# Patient Record
Sex: Male | Born: 1960
Health system: Southern US, Community
[De-identification: ages and names within clinical notes are randomized; demographics above are authoritative.]

## PROBLEM LIST (undated history)

## (undated) DIAGNOSIS — R0602 Shortness of breath: Secondary | ICD-10-CM

## (undated) DIAGNOSIS — E785 Hyperlipidemia, unspecified: Secondary | ICD-10-CM

## (undated) DIAGNOSIS — T8859XA Other complications of anesthesia, initial encounter: Secondary | ICD-10-CM

## (undated) DIAGNOSIS — N189 Chronic kidney disease, unspecified: Secondary | ICD-10-CM

## (undated) DIAGNOSIS — C8590 Non-Hodgkin lymphoma, unspecified, unspecified site: Secondary | ICD-10-CM

## (undated) DIAGNOSIS — F419 Anxiety disorder, unspecified: Secondary | ICD-10-CM

## (undated) DIAGNOSIS — D649 Anemia, unspecified: Secondary | ICD-10-CM

## (undated) DIAGNOSIS — C801 Malignant (primary) neoplasm, unspecified: Secondary | ICD-10-CM

## (undated) DIAGNOSIS — K219 Gastro-esophageal reflux disease without esophagitis: Secondary | ICD-10-CM

## (undated) DIAGNOSIS — F40240 Claustrophobia: Secondary | ICD-10-CM

## (undated) DIAGNOSIS — G4730 Sleep apnea, unspecified: Secondary | ICD-10-CM

## (undated) DIAGNOSIS — I509 Heart failure, unspecified: Secondary | ICD-10-CM

## (undated) DIAGNOSIS — I5022 Chronic systolic (congestive) heart failure: Secondary | ICD-10-CM

## (undated) DIAGNOSIS — E042 Nontoxic multinodular goiter: Secondary | ICD-10-CM

## (undated) DIAGNOSIS — I10 Essential (primary) hypertension: Secondary | ICD-10-CM

## (undated) DIAGNOSIS — I428 Other cardiomyopathies: Secondary | ICD-10-CM

## (undated) DIAGNOSIS — M1990 Unspecified osteoarthritis, unspecified site: Secondary | ICD-10-CM

## (undated) HISTORY — PX: WISDOM TOOTH EXTRACTION: SHX21

## (undated) SURGERY — Surgical Case
Anesthesia: *Unknown

---

## 2001-03-22 DIAGNOSIS — I509 Heart failure, unspecified: Secondary | ICD-10-CM | POA: Insufficient documentation

## 2001-03-22 HISTORY — DX: Heart failure, unspecified: I50.9

## 2001-12-01 ENCOUNTER — Encounter: Payer: Self-pay | Admitting: Internal Medicine

## 2001-12-01 ENCOUNTER — Encounter: Admission: RE | Admit: 2001-12-01 | Discharge: 2001-12-01 | Payer: Self-pay | Admitting: Internal Medicine

## 2001-12-11 ENCOUNTER — Encounter: Payer: Self-pay | Admitting: Internal Medicine

## 2001-12-11 ENCOUNTER — Encounter: Admission: RE | Admit: 2001-12-11 | Discharge: 2001-12-11 | Payer: Self-pay | Admitting: Internal Medicine

## 2002-01-23 ENCOUNTER — Ambulatory Visit (HOSPITAL_COMMUNITY): Admission: RE | Admit: 2002-01-23 | Discharge: 2002-01-23 | Payer: Self-pay | Admitting: *Deleted

## 2002-03-05 ENCOUNTER — Ambulatory Visit (HOSPITAL_BASED_OUTPATIENT_CLINIC_OR_DEPARTMENT_OTHER): Admission: RE | Admit: 2002-03-05 | Discharge: 2002-03-05 | Payer: Self-pay | Admitting: Pulmonary Disease

## 2002-03-19 ENCOUNTER — Encounter (HOSPITAL_COMMUNITY): Admission: RE | Admit: 2002-03-19 | Discharge: 2002-06-17 | Payer: Self-pay | Admitting: Internal Medicine

## 2002-03-20 ENCOUNTER — Encounter: Payer: Self-pay | Admitting: Internal Medicine

## 2002-03-26 ENCOUNTER — Encounter: Admission: RE | Admit: 2002-03-26 | Discharge: 2002-06-24 | Payer: Self-pay | Admitting: Internal Medicine

## 2002-04-10 ENCOUNTER — Encounter: Admission: RE | Admit: 2002-04-10 | Discharge: 2002-04-10 | Payer: Self-pay | Admitting: Internal Medicine

## 2002-04-10 ENCOUNTER — Encounter: Payer: Self-pay | Admitting: Internal Medicine

## 2002-05-08 ENCOUNTER — Encounter (INDEPENDENT_AMBULATORY_CARE_PROVIDER_SITE_OTHER): Payer: Self-pay | Admitting: *Deleted

## 2002-05-08 ENCOUNTER — Encounter: Payer: Self-pay | Admitting: Endocrinology

## 2002-05-08 ENCOUNTER — Ambulatory Visit (HOSPITAL_COMMUNITY): Admission: RE | Admit: 2002-05-08 | Discharge: 2002-05-08 | Payer: Self-pay | Admitting: Endocrinology

## 2002-10-22 ENCOUNTER — Encounter: Payer: Self-pay | Admitting: Endocrinology

## 2002-10-22 ENCOUNTER — Ambulatory Visit (HOSPITAL_COMMUNITY): Admission: RE | Admit: 2002-10-22 | Discharge: 2002-10-22 | Payer: Self-pay | Admitting: Endocrinology

## 2005-09-06 ENCOUNTER — Encounter: Admission: RE | Admit: 2005-09-06 | Discharge: 2005-09-06 | Payer: Self-pay | Admitting: Internal Medicine

## 2005-09-10 ENCOUNTER — Ambulatory Visit (HOSPITAL_COMMUNITY): Admission: RE | Admit: 2005-09-10 | Discharge: 2005-09-10 | Payer: Self-pay | Admitting: Gastroenterology

## 2005-09-10 ENCOUNTER — Encounter (INDEPENDENT_AMBULATORY_CARE_PROVIDER_SITE_OTHER): Payer: Self-pay | Admitting: *Deleted

## 2005-09-16 ENCOUNTER — Ambulatory Visit: Payer: Self-pay | Admitting: Hematology and Oncology

## 2005-09-24 LAB — CBC WITH DIFFERENTIAL/PLATELET
EOS%: 3.6 % (ref 0.0–7.0)
LYMPH%: 16.4 % (ref 14.0–48.0)
MCH: 26 pg — ABNORMAL LOW (ref 28.0–33.4)
MCHC: 33.2 g/dL (ref 32.0–35.9)
MCV: 78.2 fL — ABNORMAL LOW (ref 81.6–98.0)
MONO%: 15.1 % — ABNORMAL HIGH (ref 0.0–13.0)
Platelets: 450 10*3/uL — ABNORMAL HIGH (ref 145–400)
RBC: 4.2 10*6/uL (ref 4.20–5.71)
RDW: 13.7 % (ref 11.2–14.6)

## 2005-09-24 LAB — COMPREHENSIVE METABOLIC PANEL
AST: 17 U/L (ref 0–37)
Albumin: 4.3 g/dL (ref 3.5–5.2)
Alkaline Phosphatase: 37 U/L — ABNORMAL LOW (ref 39–117)
BUN: 40 mg/dL — ABNORMAL HIGH (ref 6–23)
Potassium: 5.7 mEq/L — ABNORMAL HIGH (ref 3.5–5.3)
Sodium: 139 mEq/L (ref 135–145)
Total Bilirubin: 0.3 mg/dL (ref 0.3–1.2)
Total Protein: 7.8 g/dL (ref 6.0–8.3)

## 2005-09-24 LAB — LACTATE DEHYDROGENASE: LDH: 453 U/L — ABNORMAL HIGH (ref 94–250)

## 2005-09-30 ENCOUNTER — Ambulatory Visit (HOSPITAL_COMMUNITY): Admission: RE | Admit: 2005-09-30 | Discharge: 2005-09-30 | Payer: Self-pay | Admitting: Hematology and Oncology

## 2005-10-01 ENCOUNTER — Other Ambulatory Visit: Admission: RE | Admit: 2005-10-01 | Discharge: 2005-10-01 | Payer: Self-pay | Admitting: Hematology and Oncology

## 2005-10-01 ENCOUNTER — Encounter: Payer: Self-pay | Admitting: Hematology and Oncology

## 2005-10-01 ENCOUNTER — Encounter (INDEPENDENT_AMBULATORY_CARE_PROVIDER_SITE_OTHER): Payer: Self-pay | Admitting: Specialist

## 2005-10-02 LAB — HIV ANTIBODY (ROUTINE TESTING W REFLEX)

## 2005-10-02 LAB — URIC ACID: Uric Acid, Serum: 9.4 mg/dL — ABNORMAL HIGH (ref 2.4–7.0)

## 2005-10-04 ENCOUNTER — Ambulatory Visit (HOSPITAL_COMMUNITY): Admission: RE | Admit: 2005-10-04 | Discharge: 2005-10-04 | Payer: Self-pay | Admitting: Urology

## 2005-10-05 ENCOUNTER — Encounter (INDEPENDENT_AMBULATORY_CARE_PROVIDER_SITE_OTHER): Payer: Self-pay | Admitting: Cardiology

## 2005-10-05 ENCOUNTER — Ambulatory Visit: Admission: RE | Admit: 2005-10-05 | Discharge: 2005-10-05 | Payer: Self-pay | Admitting: Hematology and Oncology

## 2005-10-06 ENCOUNTER — Encounter (INDEPENDENT_AMBULATORY_CARE_PROVIDER_SITE_OTHER): Payer: Self-pay | Admitting: Specialist

## 2005-10-06 ENCOUNTER — Ambulatory Visit (HOSPITAL_COMMUNITY): Admission: RE | Admit: 2005-10-06 | Discharge: 2005-10-06 | Payer: Self-pay | Admitting: Hematology and Oncology

## 2005-10-06 ENCOUNTER — Encounter: Payer: Self-pay | Admitting: Hematology and Oncology

## 2005-10-08 ENCOUNTER — Ambulatory Visit (HOSPITAL_COMMUNITY): Admission: RE | Admit: 2005-10-08 | Discharge: 2005-10-08 | Payer: Self-pay | Admitting: Urology

## 2005-10-08 LAB — COMPREHENSIVE METABOLIC PANEL
ALT: 18 U/L (ref 0–40)
AST: 19 U/L (ref 0–37)
Albumin: 3.9 g/dL (ref 3.5–5.2)
BUN: 17 mg/dL (ref 6–23)
CO2: 22 mEq/L (ref 19–32)
Calcium: 9.8 mg/dL (ref 8.4–10.5)
Chloride: 99 mEq/L (ref 96–112)
Creatinine, Ser: 1.31 mg/dL (ref 0.40–1.50)
Potassium: 4.9 mEq/L (ref 3.5–5.3)

## 2005-10-08 LAB — CBC WITH DIFFERENTIAL/PLATELET
Basophils Absolute: 0 10*3/uL (ref 0.0–0.1)
EOS%: 1.8 % (ref 0.0–7.0)
HCT: 30.8 % — ABNORMAL LOW (ref 38.7–49.9)
HGB: 10.2 g/dL — ABNORMAL LOW (ref 13.0–17.1)
MONO#: 0.9 10*3/uL (ref 0.1–0.9)
NEUT#: 5.7 10*3/uL (ref 1.5–6.5)
NEUT%: 72.8 % (ref 40.0–75.0)
RDW: 14.2 % (ref 11.2–14.6)
WBC: 7.9 10*3/uL (ref 4.0–10.0)
lymph#: 1 10*3/uL (ref 0.9–3.3)

## 2005-10-08 LAB — LACTATE DEHYDROGENASE: LDH: 439 U/L — ABNORMAL HIGH (ref 94–250)

## 2005-11-01 ENCOUNTER — Ambulatory Visit: Payer: Self-pay | Admitting: Hematology and Oncology

## 2005-11-01 LAB — CBC WITH DIFFERENTIAL/PLATELET
Basophils Absolute: 0 10*3/uL (ref 0.0–0.1)
Eosinophils Absolute: 0.1 10*3/uL (ref 0.0–0.5)
HCT: 28.3 % — ABNORMAL LOW (ref 38.7–49.9)
HGB: 9.3 g/dL — ABNORMAL LOW (ref 13.0–17.1)
MCH: 25.4 pg — ABNORMAL LOW (ref 28.0–33.4)
MCV: 76.8 fL — ABNORMAL LOW (ref 81.6–98.0)
NEUT#: 8.7 10*3/uL — ABNORMAL HIGH (ref 1.5–6.5)
NEUT%: 74.3 % (ref 40.0–75.0)
RDW: 15.3 % — ABNORMAL HIGH (ref 11.2–14.6)
lymph#: 1.8 10*3/uL (ref 0.9–3.3)

## 2005-11-01 LAB — COMPREHENSIVE METABOLIC PANEL
Albumin: 4 g/dL (ref 3.5–5.2)
Alkaline Phosphatase: 65 U/L (ref 39–117)
BUN: 12 mg/dL (ref 6–23)
CO2: 24 mEq/L (ref 19–32)
Calcium: 8.6 mg/dL (ref 8.4–10.5)
Chloride: 105 mEq/L (ref 96–112)
Glucose, Bld: 127 mg/dL — ABNORMAL HIGH (ref 70–99)
Potassium: 4.2 mEq/L (ref 3.5–5.3)
Sodium: 138 mEq/L (ref 135–145)
Total Protein: 6.8 g/dL (ref 6.0–8.3)

## 2005-11-23 LAB — COMPREHENSIVE METABOLIC PANEL
AST: 17 U/L (ref 0–37)
Albumin: 4.2 g/dL (ref 3.5–5.2)
Alkaline Phosphatase: 65 U/L (ref 39–117)
BUN: 11 mg/dL (ref 6–23)
Calcium: 9 mg/dL (ref 8.4–10.5)
Chloride: 108 mEq/L (ref 96–112)
Glucose, Bld: 145 mg/dL — ABNORMAL HIGH (ref 70–99)
Potassium: 3.9 mEq/L (ref 3.5–5.3)
Sodium: 141 mEq/L (ref 135–145)
Total Protein: 6.9 g/dL (ref 6.0–8.3)

## 2005-11-23 LAB — CBC WITH DIFFERENTIAL/PLATELET
Basophils Absolute: 0.1 10*3/uL (ref 0.0–0.1)
Eosinophils Absolute: 0.1 10*3/uL (ref 0.0–0.5)
HGB: 9.3 g/dL — ABNORMAL LOW (ref 13.0–17.1)
NEUT#: 5.4 10*3/uL (ref 1.5–6.5)
RBC: 3.51 10*6/uL — ABNORMAL LOW (ref 4.20–5.71)
RDW: 18.2 % — ABNORMAL HIGH (ref 11.2–14.6)
WBC: 8.1 10*3/uL (ref 4.0–10.0)
lymph#: 1.5 10*3/uL (ref 0.9–3.3)

## 2005-12-06 LAB — CBC WITH DIFFERENTIAL/PLATELET
Basophils Absolute: 0 10*3/uL (ref 0.0–0.1)
EOS%: 0.2 % (ref 0.0–7.0)
HGB: 9.8 g/dL — ABNORMAL LOW (ref 13.0–17.1)
MCH: 26.7 pg — ABNORMAL LOW (ref 28.0–33.4)
MCV: 79.3 fL — ABNORMAL LOW (ref 81.6–98.0)
MONO%: 1.8 % (ref 0.0–13.0)
NEUT#: 11.9 10*3/uL — ABNORMAL HIGH (ref 1.5–6.5)
RBC: 3.65 10*6/uL — ABNORMAL LOW (ref 4.20–5.71)
RDW: 21.8 % — ABNORMAL HIGH (ref 11.2–14.6)
lymph#: 0.9 10*3/uL (ref 0.9–3.3)

## 2005-12-06 LAB — BASIC METABOLIC PANEL
Chloride: 104 mEq/L (ref 96–112)
Potassium: 5 mEq/L (ref 3.5–5.3)
Sodium: 137 mEq/L (ref 135–145)

## 2005-12-06 LAB — MAGNESIUM: Magnesium: 2.2 mg/dL (ref 1.5–2.5)

## 2005-12-13 LAB — CBC WITH DIFFERENTIAL/PLATELET
BASO%: 0.6 % (ref 0.0–2.0)
Basophils Absolute: 0 10*3/uL (ref 0.0–0.1)
Eosinophils Absolute: 0.1 10*3/uL (ref 0.0–0.5)
HCT: 29 % — ABNORMAL LOW (ref 38.7–49.9)
HGB: 9.6 g/dL — ABNORMAL LOW (ref 13.0–17.1)
LYMPH%: 25.6 % (ref 14.0–48.0)
MCHC: 33.3 g/dL (ref 32.0–35.9)
MONO#: 1.1 10*3/uL — ABNORMAL HIGH (ref 0.1–0.9)
NEUT%: 54.1 % (ref 40.0–75.0)
Platelets: 192 10*3/uL (ref 145–400)
WBC: 5.9 10*3/uL (ref 4.0–10.0)

## 2005-12-13 LAB — BASIC METABOLIC PANEL
BUN: 20 mg/dL (ref 6–23)
CO2: 26 mEq/L (ref 19–32)
Calcium: 9.4 mg/dL (ref 8.4–10.5)
Creatinine, Ser: 1.35 mg/dL (ref 0.40–1.50)
Glucose, Bld: 102 mg/dL — ABNORMAL HIGH (ref 70–99)

## 2005-12-22 ENCOUNTER — Ambulatory Visit: Payer: Self-pay | Admitting: Hematology and Oncology

## 2006-01-03 LAB — COMPREHENSIVE METABOLIC PANEL
ALT: 20 U/L (ref 0–40)
AST: 16 U/L (ref 0–37)
BUN: 16 mg/dL (ref 6–23)
Calcium: 8.8 mg/dL (ref 8.4–10.5)
Chloride: 105 mEq/L (ref 96–112)
Creatinine, Ser: 1.45 mg/dL (ref 0.40–1.50)
Total Bilirubin: 0.2 mg/dL — ABNORMAL LOW (ref 0.3–1.2)

## 2006-01-03 LAB — MAGNESIUM: Magnesium: 1.4 mg/dL — ABNORMAL LOW (ref 1.5–2.5)

## 2006-01-21 LAB — BASIC METABOLIC PANEL
CO2: 21 mEq/L (ref 19–32)
Potassium: 4.8 mEq/L (ref 3.5–5.3)
Sodium: 134 mEq/L — ABNORMAL LOW (ref 135–145)

## 2006-01-21 LAB — URINALYSIS, MICROSCOPIC - CHCC
Ketones: NEGATIVE mg/dL
Protein: 30 mg/dL
pH: 6 (ref 4.6–8.0)

## 2006-01-24 LAB — CBC WITH DIFFERENTIAL/PLATELET
HCT: 25.9 % — ABNORMAL LOW (ref 38.7–49.9)
MONO#: 0.8 10*3/uL (ref 0.1–0.9)
MONO%: 15.9 % — ABNORMAL HIGH (ref 0.0–13.0)
NEUT%: 60.3 % (ref 40.0–75.0)
Platelets: 107 10*3/uL — ABNORMAL LOW (ref 145–400)
RBC: 3 10*6/uL — ABNORMAL LOW (ref 4.20–5.71)
WBC: 4.7 10*3/uL (ref 4.0–10.0)
lymph#: 1.1 10*3/uL (ref 0.9–3.3)

## 2006-01-24 LAB — BASIC METABOLIC PANEL
BUN: 15 mg/dL (ref 6–23)
Calcium: 8.8 mg/dL (ref 8.4–10.5)
Creatinine, Ser: 1.43 mg/dL (ref 0.40–1.50)

## 2006-01-24 LAB — URINE SODIUM & POTASSIUM: Potassium Urine: 24 mEq/L

## 2006-02-02 ENCOUNTER — Ambulatory Visit: Payer: Self-pay | Admitting: Hematology and Oncology

## 2006-02-07 LAB — CBC WITH DIFFERENTIAL/PLATELET
BASO%: 0.1 % (ref 0.0–2.0)
Basophils Absolute: 0 10*3/uL (ref 0.0–0.1)
HCT: 28.2 % — ABNORMAL LOW (ref 38.7–49.9)
LYMPH%: 9.3 % — ABNORMAL LOW (ref 14.0–48.0)
MCH: 29.6 pg (ref 28.0–33.4)
MCV: 87.9 fL (ref 81.6–98.0)
MONO#: 0.2 10*3/uL (ref 0.1–0.9)
MONO%: 1.8 % (ref 0.0–13.0)
NEUT%: 88.6 % — ABNORMAL HIGH (ref 40.0–75.0)

## 2006-02-07 LAB — BASIC METABOLIC PANEL
CO2: 17 mEq/L — ABNORMAL LOW (ref 19–32)
Glucose, Bld: 129 mg/dL — ABNORMAL HIGH (ref 70–99)
Potassium: 4.7 mEq/L (ref 3.5–5.3)
Sodium: 135 mEq/L (ref 135–145)

## 2006-02-11 LAB — CBC WITH DIFFERENTIAL/PLATELET
BASO%: 0.5 % (ref 0.0–2.0)
Basophils Absolute: 0 10*3/uL (ref 0.0–0.1)
EOS%: 0.3 % (ref 0.0–7.0)
HCT: 26.5 % — ABNORMAL LOW (ref 38.7–49.9)
HGB: 8.9 g/dL — ABNORMAL LOW (ref 13.0–17.1)
LYMPH%: 14.1 % (ref 14.0–48.0)
MCH: 29.4 pg (ref 28.0–33.4)
MCHC: 33.5 g/dL (ref 32.0–35.9)
MCV: 87.9 fL (ref 81.6–98.0)
NEUT%: 75.2 % — ABNORMAL HIGH (ref 40.0–75.0)
Platelets: 131 10*3/uL — ABNORMAL LOW (ref 145–400)

## 2006-02-21 LAB — CBC WITH DIFFERENTIAL/PLATELET
Basophils Absolute: 0 10*3/uL (ref 0.0–0.1)
MCH: 30.4 pg (ref 28.0–33.4)
MCV: 90.4 fL (ref 81.6–98.0)
MONO%: 15.2 % — ABNORMAL HIGH (ref 0.0–13.0)
NEUT%: 70.3 % (ref 40.0–75.0)
RBC: 2.95 10*6/uL — ABNORMAL LOW (ref 4.20–5.71)
lymph#: 1 10*3/uL (ref 0.9–3.3)

## 2006-02-21 LAB — BASIC METABOLIC PANEL
BUN: 21 mg/dL (ref 6–23)
Chloride: 104 mEq/L (ref 96–112)
Glucose, Bld: 114 mg/dL — ABNORMAL HIGH (ref 70–99)

## 2006-04-06 ENCOUNTER — Ambulatory Visit: Payer: Self-pay | Admitting: Hematology and Oncology

## 2006-07-04 ENCOUNTER — Ambulatory Visit: Payer: Self-pay | Admitting: Hematology and Oncology

## 2006-10-12 ENCOUNTER — Ambulatory Visit: Payer: Self-pay | Admitting: Hematology and Oncology

## 2006-10-20 ENCOUNTER — Ambulatory Visit (HOSPITAL_COMMUNITY): Admission: RE | Admit: 2006-10-20 | Discharge: 2006-10-20 | Payer: Self-pay | Admitting: Internal Medicine

## 2006-12-30 ENCOUNTER — Ambulatory Visit: Payer: Self-pay | Admitting: Hematology and Oncology

## 2007-02-21 ENCOUNTER — Ambulatory Visit: Payer: Self-pay | Admitting: Hematology and Oncology

## 2007-06-06 ENCOUNTER — Ambulatory Visit: Payer: Self-pay | Admitting: Hematology and Oncology

## 2007-07-17 ENCOUNTER — Ambulatory Visit: Payer: Self-pay | Admitting: Hematology and Oncology

## 2007-08-28 ENCOUNTER — Ambulatory Visit: Payer: Self-pay | Admitting: Hematology and Oncology

## 2007-10-09 ENCOUNTER — Ambulatory Visit: Payer: Self-pay | Admitting: Hematology and Oncology

## 2007-10-10 IMAGING — CT CT CHEST W/ CM
1 of 12 series · 12 of 37 positions shown, 15 images · IV contrast (omnipaque)
Comparison: None
COMPARISON: None
COMPARISON: Today's neck and chest CT. CT of the abdomen and pelvis from
09/06/2005.

CLINICAL DATA: B-cell lymphoma

NECK CT WITH CONTRAST
TECHNIQUE: Multidetector CT imaging of the neck was performed following the
standard protocol during administration of intravenous contrast.
Contrast:  125 cc Omnipaque 300
TECHNIQUE: Multidetector CT imaging of the chest was performed following the
standard protocol during bolus administration of intravenous contrast.
TECHNIQUE: 17.6 mCi F-18 FDG was injected intravenously via the right
antecubital fossa .  Full-ring PET imaging was performed from the skull base
through the mid-thighs 60 minutes after injection.  CT data was obtained and
used for attenuation correction and anatomic localization only.  (This was not
acquired as a diagnostic CT examination.)

[Series 8: thin sctions 2.0 b40f st · axial · 0.86mm/px · z∈[-536,-197]mm · 12 of 573 slices shown, 15 images]
[im 45/573  mediastinal]
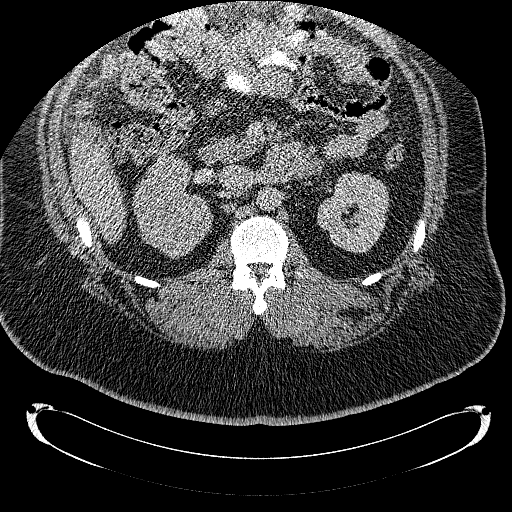
[im 45/573  lung]
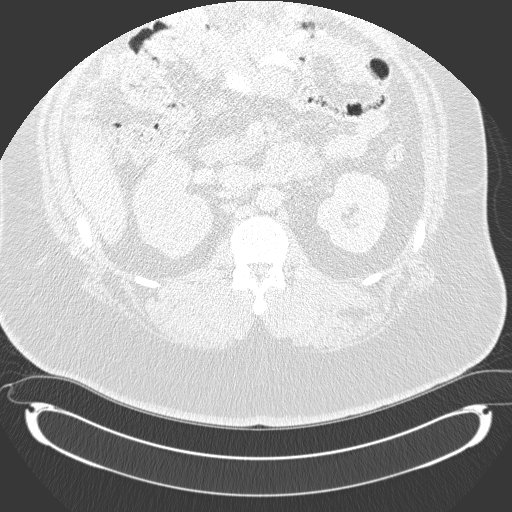
[im 89/573  lung]
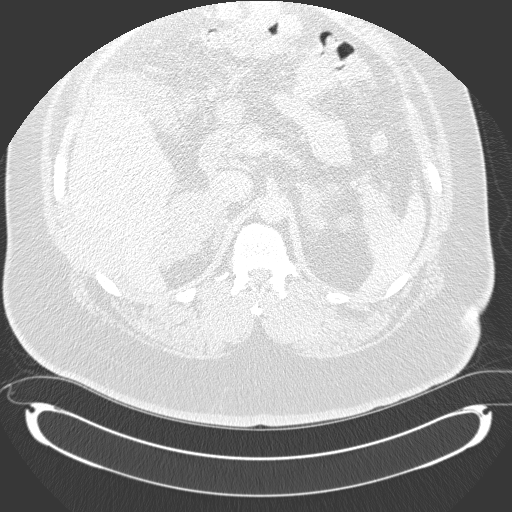
[im 133/573  lung]
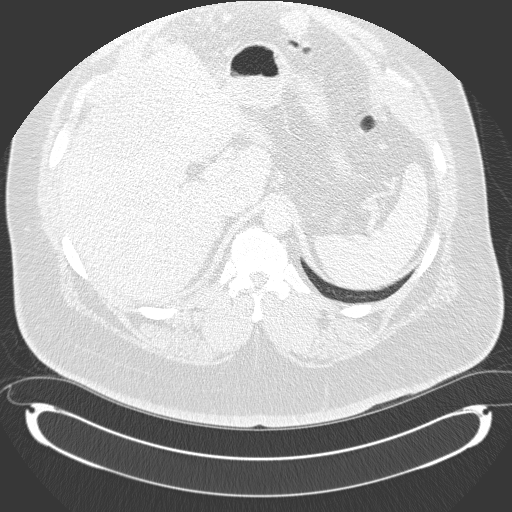
[im 177/573  lung]
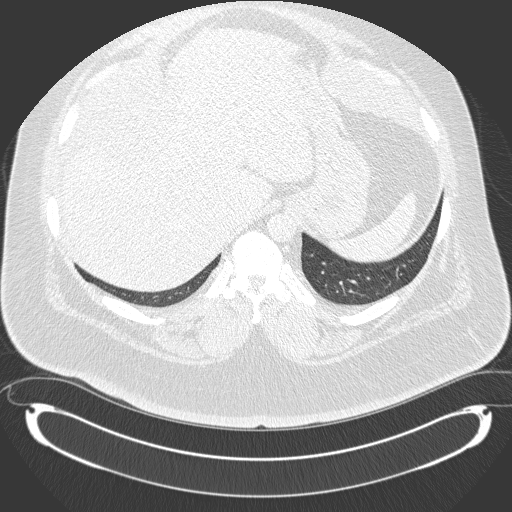
[im 221/573  mediastinal]
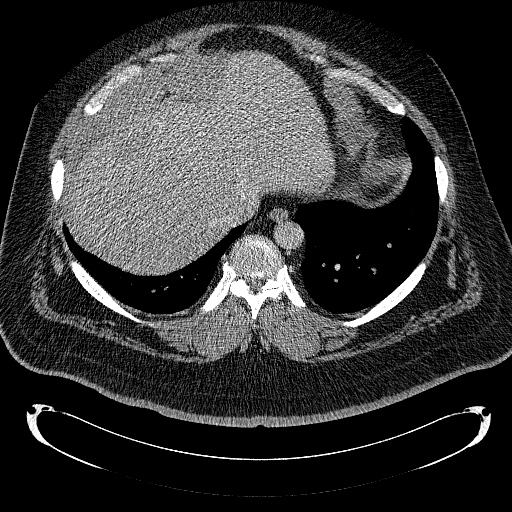
[im 221/573  lung]
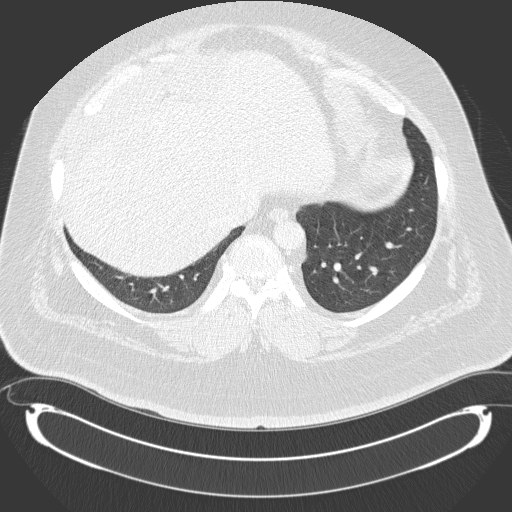
[im 265/573  lung]
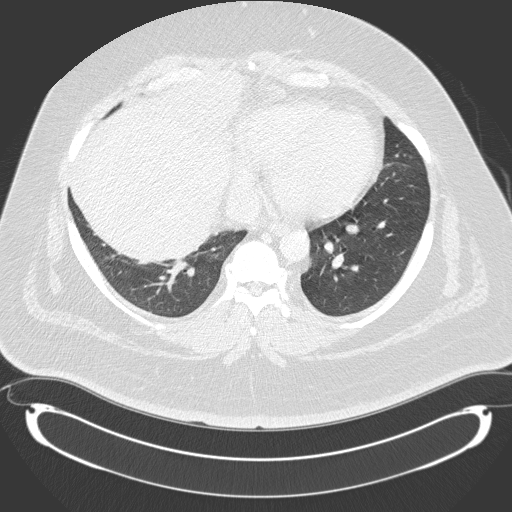
[im 309/573  lung]
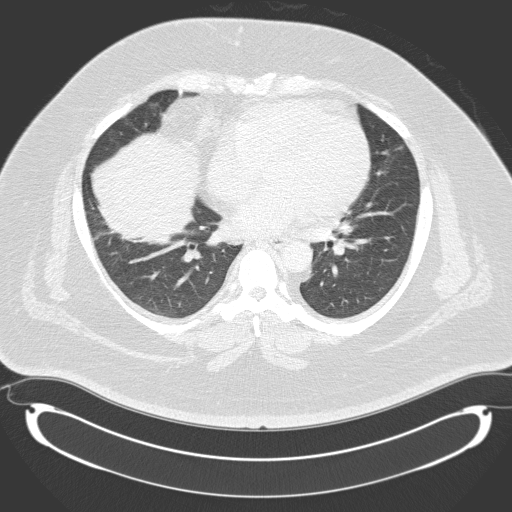
[im 353/573  lung]
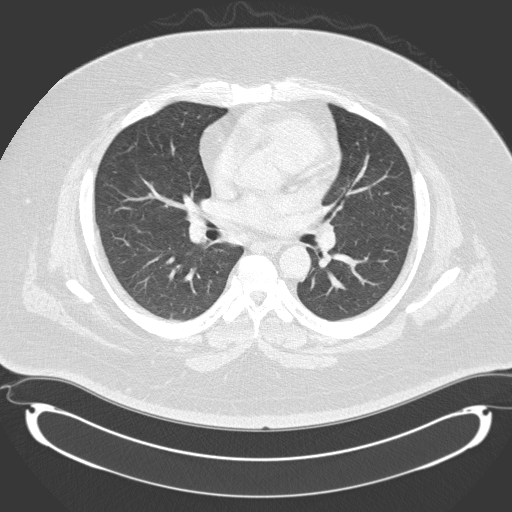
[im 397/573  mediastinal]
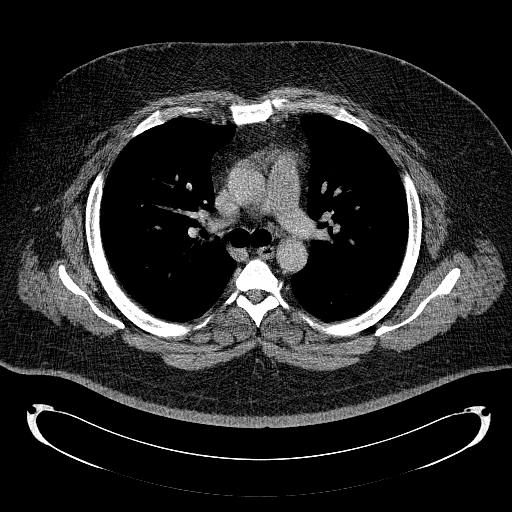
[im 397/573  lung]
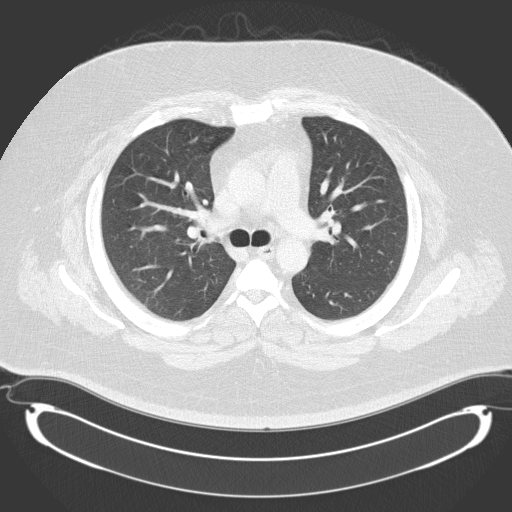
[im 441/573  lung]
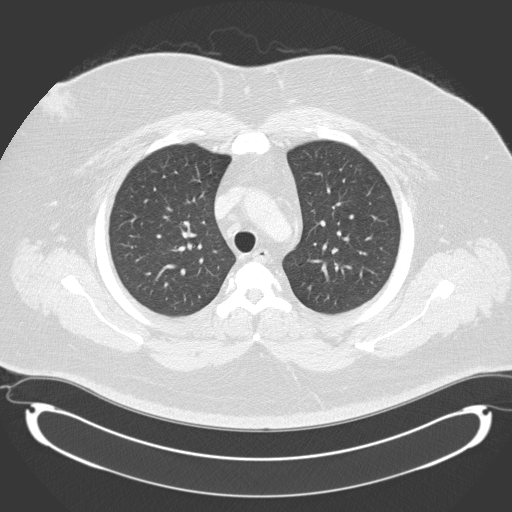
[im 485/573  lung]
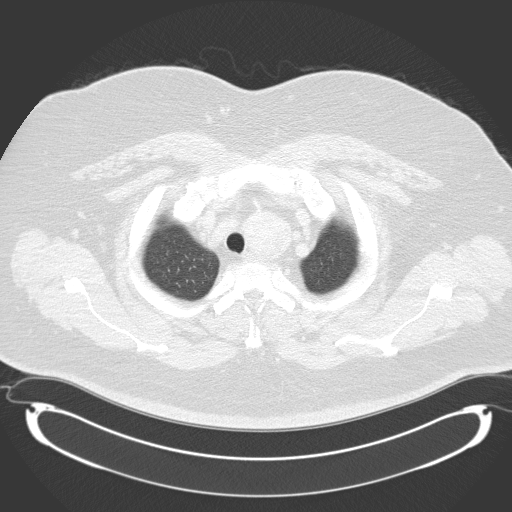
[im 529/573  lung]
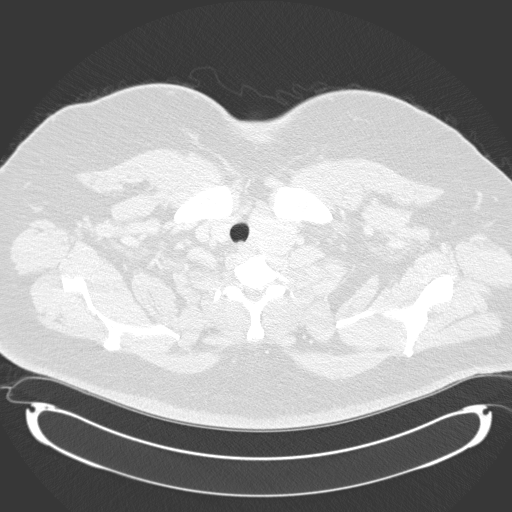

[12 of 37 positions shown; findings below may reference images not displayed]

FINDINGS: There are small subcentimeteric lymph node scattered throughout the
neck. These showed no FDG accumulation on today at PET-CT. No adenopathy. The
left thyroid lobe is enlarged and extends substernally. The left thyroid lobe
appears very heterogeneous and there may be small nodules present. See PET-CT
report below for further discussion.

There is mild mucosal disease in the maxillary sinuses bilaterally. No air-fluid
levels present. Vascular structures unremarkable. Visualized skeletal structures
unremarkable.

IMPRESSION

No evidence of cervical adenopathy.

Enlarged left lobe of the thyroid gland which extends substernally. The gland is
very heterogeneous and there may be small nodules throughout the left lobe. See
PET-CT report below for further discussion.

CHEST CT WITH CONTRAST
FINDINGS: Again, the enlarged left lobe the thyroid is noted, extending
substernally. Small mediastinal lymph nodes present, none of which are
pathologically enlarged. No mediastinal, hilar, or axillary adenopathy. Heart
and great vessels are normal size and anatomic configuration. There is right
basilar atelectasis. No suspicious pulmonary nodules. No pleural or pericardial
effusion.

Again noted is the peritoneal disease around the liver and in the upper abdomen,
as noted on prior abdominal CT.

IMPRESSION

No evidence of adenopathy in the chest.

Enlarged, heterogenous left lobe of the thyroid extending substernally.

Extensive peritoneal disease in the upper abdomen as seen on prior abdominal CT.

FDG PET-CT TUMOR IMAGING (SKULL BASE TO THIGHS):

Fasting Blood Glucose:  108
FINDINGS: There is extensive abnormal FDG uptake throughout the peritoneal
disease in the abdomen and pelvis. It is very difficult to separate peritoneal
disease from bowel. Index maximum SUVs in the largest mass in the right lower
quadrant is 28.1, and within the in the largest serosal mass overlying the right
lobe of the liver is 27.2. This abnormal peritoneal disease and FDG uptake is
seen throughout the entire abdomen and pelvis.

There is no abnormal nodal activity seen in the neck or chest. There is a small
focus of abnormal FDG accumulation posteriorly in the enlarged left thyroid lobe
near the sternal notch. Maximum SUV in this area is 4.4.
IMPRESSION: Extensive peritoneal disease throughout the entire abdomen and pelvis. There are
large serosal implants along the liver surface. All of these areas show marked
abnormal FDG accumulation compatible with patient's history of lymphoma.

A small focus of increased activity posteriorly in the enlarged left thyroid
lobe at the level of the sternal notch. This can be correlated with ultrasound,
but given its small size, the enlargement of the left thyroid lobe, and its
substernal location, this may be difficult to localize for sampling.

## 2007-11-20 ENCOUNTER — Ambulatory Visit: Payer: Self-pay | Admitting: Hematology and Oncology

## 2010-08-07 NOTE — Cardiovascular Report (Signed)
NAME:  [name] Sosa, [name] Sosa                       ACCOUNT NO.:  [id number]   MEDICAL RECORD NO.:  [id number]                   PATIENT TYPE:  OIB   LOCATION:  2855                                 FACILITY:  [facility]   PHYSICIAN:  Meade Maw, M.D.                 DATE OF BIRTH:  [birth year]   DATE OF PROCEDURE:  01/23/2002  DATE OF DISCHARGE:                              CARDIAC CATHETERIZATION   INDICATIONS FOR PROCEDURE:  Cardiomyopathy.   DESCRIPTION OF PROCEDURE:  After obtaining written informed consent, the  patient was brought to the cardiac catheterization laboratory in the  postabsorptive state. Preoperative sedation was achieved using IV Versed.  The right groin was prepped and draped in the usual sterile fashion. Local  anesthesia was achieved using 1% Xylocaine. An 8 [place] hemostasis sheath  was placed into the right femoral vein using the modified Seldinger  technique.  A 6 French hemostasis sheath was placed into the right femoral  artery using the modified Seldinger technique.  Right heart catheterization  was performed.  Thermodilution cardiac outputs were obtained.  Fick cardiac  outputs were obtained.  Coronary angiography was performed using a JL4, JR4  Judkins catheter.  Single plane ventriculogram was performed using a 6  French pigtail catheter. All catheter exchange were made over a guide wire.  The hemostasis sheaths were flushed following each engagement.  After review  of the films there was no identifiable coronary artery disease.  The patient  was transferred to the holding area. Hemostasis was achieved using digital  pressure.   FINDINGS:  The aortic pressure was 103/81, LV pressure is 104/12 with an EDP  of 20.  The right atrial main was 7, the right ventricle pressure was 41/6.  PA pressure was 40/21.  Wedge was 20.  O2 saturations:  Aortic saturation  was 94%.  The pulmonary artery saturation was 69%.  The Fick cardiac output  was 7.8, Fick cardiac  index was 2.8.  Thermodilution cardiac output was 7.9,  cardiac index of 2.9.  Single plane ventriculogram revealed severely diffuse  hypokinesis.  The ejection fraction was 35-40%.  There was mild mitral  regurgitation noted.  There was marked left ventricular enlargement noted.   CORONARY ANGIOGRAPHY:  Left main coronary artery:  The left main coronary artery bifurcates into  the left anterior descending and circumflex vessel.  There is no significant  disease into the left main coronary artery.   Left anterior descending:  The left anterior descending is a large artery,  gives rise to a small diagonal #1, moderate sized diagonal #2, small  diagonal #3.  There is no disease in the left anterior descending or its  branches.   Circumflex vessel:  The circumflex vessel is also a large vessel giving rise  to three RV marginals.  There is no disease in the circumflex or its  branches.   Right coronary artery:  The right coronary  artery is a large dominant artery  giving rise to several RV marginals and a PDA. There is no disease in the  right coronary artery or its branches.   FINAL IMPRESSION:  A nonischemic cardiomyopathy.  The patient is currently  clinically compensated by his hemodynamics.  We will continue with Lasix at  40 mg daily, K-Dur 20 mEq.  His Coreg will be increased to 12.5.  The  patient was noted to have a resting heart rate in the 90s.    PLAN:  He will continue to follow in the Congestive Heart failure [facility].  His medications will be maximized. He currently is not a candidate for  cardiac transplant secondary to his morbid obesity.  Additionally, he  appears to be well compensated at this time.                                                 Meade Maw, M.D.    HP/MEDQ  D:  01/23/2002  T:  01/23/2002  Job:  [id number]   cc:   Thora Lance, M.D.  [address]  [place]  [place] [address]  Fax: [phone]

## 2010-08-07 NOTE — Op Note (Signed)
NAMEGAYLIN, [name] Sosa NO.:  [id number]   MEDICAL RECORD NO.:  [id number]          PATIENT TYPE:  AMB   LOCATION:  DAY                          [facility]ACILITY:  [facility]   PHYSICIAN:  Claudette Laws, M.D.  DATE OF BIRTH:  02-23-1961   DATE OF PROCEDURE:  10/04/2005  DATE OF DISCHARGE:                                 OPERATIVE REPORT   PREOPERATIVE DIAGNOSIS:  Right hydronephrosis secondary to diffuse  carcinomatosis.   POSTOP DIAGNOSIS:  Right hydronephrosis secondary to diffuse carcinomatosis.   OPERATION:  Cystoscopy right retrograde pyelogram, and attempted insertion  of a double-J stent.   SURGEON:  Claudette Laws, M.D.   HISTORY:  This is a 50 year old man recently diagnosed with diffuse  carcinomatosis and right hydronephrosis.  Prior to initiation of  chemotherapy.  Dr. Dalene Carrow, medical oncologist, estimated me to put up a  double-J stent.  There was some rise in his creatinine.  I explained the  procedure to him; and also the possibility that we might not be able to get  the stent up.  He understands and agrees to the proposed surgery.   PROCEDURE:  The patient was prepped and draped in the dorsolithotomy  position under intubated general anesthesia.  A cystoscopy was performed  with a 22-French rigid cystoscope and a normal anterior urethra, a small  prostate, normal appearing bladder.  No tumors, normal ureteral orifices.  No stones.   Initially I tried to pass up a 0.038 Glidewire.  We had some difficulty with  fluoroscopy unit, but eventually I was able to take some spot films; it  appeared that the Glidewire had passed up into the kidney.  However, I was  never able to pass up the open-ended catheter much beyond the intramural  ureter.  We tried different size stents.  I even exchanged the guidewire to  a sensor wire; but, again, it was very tight right in the intramural ureter.  I even switched to a 5-French double-J stent and could not advance it.  Rather than forcing the issue, I just elected to back out the guidewire.   I did take some x-rays; and he had what appeared to be a narrow ureter; and  also the dilated calyces as noted on his CT scan.   I emptied the bladder and put in a B&O suppository and he was taken back to  the recovery room in satisfactory condition.  I will ask interventional  radiology to see if they can do a percutaneous procedure on him now.      Claudette Laws, M.D.  Electronically Signed     RFS/MEDQ  D:  10/04/2005  T:  10/04/2005  Job:  [id number]

## 2011-02-05 ENCOUNTER — Encounter (HOSPITAL_COMMUNITY)
Admission: RE | Admit: 2011-02-05 | Discharge: 2011-02-05 | Disposition: A | Discharge status: Home / Self Care | Payer: Managed Care, Other (non HMO) | Source: Ambulatory Visit | Attending: Urology | Admitting: Urology

## 2011-02-05 ENCOUNTER — Other Ambulatory Visit: Payer: Self-pay

## 2011-02-05 ENCOUNTER — Ambulatory Visit (HOSPITAL_COMMUNITY)
Admission: RE | Admit: 2011-02-05 | Discharge: 2011-02-05 | Disposition: A | Discharge status: Home / Self Care | Payer: Managed Care, Other (non HMO) | Source: Ambulatory Visit | Attending: Urology | Admitting: Urology

## 2011-02-05 ENCOUNTER — Encounter (HOSPITAL_COMMUNITY): Payer: Self-pay

## 2011-02-05 DIAGNOSIS — C8589 Other specified types of non-Hodgkin lymphoma, extranodal and solid organ sites: Secondary | ICD-10-CM | POA: Insufficient documentation

## 2011-02-05 DIAGNOSIS — Z01818 Encounter for other preprocedural examination: Secondary | ICD-10-CM | POA: Insufficient documentation

## 2011-02-05 DIAGNOSIS — N433 Hydrocele, unspecified: Secondary | ICD-10-CM | POA: Insufficient documentation

## 2011-02-05 DIAGNOSIS — Z01812 Encounter for preprocedural laboratory examination: Secondary | ICD-10-CM | POA: Insufficient documentation

## 2011-02-05 HISTORY — DX: Hyperlipidemia, unspecified: E78.5

## 2011-02-05 HISTORY — DX: Sleep apnea, unspecified: G47.30

## 2011-02-05 HISTORY — DX: Anxiety disorder, unspecified: F41.9

## 2011-02-05 HISTORY — DX: Chronic kidney disease, unspecified: N18.9

## 2011-02-05 HISTORY — DX: Nontoxic multinodular goiter: E04.2

## 2011-02-05 HISTORY — DX: Gastro-esophageal reflux disease without esophagitis: K21.9

## 2011-02-05 HISTORY — DX: Shortness of breath: R06.02

## 2011-02-05 HISTORY — DX: Heart failure, unspecified: I50.9

## 2011-02-05 HISTORY — DX: Essential (primary) hypertension: I10

## 2011-02-05 HISTORY — DX: Other cardiomyopathies: I42.8

## 2011-02-05 HISTORY — DX: Malignant (primary) neoplasm, unspecified: C80.1

## 2011-02-05 HISTORY — DX: Non-Hodgkin lymphoma, unspecified, unspecified site: C85.90

## 2011-02-05 LAB — CBC
HCT: 37.4 % — ABNORMAL LOW (ref 39.0–52.0)
Hemoglobin: 11.7 g/dL — ABNORMAL LOW (ref 13.0–17.0)
MCH: 24.5 pg — ABNORMAL LOW (ref 26.0–34.0)
MCHC: 31.3 g/dL (ref 30.0–36.0)
MCV: 78.4 fL (ref 78.0–100.0)
RDW: 15.5 % (ref 11.5–15.5)

## 2011-02-05 LAB — BASIC METABOLIC PANEL
BUN: 20 mg/dL (ref 6–23)
Calcium: 10 mg/dL (ref 8.4–10.5)
Creatinine, Ser: 2.11 mg/dL — ABNORMAL HIGH (ref 0.50–1.35)
GFR calc Af Amer: 40 mL/min — ABNORMAL LOW (ref >90)
GFR calc non Af Amer: 34 mL/min — ABNORMAL LOW (ref >90)
Glucose, Bld: 86 mg/dL (ref 70–99)

## 2011-02-05 LAB — SURGICAL PCR SCREEN: Staphylococcus aureus: NEGATIVE

## 2011-02-05 NOTE — Patient Instructions (Signed)
20 [name] Sosa  02/05/2011   Your procedure is scheduled on: Tuesday 11/20  AT  7:15 AM  Report to [name] Sosa at 5:15 AM.  Call this number if you have problems the morning of surgery: [phone]   Remember:BRING CPAP MASK--JUST IN CASE IT IS NEEDED AFTER YOUR SURGERY ONLY TAKE 1/2 YOUR USUAL AMOUNT OF INSULIN THE NIGHT BEFORE YOUR SURGERY.  DO NOT TAKE ANY INSULIN OR DIABETIC MEDICATION THE AM OF YOUR SURGERY.   Do not eat food:After Midnight.  Do not drink clear liquids: After Midnight.  Take these medicines the morning of surgery with A SIP OF WATER: ALPRAZOLAM, CARVEDILOL, RABEPRAZOLE (ACIPHEX )   MAY USE FLONASE IF NEEDED   Do not wear jewelry, make-up or nail polish.  Do not wear lotions, powders, or perfumes. You may wear deodorant.  Do not shave 48 hours prior to surgery.  Do not bring valuables to the hospital.  Contacts, dentures or bridgework may not be worn into surgery.  Leave suitcase in the car. After surgery it may be brought to your room.  For patients admitted to the hospital, checkout time is 11:00 AM the day of discharge.   Patients discharged the day of surgery will not be allowed to drive home.  Name and phone number of your driver:   Special Instructions: CHG Shower Use Special Wash: 1/2 bottle night before surgery and 1/2 bottle morning of surgery.   Please read over the following fact sheets that you were given: MRSA Information

## 2011-02-08 NOTE — H&P (Addendum)
  History of Present Illness  F/u right scrotal swelling. Patient otherwise without complaint. Here to review scrotal US.   Past Medical History Problems  1. History of  Adult Sleep Apnea 780.57 2. History of  Anxiety (Symptom) 300.00 3. History of  Congestive Heart Failure 428.0 4. History of  Diabetes Mellitus 250.00 5. History of  Esophageal Reflux 530.81 6. History of  Non-Hodgkin's Lymphoma 202.80  Surgical History Problems  1. History of  No Surgical Problems  Current Meds 1. Aciphex 20 MG Oral Tablet Delayed Release; Therapy: (Recorded:28Aug2012) to 2. Altace 10 MG Oral Capsule; Therapy: (Recorded:28Aug2012) to 3. Coreg 25 MG Oral Tablet; Therapy: (Recorded:28Aug2012) to 4. Glimepiride TABS; Therapy: (Recorded:28Aug2012) to 5. HumaLOG SOLN; Therapy: (Recorded:28Aug2012) to 6. Iron TABS; Therapy: (Recorded:28Aug2012) to 7. Lantus SOLN; Therapy: (Recorded:28Aug2012) to 8. Omega-3 CAPS; Therapy: (Recorded:28Aug2012) to  Allergies Medication  1. No Known Drug Allergies  Family History Denied  1. Family history of  Family Health Status Number Of Children  Social History Problems  1. Caffeine Use 2 per day 2. Marital History - Single 3. Never A Smoker 4. Occupation: Acupuncturist  5. History of  Alcohol Use  Review of Systems  Constitutional: no fever and no night sweats.  Cardiovascular: no chest pain.  Respiratory: no shortness of breath.    Physical Exam Constitutional: Well nourished and well developed . No acute distress.  Pulmonary: No respiratory distress and normal respiratory rhythm and effort.  Cardiovascular: Heart rate and rhythm are normal . No peripheral edema.  Neuro/Psych:. Mood and affect are appropriate.    Results/Data Urine [Data Includes: Last 1 Day]  28Sep2012  COLOR: YELLOW  Reference Range YELLOW APPEARANCE: CLEAR  Reference Range CLEAR SPECIFIC GRAVITY: 1.020  Reference Range 1.005-1.030 pH: 6.0  Reference Range  5.0-8.0 GLUCOSE: 500 mg/dL Abnormal Reference Range NEG BILIRUBIN: NEG  Reference Range NEG KETONE: NEG mg/dL Reference Range NEG BLOOD: MOD  Abnormal Reference Range NEG PROTEIN: 30 mg/dL Abnormal Reference Range NEG UROBILINOGEN: 0.2 mg/dL Reference Range 1.6-1.0 NITRITE: NEG  Reference Range NEG LEUKOCYTE ESTERASE: NEG  Reference Range NEG SQUAMOUS EPITHELIAL/HPF: RARE  Reference Range RARE WBC: NONE SEEN WBC/hpf Reference Range <4 RBC: NONE SEEN RBC/hpf Reference Range <4 BACTERIA: NONE SEEN  Reference Range RARE CRYSTALS: NONE SEEN  Reference Range NEG CASTS: NONE SEEN  Reference Range NEG  Procedure  Scrotal US - see tech sheet for details - images reviewed - both testicles are normal and homogenous without mass. Large amount of hypoechoic area around right testicle c/w hydrocele. Smaller left hydrocele.     Assessment Assessed  1. Hydrocele Of Male Genital Organs 603.9  Plan Health Maintenance (V70.0)  1. UA With REFLEX  Done: 28Sep2012 10:01AM  Discussion/Summary     Discussed again the nature, risks and benefits of hydrocele excision via scrotal incision including the risks, benefits and alternatives (non-treatment, aspiration). Discussed risks of bleeding, hematoma, swelling, pain, recurrence among others. All questions answered. Pt elects to proceed with formal repair/excision with scrotal incision.   Signatures Electronically signed by : Jerilee Field, M.D.; Dec 18 2010 10:55AM  I have reviewed the history and examined the patient.  There are no changes to the history and physical. Discussed again with patient I will perform Right hydrocelectomy.  Date: Feb 09, 2011 Time: 7:19 AM Doyne Keel, MD

## 2011-02-09 ENCOUNTER — Ambulatory Visit (HOSPITAL_COMMUNITY): Payer: Managed Care, Other (non HMO) | Admitting: Anesthesiology

## 2011-02-09 ENCOUNTER — Encounter (HOSPITAL_COMMUNITY)
Admission: RE | Disposition: A | Discharge status: Home / Self Care | Payer: Self-pay | Source: Ambulatory Visit | Attending: Urology

## 2011-02-09 ENCOUNTER — Other Ambulatory Visit: Payer: Self-pay | Admitting: Urology

## 2011-02-09 ENCOUNTER — Encounter (HOSPITAL_COMMUNITY): Payer: Self-pay | Admitting: *Deleted

## 2011-02-09 ENCOUNTER — Ambulatory Visit (HOSPITAL_COMMUNITY)
Admission: RE | Admit: 2011-02-09 | Discharge: 2011-02-09 | Disposition: A | Discharge status: Home / Self Care | Payer: Managed Care, Other (non HMO) | Source: Ambulatory Visit | Attending: Urology | Admitting: Urology

## 2011-02-09 ENCOUNTER — Encounter (HOSPITAL_COMMUNITY): Payer: Self-pay | Admitting: Anesthesiology

## 2011-02-09 DIAGNOSIS — N433 Hydrocele, unspecified: Secondary | ICD-10-CM | POA: Insufficient documentation

## 2011-02-09 DIAGNOSIS — K219 Gastro-esophageal reflux disease without esophagitis: Secondary | ICD-10-CM | POA: Insufficient documentation

## 2011-02-09 DIAGNOSIS — E119 Type 2 diabetes mellitus without complications: Secondary | ICD-10-CM | POA: Insufficient documentation

## 2011-02-09 DIAGNOSIS — I509 Heart failure, unspecified: Secondary | ICD-10-CM | POA: Insufficient documentation

## 2011-02-09 DIAGNOSIS — G4730 Sleep apnea, unspecified: Secondary | ICD-10-CM | POA: Insufficient documentation

## 2011-02-09 DIAGNOSIS — Z794 Long term (current) use of insulin: Secondary | ICD-10-CM | POA: Insufficient documentation

## 2011-02-09 DIAGNOSIS — Z79899 Other long term (current) drug therapy: Secondary | ICD-10-CM | POA: Insufficient documentation

## 2011-02-09 DIAGNOSIS — Z87898 Personal history of other specified conditions: Secondary | ICD-10-CM | POA: Insufficient documentation

## 2011-02-09 HISTORY — PX: HYDROCELE EXCISION: SHX482

## 2011-02-09 SURGERY — HYDROCELECTOMY
Anesthesia: General | Site: Scrotum | Laterality: Right | Wound class: Clean

## 2011-02-09 MED ORDER — SODIUM CHLORIDE 0.9 % IR SOLN
Status: DC | PRN
  Administered 2011-02-09: 1000 mL

## 2011-02-09 MED ORDER — LACTATED RINGERS IV SOLN
INTRAVENOUS | Status: DC | PRN
  Administered 2011-02-09 (×2): via INTRAVENOUS

## 2011-02-09 MED ORDER — BUPIVACAINE-EPINEPHRINE PF 0.25-1:200000 % IJ SOLN
INTRAMUSCULAR | Status: AC
  Filled 2011-02-09: qty 30

## 2011-02-09 MED ORDER — PROPOFOL 10 MG/ML IV EMUL
INTRAVENOUS | Status: DC | PRN
  Administered 2011-02-09: 350 mg via INTRAVENOUS

## 2011-02-09 MED ORDER — SUCCINYLCHOLINE CHLORIDE 20 MG/ML IJ SOLN
INTRAMUSCULAR | Status: DC | PRN
  Administered 2011-02-09: 200 mg via INTRAVENOUS

## 2011-02-09 MED ORDER — SUFENTANIL CITRATE 50 MCG/ML IV SOLN
INTRAVENOUS | Status: DC | PRN
  Administered 2011-02-09: 10 ug via INTRAVENOUS

## 2011-02-09 MED ORDER — CEPHALEXIN 500 MG PO CAPS: 500.0000 mg | ORAL_CAPSULE | Freq: Three times a day (TID) | ORAL | Status: AC

## 2011-02-09 MED ORDER — PROMETHAZINE HCL 25 MG/ML IJ SOLN: 6.2500 mg | INTRAMUSCULAR | Status: DC | PRN

## 2011-02-09 MED ORDER — ACETAMINOPHEN 10 MG/ML IV SOLN
INTRAVENOUS | Status: DC | PRN
  Administered 2011-02-09: 1000 mg via INTRAVENOUS

## 2011-02-09 MED ORDER — FENTANYL CITRATE 0.05 MG/ML IJ SOLN
INTRAMUSCULAR | Status: AC
  Filled 2011-02-09: qty 2

## 2011-02-09 MED ORDER — BUPIVACAINE-EPINEPHRINE PF 0.25-1:200000 % IJ SOLN
INTRAMUSCULAR | Status: DC | PRN
  Administered 2011-02-09: 5 mL

## 2011-02-09 MED ORDER — HETASTARCH-ELECTROLYTES 6 % IV SOLN
INTRAVENOUS | Status: DC | PRN
  Administered 2011-02-09: 08:00:00 via INTRAVENOUS

## 2011-02-09 MED ORDER — LACTATED RINGERS IV SOLN: INTRAVENOUS | Status: DC

## 2011-02-09 MED ORDER — ACETAMINOPHEN 10 MG/ML IV SOLN
INTRAVENOUS | Status: AC
  Filled 2011-02-09: qty 100

## 2011-02-09 MED ORDER — CEPHALEXIN 500 MG PO CAPS: 500.0000 mg | ORAL_CAPSULE | Freq: Four times a day (QID) | ORAL | Status: DC

## 2011-02-09 MED ORDER — OXYCODONE-ACETAMINOPHEN 5-325 MG PO TABS: 1.0000 | ORAL_TABLET | ORAL | Status: AC | PRN

## 2011-02-09 MED ORDER — LIDOCAINE HCL (CARDIAC) 20 MG/ML IV SOLN
INTRAVENOUS | Status: DC | PRN
  Administered 2011-02-09: 75 mg via INTRAVENOUS

## 2011-02-09 MED ORDER — FENTANYL CITRATE 0.05 MG/ML IJ SOLN
25.0000 ug | INTRAMUSCULAR | Status: DC | PRN
  Administered 2011-02-09: 50 ug via INTRAVENOUS

## 2011-02-09 MED ORDER — ASPIRIN EC 81 MG PO TBEC: 81.0000 mg | DELAYED_RELEASE_TABLET | ORAL | Status: DC

## 2011-02-09 MED ORDER — CEFAZOLIN SODIUM-DEXTROSE 2-3 GM-% IV SOLR
INTRAVENOUS | Status: AC
  Filled 2011-02-09: qty 50

## 2011-02-09 MED ORDER — BUPIVACAINE HCL (PF) 0.25 % IJ SOLN
INTRAMUSCULAR | Status: AC
  Filled 2011-02-09: qty 30

## 2011-02-09 MED ORDER — BACITRACIN-NEOMYCIN-POLYMYXIN 400-5-5000 EX OINT
TOPICAL_OINTMENT | CUTANEOUS | Status: AC
  Filled 2011-02-09: qty 1

## 2011-02-09 MED ORDER — MIDAZOLAM HCL 5 MG/5ML IJ SOLN
INTRAMUSCULAR | Status: DC | PRN
  Administered 2011-02-09: 2 mg via INTRAVENOUS

## 2011-02-09 MED ORDER — OXYCODONE-ACETAMINOPHEN 5-325 MG PO TABS
ORAL_TABLET | ORAL | Status: AC
  Administered 2011-02-09: 1 via ORAL
  Filled 2011-02-09: qty 1

## 2011-02-09 MED ORDER — EPHEDRINE SULFATE 50 MG/ML IJ SOLN
INTRAMUSCULAR | Status: DC | PRN
  Administered 2011-02-09: 10 mg via INTRAVENOUS

## 2011-02-09 MED ORDER — MEPERIDINE HCL 25 MG/ML IJ SOLN: 6.2500 mg | INTRAMUSCULAR | Status: DC | PRN

## 2011-02-09 MED ORDER — PHENYLEPHRINE HCL 10 MG/ML IJ SOLN
INTRAMUSCULAR | Status: DC | PRN
  Administered 2011-02-09: 120 ug via INTRAVENOUS
  Administered 2011-02-09: 160 ug via INTRAVENOUS
  Administered 2011-02-09: 80 ug via INTRAVENOUS
  Administered 2011-02-09: 160 ug via INTRAVENOUS
  Administered 2011-02-09: 120 ug via INTRAVENOUS

## 2011-02-09 MED ORDER — CEFAZOLIN SODIUM-DEXTROSE 2-3 GM-% IV SOLR
2.0000 g | Freq: Once | INTRAVENOUS | Status: AC
  Administered 2011-02-09: 2 g via INTRAVENOUS

## 2011-02-09 SURGICAL SUPPLY — 26 items
BANDAGE GAUZE ELAST BULKY 4 IN (GAUZE/BANDAGES/DRESSINGS) ×2 IMPLANT
BNDG COHESIVE 4X5 TAN NS LF (GAUZE/BANDAGES/DRESSINGS) ×2 IMPLANT
CLOTH BEACON ORANGE TIMEOUT ST (SAFETY) ×2 IMPLANT
COVER SURGICAL LIGHT HANDLE (MISCELLANEOUS) ×2 IMPLANT
DRAPE LAPAROTOMY T 102X78X121 (DRAPES) ×2 IMPLANT
ELECT REM PT RETURN 9FT ADLT (ELECTROSURGICAL) ×2
ELECTRODE REM PT RTRN 9FT ADLT (ELECTROSURGICAL) ×1 IMPLANT
GAUZE SPONGE 4X4 16PLY XRAY LF (GAUZE/BANDAGES/DRESSINGS) IMPLANT
GLOVE BIOGEL M STRL SZ7.5 (GLOVE) ×6 IMPLANT
GOWN STRL REIN XL XLG (GOWN DISPOSABLE) ×2 IMPLANT
KIT BASIN OR (CUSTOM PROCEDURE TRAY) ×2 IMPLANT
NEEDLE HYPO 22GX1.5 SAFETY (NEEDLE) IMPLANT
NS IRRIG 1000ML POUR BTL (IV SOLUTION) ×2 IMPLANT
PACK GENERAL/GYN (CUSTOM PROCEDURE TRAY) ×2 IMPLANT
SPONGE LAP 4X18 X RAY DECT (DISPOSABLE) IMPLANT
SUPPORT SCROTAL LG STRP (MISCELLANEOUS) IMPLANT
SUPPORT SCROTAL MED ADLT STRP (MISCELLANEOUS) IMPLANT
SUT CHROMIC 3 0 SH 27 (SUTURE) IMPLANT
SUT CHROMIC 4 0 RB 1X27 (SUTURE) IMPLANT
SUT MNCRL AB 3-0 PS2 18 (SUTURE) ×2 IMPLANT
SUT VIC AB 2-0 SH 27 (SUTURE) ×2
SUT VIC AB 2-0 SH 27X BRD (SUTURE) ×2 IMPLANT
SYR CONTROL 10ML LL (SYRINGE) IMPLANT
TOWEL OR 17X26 10 PK STRL BLUE (TOWEL DISPOSABLE) ×2 IMPLANT
TOWEL OR NON WOVEN STRL DISP B (DISPOSABLE) ×2 IMPLANT
WATER STERILE IRR 1500ML POUR (IV SOLUTION) IMPLANT

## 2011-02-09 NOTE — Op Note (Signed)
[name], [name] NO.:  [id number]  MEDICAL RECORD NO.:  [id number]  LOCATION:  [facility]                         FACILITY:  [facility]  PHYSICIAN:  Jerilee Field, MD   DATE OF BIRTH:  1961/02/08  DATE OF PROCEDURE:  02/09/2011 DATE OF DISCHARGE:                              OPERATIVE REPORT   PREOPERATIVE DIAGNOSIS:  Right hydrocele.  POSTOPERATIVE DIAGNOSIS:  Right hydrocele.  PROCEDURE:  Right hydrocelectomy, adult.  SURGEONS:  Jerilee Field, MD  TYPE OF ANESTHESIA:  General.  INDICATION FOR PROCEDURE:  [name] is a 50 year old male who has had a right hydrocele for quite some time, it has been enlarging in size, now the size of a large eggplant.  It is very bothersome to the patient and he wanted this removed.  We discussed the nature, risks, benefits, and alternatives to hydrocelectomy including hydrocele recurrence, testicular atrophy, hematoma, bleeding, and chronic pain among others. All questions were answered and the patient elected to proceed with right hydrocelectomy via a scrotal incision.  We discussed he did have some fluid around the left testicle, but this was a small amount and some fluid around the testicle was normal.  We discussed, we would only be addressing the right side.  FINDINGS:  The patient had a large right hydrocele that contained 600 mL of fluid plus quite a bit spilled out and was not counted in the suction.  Once the tunica vaginalis was opened, the testicle itself was normal without mass and normal in appearance.  There was excellent hemostasis, and no drain was left.  DESCRIPTION OF PROCEDURE:  After consent was obtained, the patient was brought to the operating room.  A time-out was performed to confirm the patient and procedure after adequate anesthesia, preop antibiotics, and SCDs.  The scrotum was shaved with clippers.  External genitalia were prepped and draped in the usual fashion.  A right transverse  incision was made on the anterior scrotum, after infiltration with Marcaine with epinephrine.  The dartos fascia was dissected down to the hydrocele sac. Hydrocele sac was then dissected superiorly and inferiorly free from the dartos fascia, and the testicle inside were delivered through the incision.  The remaining aspects of the dartos and cremasteric were separated from the hydrocele sac.  The tunica vaginalis was then opened and drained with suction, which had 600 mL plus of straw-colored fluid. The testicle itself was palpably normal and normal in appearance. Excess hydrocele sac was then transected and sent to pathology.  The testicle was irrigated, sac was then everted and sewn to itself with a running 2-0 Vicryl suture to the posterior aspect of the cord, this was a loose configuration without putting any tension on the cord.  Adequate hemostasis was ensured of all the layers.  The testicle was irrigated again and placed back into the right hemiscrotum without rotation.  The dartos fascia was run closed with a 2-0 Vicryl suture and the skin was closed with a running 3-0 Monocryl.  Bacitracin, fluffs, and a jockstrap were then placed.  He was then awakened, taken to recovery room in stable condition.  ESTIMATED BLOOD LOSS:  Less than 5 mL.  DRAINS:  None.  COMPLICATIONS:  None.  COUNTS:  Were correct.  DISPOSITION:  The patient stable to PACU.          ______________________________ Jerilee Field, MD     ME/MEDQ  D:  02/09/2011  T:  02/09/2011  Job:  [id number]

## 2011-02-09 NOTE — Brief Op Note (Signed)
02/09/2011  8:34 AM  PATIENT:  Daisy Blossom  50 y.o. male  PRE-OPERATIVE DIAGNOSIS:  right hydrocele  POST-OPERATIVE DIAGNOSIS:  right hydrocele  PROCEDURE:  Procedure(s): HYDROCELECTOMY ADULT  SURGEON:  Surgeon(s): Antony Haste, MD  PHYSICIAN ASSISTANT:   ASSISTANTS: none   ANESTHESIA:   general  EBL:   < 5 ml  Findings: 600 mL+ right hydrocele. Normal testicle. Excellent hemostasis.  BLOOD ADMINISTERED:none  DRAINS: none   LOCAL MEDICATIONS USED:  MARCAINE 8CC  SPECIMEN:  Biopsy / Limited Resection - excess right hydrocele sac  DISPOSITION OF SPECIMEN:  PATHOLOGY  COUNTS:  YES  TOURNIQUET:  * No tourniquets in log *  DICTATION: .Other Dictation: Dictation Number [id number]  PLAN OF CARE: Discharge to home after PACU  PATIENT DISPOSITION:  PACU - hemodynamically stable.   Delay start of Pharmacological VTE agent (>24hrs) due to surgical blood loss or risk of bleeding:  {YES/NO/NOT APPLICABLE:20182

## 2011-02-09 NOTE — Anesthesia Preprocedure Evaluation (Addendum)
Anesthesia Evaluation  Patient identified by MRN, date of birth, ID band Patient awake    Reviewed: Allergy & Precautions, H&P , NPO status , Patient's Chart, lab work & pertinent test results  Airway Mallampati: III TM Distance: >3 FB Neck ROM: Full    Dental No notable dental hx. (+) Teeth Intact   Pulmonary neg pulmonary ROS, shortness of breath and with exertion, sleep apnea (no treatment) ,  clear to auscultation  Pulmonary exam normal       Cardiovascular hypertension, Pt. on medications +CHF and neg cardio ROS Regular Normal    Neuro/Psych Negative Neurological ROS  Negative Psych ROS   GI/Hepatic negative GI ROS, Neg liver ROS, GERD-  Poorly Controlled,  Endo/Other  Negative Endocrine ROSDiabetes mellitus-, Type 2, Insulin Dependent and Oral Hypoglycemic AgentsMorbid obesity  Renal/GU negative Renal ROS  Genitourinary negative   Musculoskeletal negative musculoskeletal ROS (+)   Abdominal   Peds negative pediatric ROS (+)  Hematology negative hematology ROS (+)   Anesthesia Other Findings   Reproductive/Obstetrics negative OB ROS                         Anesthesia Physical Anesthesia Plan  ASA: III  Anesthesia Plan: General   Post-op Pain Management:    Induction: Intravenous  Airway Management Planned: Oral ETT  Additional Equipment:   Intra-op Plan:   Post-operative Plan: Extubation in OR  Informed Consent: I have reviewed the patients History and Physical, chart, labs and discussed the procedure including the risks, benefits and alternatives for the proposed anesthesia with the patient or authorized representative who has indicated his/her understanding and acceptance.   Dental advisory given  Plan Discussed with: CRNA  Anesthesia Plan Comments:        Anesthesia Quick Evaluation

## 2011-02-09 NOTE — Transfer of Care (Signed)
Immediate Anesthesia Transfer of Care Note  Patient: [name] Sosa  Procedure(s) Performed:  HYDROCELECTOMY ADULT  Patient Location: PACU  Anesthesia Type: General  Level of Consciousness: awake, alert , oriented and patient cooperative  Airway & Oxygen Therapy: Patient Spontanous Breathing and Patient connected to face mask oxygen  Post-op Assessment: Report given to PACU RN and Post -op Vital signs reviewed and stable  Post vital signs: stable  Complications: No apparent anesthesia complications

## 2011-02-09 NOTE — Progress Notes (Signed)
Pt has sinus drainage and a cough this AM

## 2011-02-09 NOTE — Addendum Note (Signed)
Addendum  created 02/09/11 1229 by Illene Silver, CRNA   Modules edited:Anesthesia Events, Anesthesia Flowsheet, Anesthesia LDA

## 2011-02-09 NOTE — Progress Notes (Signed)
Ice bag and gauze given to pt for home use

## 2011-02-09 NOTE — Anesthesia Postprocedure Evaluation (Signed)
  Anesthesia Post-op Note  Patient: [name] Sosa  Procedure(s) Performed:  HYDROCELECTOMY ADULT  Patient Location: PACU  Anesthesia Type: General  Level of Consciousness: awake and alert   Airway and Oxygen Therapy: Patient Spontanous Breathing  Post-op Pain: mild  Post-op Assessment: Post-op Vital signs reviewed, Patient's Cardiovascular Status Stable, Respiratory Function Stable, Patent Airway and No signs of Nausea or vomiting  Post-op Vital Signs: stable  Complications: No apparent anesthesia complications

## 2011-02-09 NOTE — Addendum Note (Signed)
Addendum  created 02/09/11 1228 by Illene Silver, CRNA   Modules edited:Anesthesia Events, Anesthesia Flowsheet, Anesthesia LDA

## 2011-02-10 ENCOUNTER — Encounter (HOSPITAL_COMMUNITY): Payer: Self-pay | Admitting: Urology

## 2012-04-27 DIAGNOSIS — E119 Type 2 diabetes mellitus without complications: Secondary | ICD-10-CM

## 2012-04-27 DIAGNOSIS — C833A Diffuse large b-cell lymphoma, in remission: Secondary | ICD-10-CM

## 2012-04-27 DIAGNOSIS — C8330 Diffuse large B-cell lymphoma, unspecified site: Secondary | ICD-10-CM

## 2012-04-27 HISTORY — DX: Type 2 diabetes mellitus without complications: E11.9

## 2012-04-27 HISTORY — DX: Diffuse large B-cell lymphoma, unspecified site: C83.30

## 2012-04-27 HISTORY — DX: Diffuse large b-cell lymphoma, in remission: C83.3A

## 2012-07-17 ENCOUNTER — Ambulatory Visit: Payer: Managed Care, Other (non HMO) | Attending: Orthopaedic Surgery

## 2012-07-17 DIAGNOSIS — IMO0001 Reserved for inherently not codable concepts without codable children: Secondary | ICD-10-CM | POA: Insufficient documentation

## 2012-07-17 DIAGNOSIS — M25569 Pain in unspecified knee: Secondary | ICD-10-CM | POA: Insufficient documentation

## 2012-07-17 DIAGNOSIS — M545 Low back pain, unspecified: Secondary | ICD-10-CM | POA: Insufficient documentation

## 2012-07-17 DIAGNOSIS — R5381 Other malaise: Secondary | ICD-10-CM | POA: Insufficient documentation

## 2012-07-20 ENCOUNTER — Ambulatory Visit: Payer: Managed Care, Other (non HMO) | Attending: Orthopaedic Surgery | Admitting: Physical Therapy

## 2012-07-20 DIAGNOSIS — M545 Low back pain, unspecified: Secondary | ICD-10-CM | POA: Insufficient documentation

## 2012-07-20 DIAGNOSIS — IMO0001 Reserved for inherently not codable concepts without codable children: Secondary | ICD-10-CM | POA: Insufficient documentation

## 2012-07-20 DIAGNOSIS — M25569 Pain in unspecified knee: Secondary | ICD-10-CM | POA: Insufficient documentation

## 2012-07-20 DIAGNOSIS — R5381 Other malaise: Secondary | ICD-10-CM | POA: Insufficient documentation

## 2012-07-24 ENCOUNTER — Ambulatory Visit: Payer: Managed Care, Other (non HMO)

## 2012-07-26 ENCOUNTER — Encounter: Payer: Managed Care, Other (non HMO) | Admitting: Physical Therapy

## 2012-07-31 ENCOUNTER — Ambulatory Visit: Payer: Managed Care, Other (non HMO)

## 2012-08-02 ENCOUNTER — Ambulatory Visit: Payer: Managed Care, Other (non HMO) | Admitting: Physical Therapy

## 2012-08-03 ENCOUNTER — Ambulatory Visit: Payer: Managed Care, Other (non HMO)

## 2012-08-07 ENCOUNTER — Encounter: Payer: Managed Care, Other (non HMO) | Admitting: Physical Therapy

## 2012-08-09 ENCOUNTER — Ambulatory Visit: Payer: Managed Care, Other (non HMO) | Admitting: Physical Therapy

## 2012-08-15 ENCOUNTER — Ambulatory Visit: Payer: Managed Care, Other (non HMO) | Admitting: Physical Therapy

## 2012-08-17 ENCOUNTER — Ambulatory Visit: Payer: Managed Care, Other (non HMO) | Admitting: Physical Therapy

## 2012-08-21 ENCOUNTER — Ambulatory Visit: Payer: Managed Care, Other (non HMO) | Admitting: Physical Therapy

## 2012-08-23 ENCOUNTER — Ambulatory Visit: Payer: Managed Care, Other (non HMO) | Attending: Orthopaedic Surgery

## 2012-08-23 DIAGNOSIS — IMO0001 Reserved for inherently not codable concepts without codable children: Secondary | ICD-10-CM | POA: Insufficient documentation

## 2012-08-23 DIAGNOSIS — M545 Low back pain, unspecified: Secondary | ICD-10-CM | POA: Insufficient documentation

## 2012-08-23 DIAGNOSIS — R5381 Other malaise: Secondary | ICD-10-CM | POA: Insufficient documentation

## 2012-08-23 DIAGNOSIS — M25569 Pain in unspecified knee: Secondary | ICD-10-CM | POA: Insufficient documentation

## 2012-08-24 ENCOUNTER — Ambulatory Visit: Payer: Managed Care, Other (non HMO)

## 2012-09-13 ENCOUNTER — Other Ambulatory Visit (HOSPITAL_COMMUNITY): Payer: Self-pay | Admitting: Urology

## 2012-09-13 DIAGNOSIS — R3129 Other microscopic hematuria: Secondary | ICD-10-CM

## 2012-09-13 DIAGNOSIS — N1330 Unspecified hydronephrosis: Secondary | ICD-10-CM

## 2012-09-18 ENCOUNTER — Ambulatory Visit (HOSPITAL_COMMUNITY): Payer: Managed Care, Other (non HMO)

## 2012-10-03 ENCOUNTER — Ambulatory Visit (HOSPITAL_COMMUNITY): Payer: Self-pay

## 2012-10-12 ENCOUNTER — Ambulatory Visit (HOSPITAL_COMMUNITY)
Admission: RE | Admit: 2012-10-12 | Discharge: 2012-10-12 | Disposition: A | Discharge status: Home / Self Care | Payer: 59 | Source: Ambulatory Visit | Attending: Urology | Admitting: Urology

## 2012-10-12 DIAGNOSIS — N269 Renal sclerosis, unspecified: Secondary | ICD-10-CM | POA: Insufficient documentation

## 2012-10-12 DIAGNOSIS — E279 Disorder of adrenal gland, unspecified: Secondary | ICD-10-CM | POA: Insufficient documentation

## 2012-10-12 DIAGNOSIS — N1330 Unspecified hydronephrosis: Secondary | ICD-10-CM

## 2012-10-12 DIAGNOSIS — R3129 Other microscopic hematuria: Secondary | ICD-10-CM | POA: Insufficient documentation

## 2012-11-23 ENCOUNTER — Ambulatory Visit (INDEPENDENT_AMBULATORY_CARE_PROVIDER_SITE_OTHER): Payer: 59 | Admitting: General Surgery

## 2012-11-23 ENCOUNTER — Encounter (INDEPENDENT_AMBULATORY_CARE_PROVIDER_SITE_OTHER): Payer: Self-pay | Admitting: General Surgery

## 2012-11-23 ENCOUNTER — Telehealth (INDEPENDENT_AMBULATORY_CARE_PROVIDER_SITE_OTHER): Payer: Self-pay | Admitting: General Surgery

## 2012-11-23 VITALS — BP 130/96 | HR 88 | Temp 97.6°F | Resp 15 | Ht 73.0 in | Wt >= 6400 oz

## 2012-11-23 DIAGNOSIS — K2100 Gastro-esophageal reflux disease with esophagitis, without bleeding: Secondary | ICD-10-CM

## 2012-11-23 DIAGNOSIS — G4733 Obstructive sleep apnea (adult) (pediatric): Secondary | ICD-10-CM

## 2012-11-23 DIAGNOSIS — Z6841 Body Mass Index (BMI) 40.0 and over, adult: Secondary | ICD-10-CM

## 2012-11-23 DIAGNOSIS — I10 Essential (primary) hypertension: Secondary | ICD-10-CM

## 2012-11-23 NOTE — Telephone Encounter (Signed)
Called [facility] and spoke to someone in med recd who stated that I needed to fax over signed med release which I did and confirm received..spoke with Pam/Dr.John Valentina Lucks [phone] who stated that she would fax over all med recds that they had on the patient, and LMOM 9/4@2 :20 for Nicole/Dr.Henry Smith-Cardiology [phone] requesting med recds to be faxed over to my attn to [phone]

## 2012-11-23 NOTE — Patient Instructions (Signed)
We will gather your medical records and review them and bring you back to discuss surgery and pathway to surgery

## 2012-11-23 NOTE — Progress Notes (Signed)
Patient ID: [name] Sosa, male   DOB: August 31, 1960, 52 y.o.   MRN: [id number]  Chief Complaint  Patient presents with  . Weight Loss Surgery   (Note - outside records not available during visit; obtained and reviewed after visit and added to today's visit note) HPI [name] Sosa is a 52 y.o. male.   HPI 52 year old morbidly obese African American male referred by Dr. Valentina Lucks for evaluation for weight loss surgery. The patient states that he is specifically interested in laparoscopic Roux-en-Y gastric bypass surgery. He is not attracted to the sleeve gastrectomy because it is not reversible. He states that he has struggled with his weight his entire life. Most recently he has gained about 40 pounds over the past 6 months despite not eating much food and not much of a change in his appetite. The patient has tried numerous attempts for sustained weight loss but Has not had long-term success. He has tried Vickey Sages, [employer], Slim fast, and Redux without any long-term success  Of note the patient has a history of large B-cell lymphoma in 2007. He had a cecal mass that was detected on colonoscopy that was biopsied to be shown lymphoma. He had a mental, mesenteric, and retroperitoneal involvement. He underwent chemotherapy but had to have a change in his regimen due to a depressed ejection fraction. He also has a known left adrenal gland mass which has essentially been the same size since 2007. He saw an oncologist most recently at [facility] in the winter of this year and there is no clinical evidence of recurrent disease. He most recently has been evaluated for microscopic hematuria. He was seen and evaluated by Dr. Mena Goes at [facility] urology. His cystoscopy was negative. He does have chronic kidney disease with a baseline creatinine of around 2.2. He does have insulin-dependent diabetes mellitus. He has been diabetic for at least 15 years. He also carries a diagnosis of nonischemic cardiomyopathy  and is followed by Dr. Katrinka Blazing at [place].  Past Medical History  Diagnosis Date  . Cardiomyopathy, nonischemic     DX 2003 CARDIAC CATH, ECHO 2006 SHOWED EF IMPROVED TO 45-50%--PER CARDIOLOGY OFFICE NOTES DR. Mendel Ryder FROM 01/20/10  . Hypertension   . Diabetes mellitus   . Hyperlipidemia   . GERD (gastroesophageal reflux disease)   . Apnea, sleep     UNABLE TO TOLERATE CPAP--PT STATES HE WAS TOLD MILD SLEEP APNEA  . Multiple thyroid nodules     HX OF NODULES=PT STATES BIOPSY-TOLD ALL OKAY  . Hydrocele     HX OF RT HYDROCELE FOR YEARS-NOW ENLARGED-PLANS SURGERY  . Non-Hodgkin lymphoma     DX ABOUT 2007-TX'D WITH CHEMO-IN REMISSION  . Chronic kidney disease     HX OF CHRONIC KIDNEY DISEASE STAGE III - EVALUATED AT [facility] IN THE PAST AND FELT TO BE RELATED TO NSAID'S AND CHEMO.  PT  STATES HE HAS NOT SEEN KIDNEY SPECIALIST AT [facility] IN OVER A YEAR.  Marland Kitchen CHF (congestive heart failure) 2003  . Shortness of breath     WITH EXERTION  . Cancer     HX OF NON HODGKIN'S LYMPHOMA -IN REMISSION  . Anxiety     HX OF ANXIETY ATTACKS--CAN'T SLEEP ON BACK-FEELS LIKE HE CAN'T BREATHE    Past Surgical History  Procedure Laterality Date  . Hydrocele excision  02/09/2011    Procedure: HYDROCELECTOMY ADULT;  Surgeon: Antony Haste, MD;  Location: [facility];  Service: Urology;  Laterality: Right;    Family History  Problem Relation Age of Onset  . Adopted: Yes    Social History History  Substance Use Topics  . Smoking status: Never Smoker   . Smokeless tobacco: Never Used  . Alcohol Use: Yes     Comment: RARELY    No Known Allergies  Current Outpatient Prescriptions  Medication Sig Dispense Refill  . acetaminophen (TYLENOL) 500 MG tablet Take 1,000 mg by mouth every 6 (six) hours as needed. pain      . ALPRAZolam (XANAX) 0.5 MG tablet Take 0.5 mg by mouth 2 (two) times daily as needed. RARELY TAKES UNLESS DENTAL APPOINTMENT OR FLYING OR DOCTOR'S OFFICE OR SURGERY       . aspirin EC  81 MG tablet Take 1 tablet (81 mg total) by mouth every morning.      . carvedilol (COREG) 25 MG tablet Take 25 mg by mouth 2 (two) times daily with a meal.       . ferrous sulfate 325 (65 FE) MG tablet Take 325 mg by mouth every evening.       . insulin glargine (LANTUS) 100 UNIT/ML injection Inject 120 Units into the skin at bedtime.       . insulin lispro (HUMALOG) 100 UNIT/ML injection Inject 60 Units into the skin 3 (three) times daily before meals.       Marland Kitchen omega-3 acid ethyl esters (LOVAZA) 1 G capsule Take 1 g by mouth 2 (two) times daily.       . ONE TOUCH ULTRA TEST test strip       . ramipril (ALTACE) 10 MG capsule Take 10 mg by mouth 2 (two) times daily.       . Testosterone (FORTESTA) 10 MG/ACT (2%) GEL Place 1 application onto the skin every morning.       . cetirizine (ZYRTEC) 10 MG tablet Take 10 mg by mouth every evening.       . Cholecalciferol (VITAMIN D3) 1000 UNITS CAPS Take 1 capsule by mouth daily.       . fluticasone (FLONASE) 50 MCG/ACT nasal spray Place 2 sprays into the nose daily.       Marland Kitchen glimepiride (AMARYL) 4 MG tablet Take 4 mg by mouth every evening.       . RABEprazole (ACIPHEX) 20 MG tablet Take 20 mg by mouth every morning.        No current facility-administered medications for this visit.    Review of Systems Review of Systems  Constitutional: Negative for fever, chills, appetite change and unexpected weight change.  HENT: Positive for postnasal drip. Negative for congestion and trouble swallowing.        Reports some sinus drainage  Eyes: Negative for visual disturbance.  Respiratory: Negative for chest tightness and shortness of breath.        States dx with OSA in 2007, tried CPAP but too claustrophobic; scored 16 on Epworth sleepiness scale today. States PCP has recommended in home sleep study.   Cardiovascular: Negative for chest pain and leg swelling.       No PND, no orthopnea, gets DOE with incline/walking up hill. H/o NICM. Cardiologist Dr Mendel Ryder. Some b/l ankle swelling.   Gastrointestinal: Negative for nausea, vomiting, abdominal pain, diarrhea, constipation and blood in stool.       See HPI; some reflux at night.   Endocrine:       States DM for about 15 yrs. 60-90u of humalog; 110u of Lantus. States complaint with meds  Genitourinary: Negative for dysuria,  hematuria, decreased urine volume and difficulty urinating.       Has been evaluated for microscopic hematuria at [facility] - see below; CKD cr around 2.2; reports normal urination.   Musculoskeletal: Positive for back pain.       L hip pain. Low back pain.  Skin: Negative for rash.  Neurological: Negative for seizures and speech difficulty.  Hematological: Does not bruise/bleed easily.       Microcytic anemia. H/o B cell lymphoma - see above. Saw hem/onc at [facility] in the winter  Psychiatric/Behavioral: Negative for behavioral problems and confusion.    Blood pressure 130/96, pulse 88, temperature 97.6 F (36.4 C), temperature source Temporal, resp. rate 15, height 6\' 1"  (1.854 m), weight 435 lb 6.4 oz (197.496 kg).  Physical Exam Physical Exam  Vitals reviewed. Constitutional: He is oriented to person, place, and time. He appears well-developed and well-nourished. No distress.  Morbidly obese, evenly distributed  HENT:  Head: Normocephalic and atraumatic.  Right Ear: External ear normal.  Left Ear: External ear normal.  Eyes: Conjunctivae are normal. No scleral icterus.  Neck: Normal range of motion. Neck supple. No tracheal deviation present. No thyromegaly present.  Large thick neck  Cardiovascular: Normal rate, normal heart sounds and intact distal pulses.   Pulmonary/Chest: Effort normal and breath sounds normal. No respiratory distress. He has no wheezes.  Abdominal: Soft. He exhibits no distension. There is no tenderness. There is no rebound and no guarding.  2 areas of hyperpigmentation  Musculoskeletal: Normal range of motion. He exhibits no  edema and no tenderness.  Thick woody skin b/l LE. No signif edema  Lymphadenopathy:    He has no cervical adenopathy.  Neurological: He is alert and oriented to person, place, and time. He exhibits normal muscle tone.  Skin: Skin is warm and dry. No rash noted. He is not diaphoretic. No erythema. No pallor.  Psychiatric: He has a normal mood and affect. His behavior is normal. Judgment and thought content normal.    Data Reviewed Dr Angelica Pou note 04/2012 (hem-onc) at [facility] - h/o diffuse large B cell lymphoma in 2007 (abdominal, cecal mass, omental and mesenteric involvement), microcytic anemia - no evid of disease, consider repeat colonoscopy  Dr Kirby Funk note 08/31/12 with labs - Hgb A1C 8.2; UA - 2+blood, 5-20 RBCs, 1+protein,   Dr Estil Daft note 10/25/12 - eval for microscopic hematuria, negative cystoscopy, f/u 1 year  Dr Verdis Prime note 01/2008, 01/2009, 01/2010, 03/2011, 04/2012  Echo 04/12/2011 - Dr Verdis Prime - poor study due to body habitus- moderate LV dilatation, mild concentric LVH, LV EF 40-45%, probable grade II diastolic dysfunction  2010 echo from [facility]  Labs 04/20/12 - TC 179, TG 193, nonHDL 134, Cr 2.24; hgb 12.1, hct 40.1, mcv 77.9  Old CT and PET scans from 2007 along with 2007 path reports  CT ABDOMEN AND PELVIS WITHOUT CONTRAST 09/2012 Technique: Multidetector CT imaging of the abdomen and pelvis was  performed following the standard protocol without intravenous  contrast.  Comparison: CT of the abdomen and pelvis 09/06/2005.  Findings:  Lung Bases: Unremarkable.  Abdomen/Pelvis: No abnormal calcifications are noted within the  collecting system of either kidney, along the course of either  ureter, or within the lumen of the urinary bladder. No  hydroureteronephrosis or perinephric stranding to suggest  significant urinary tract obstruction at this time. Severe right  renal atrophy. The unenhanced appearance of the kidneys is  otherwise unremarkable  bilaterally.  Again noted is mass-like  enlargement of the left adrenal gland  which measures up to the 3.2 x 3.0 cm and is low attenuation (8  HU), similar to the prior study from 2007, presumably a large  adenoma. Mild thickening of the right adrenal gland. Well defined  1.8 cm low attenuation lesion in segment 2 of the liver is only  slightly larger than on prior study, likely to represent a cyst  (technically incompletely characterized on today's examination).  No other focal hepatic lesions are noted. The appearance of the  gallbladder, pancreas and spleen is unremarkable. Normal appendix.  No significant volume of ascites. No pneumoperitoneum. No  pathologic distension of small bowel. No definite pathologic  lymphadenopathy identified within the abdomen or pelvis. No  definite pathologic lymphadenopathy identified with the abdomen or  pelvis on today's noncontrast CT examination.  Musculoskeletal: There are no aggressive appearing lytic or blastic  lesions noted in the visualized portions of the skeleton.   IMPRESSION:  1. No definite source of microhematuria identified in today's  examination. Additionally, there is no hydronephrosis.  2. Severe right renal atrophy.  3. 3.2 x 3.0 cm low attenuation left adrenal mass is similar to  the prior study from 2007, presumably an adenoma.  4. Slight increase in size of what is now a 1.8 cm well-defined  low attenuation lesion in segment 2 of the liver. Although  incompletely characterized on today's noncontrast CT examination,  this is strongly favored to represent a cyst.  5. Normal appendix.   Assessment    Morbid obesity BMI 57.44 H/o abdominal diffuse large B cell lymphoma IDDM OSA - not on CPAP H/o non-ischemic cardiomyopathy HTN Dislipidemia GERD Chronic Kidney Disease - stage IV L hip pain Low back pain Microcytic anemia  Left adrenal mass    Plan    A total of 80 minutes was spent interviewing the patient,  physical exam, in reviewing his medical records. I was able to obtain all of his medical records for the most part after his appointment and review late this afternoon. They were not available at the time of his appointment.  I explained to the patient while he was in the office that due to his extensive medical history of lymphoma, cardiomyopathy, chronic kidney disease etc. I cannot give him a Adequate risk profile with respect to weight loss surgery without obtaining his outside medical records.  I explained that I would like to obtain his outside medical records from his cardiologist, internist, oncologist and reviewed them and bring him back to discuss surgery and what would be needed to complete his evaluation.  UPDATE 11/29/12 10:20 AM- i have reviewed his records. I believe we can proceed with his evaluation. He will need cbc, cmet, lipids, A1C, h pylori, EKG, CXR, cardiology clearance, repeat colonoscopy (given his history of cecal and abdominal lymphoma - no scope since 2007), nutrition, and psychological consults. Once complete we will bring him back to discuss results and then submit his packet to his insurance provider. He is scheduled for in-home sleep study this Friday. I spoke with the pt via phone just now who agrees with the plan.   Mary Sella. Andrey Campanile, MD, FACS General, Bariatric, & Minimally Invasive Surgery [facility], [place]        [facility] M 11/23/2012, 4:22 PM

## 2012-11-27 ENCOUNTER — Encounter (INDEPENDENT_AMBULATORY_CARE_PROVIDER_SITE_OTHER): Payer: Self-pay

## 2012-11-30 ENCOUNTER — Encounter (INDEPENDENT_AMBULATORY_CARE_PROVIDER_SITE_OTHER): Payer: Self-pay | Admitting: General Surgery

## 2012-11-30 ENCOUNTER — Telehealth (INDEPENDENT_AMBULATORY_CARE_PROVIDER_SITE_OTHER): Payer: Self-pay | Admitting: General Surgery

## 2012-11-30 ENCOUNTER — Other Ambulatory Visit (INDEPENDENT_AMBULATORY_CARE_PROVIDER_SITE_OTHER): Payer: Self-pay | Admitting: General Surgery

## 2012-11-30 NOTE — Telephone Encounter (Signed)
Called patient to let him know about lab work that needs to be done before his appt with [place] on 9/19.Marland KitchenMarland Kitchenpatient agreeable with POC at this time

## 2012-12-01 ENCOUNTER — Other Ambulatory Visit (INDEPENDENT_AMBULATORY_CARE_PROVIDER_SITE_OTHER): Payer: Self-pay | Admitting: General Surgery

## 2012-12-01 DIAGNOSIS — Z8579 Personal history of other malignant neoplasms of lymphoid, hematopoietic and related tissues: Secondary | ICD-10-CM

## 2012-12-04 LAB — COMPREHENSIVE METABOLIC PANEL
ALT: 15 U/L (ref 0–53)
CO2: 30 mEq/L (ref 19–32)
Creat: 1.91 mg/dL — ABNORMAL HIGH (ref 0.50–1.35)
Total Bilirubin: 0.4 mg/dL (ref 0.3–1.2)

## 2012-12-04 LAB — CBC WITH DIFFERENTIAL/PLATELET
Eosinophils Absolute: 0.1 10*3/uL (ref 0.0–0.7)
Eosinophils Relative: 2 % (ref 0–5)
Lymphs Abs: 2.1 10*3/uL (ref 0.7–4.0)
MCH: 23.9 pg — ABNORMAL LOW (ref 26.0–34.0)
MCHC: 32 g/dL (ref 30.0–36.0)
MCV: 74.8 fL — ABNORMAL LOW (ref 78.0–100.0)
Platelets: 317 10*3/uL (ref 150–400)
RBC: 4.89 MIL/uL (ref 4.22–5.81)
RDW: 15.8 % — ABNORMAL HIGH (ref 11.5–15.5)

## 2012-12-04 LAB — LIPID PANEL: HDL: 48 mg/dL (ref >39)

## 2012-12-04 LAB — HEMOGLOBIN A1C: Mean Plasma Glucose: 186 mg/dL — ABNORMAL HIGH (ref <117)

## 2012-12-05 LAB — TSH: TSH: 0.359 u[IU]/mL (ref 0.350–4.500)

## 2012-12-05 LAB — T4: T4, Total: 10.7 ug/dL (ref 5.0–12.5)

## 2012-12-05 LAB — H. PYLORI ANTIBODY, IGG: H Pylori IgG: 0.7 {ISR}

## 2012-12-07 ENCOUNTER — Telehealth (INDEPENDENT_AMBULATORY_CARE_PROVIDER_SITE_OTHER): Payer: Self-pay | Admitting: General Surgery

## 2012-12-07 NOTE — Telephone Encounter (Signed)
Spoke with pt and explained that we have him scheduled to see Dr. Danise Edge with [facility] on 12/13/12 at 2:00.  He explained that he already has an appt that day but that he will call himself and reschedule the appt to what works best for him.  Phone number was given for their office [phone].

## 2012-12-08 ENCOUNTER — Encounter (INDEPENDENT_AMBULATORY_CARE_PROVIDER_SITE_OTHER): Payer: Self-pay | Admitting: General Surgery

## 2012-12-08 ENCOUNTER — Ambulatory Visit (INDEPENDENT_AMBULATORY_CARE_PROVIDER_SITE_OTHER): Payer: 59 | Admitting: General Surgery

## 2012-12-08 VITALS — BP 126/74 | HR 96 | Temp 98.9°F | Resp 16 | Ht 73.0 in | Wt >= 6400 oz

## 2012-12-08 DIAGNOSIS — N183 Chronic kidney disease, stage 3 unspecified: Secondary | ICD-10-CM | POA: Insufficient documentation

## 2012-12-08 DIAGNOSIS — IMO0001 Reserved for inherently not codable concepts without codable children: Secondary | ICD-10-CM | POA: Insufficient documentation

## 2012-12-08 DIAGNOSIS — G4733 Obstructive sleep apnea (adult) (pediatric): Secondary | ICD-10-CM | POA: Insufficient documentation

## 2012-12-08 DIAGNOSIS — Z6841 Body Mass Index (BMI) 40.0 and over, adult: Secondary | ICD-10-CM

## 2012-12-08 HISTORY — DX: Reserved for inherently not codable concepts without codable children: IMO0001

## 2012-12-08 HISTORY — DX: Obstructive sleep apnea (adult) (pediatric): G47.33

## 2012-12-08 HISTORY — DX: Chronic kidney disease, stage 3 unspecified: N18.30

## 2012-12-08 NOTE — Patient Instructions (Signed)
We will continue the process. Please call with any questions. Please work on food choices, eliminating sugar and artificial sweetners from your diet, and getting active

## 2012-12-13 ENCOUNTER — Other Ambulatory Visit: Payer: Self-pay | Admitting: Gastroenterology

## 2012-12-14 ENCOUNTER — Encounter (HOSPITAL_COMMUNITY): Payer: Self-pay | Admitting: *Deleted

## 2012-12-18 NOTE — Progress Notes (Signed)
Patient ID: [name] Sosa, male   DOB: Sep 18, 1960, 52 y.o.   MRN: [id number]  52 yo [name] comes in back to discuss risks and benefits of laparoscopic roux en y gastric bypass (LRYGB). I initially met him on 11/29/12. At that time, we did not have any of the his outside medical records so I was not able to give him a true picture of his potential risk for LRYGB. A total of roughly 80 minutes was used in his first consult performing the exam, obtaining history and finally reviewing his outside records.   He denies any changes since I last saw him other than he has completed an in home sleep study this past Friday. He denies any medication changes.   It appears after reviewing his outside medical records that we will be able to continue his evaluation for LRYGB. I did explain that given his history of abdominal/cecal Large B cell lymphoma that he will need a colonoscopy to make sure there is no evidence of recurrence. He had a 'clean' CT in July 2014 - he had a stable enlarged left adrenal gland.  I explained that he will need to followup with his PCP regarding his sleep apnea and start wearing CPAP.   We than began to discuss LRYGB.   The patient meets weight loss surgery criteria. I think the patient would be an acceptable candidate for Laparoscopic Roux-en-Y Gastric bypass.   We discussed laparoscopic Roux-en-Y gastric bypass. We discussed the preoperative, operative and postoperative process. Using diagrams, I explained the surgery in detail including the performance of an EGD near the end of the surgery and an Upper GI swallow study on POD 1. We discussed the typical hospital course including a 2-3 day stay baring any complications.   The patient was given educational material. I quoted the patient that they can expect to lose 50-70% of their excess weight with the gastric bypass. We did discuss the possibility of weight regain several years after the procedure.  We discussed the risk and benefits  of surgery including but not limited to anesthesia risk, bleeding, infection, anastomotic edema requiring a few additional days in the hospital, postop nausea, possible conversion to open procedure, blood clot formation, anastomotic leak, anastomotic stricture, ulcer formation, death, respiratory complications, intestinal blockage, internal hernia, gallstone formation, vitamin and nutritional deficiencies, hair loss, weight regain injury to surrounding structures, failure to lose weight and mood changes.  We discussed that before and after surgery that there would be an alteration in their diet. I explained that we have put them on a diet 2 weeks before surgery. I also explained that they would be on a liquid diet for 2 weeks after surgery. We discussed that they would have to avoid certain foods such as sugar after surgery. We discussed the importance of physical activity as well as compliance with our dietary and supplement recommendations and routine follow-up.  I explained to the patient that we will start our evaluation process which includes labs, Upper GI to evaluate stomach and swallowing anatomy, nutritionist consultation, psychiatrist consultation, EKG, CXR, abdominal ultrasound, colonoscopy, cardiac consultation.   A total of 30 minutes was spent counseling regarding the proposed procedure including risks, benefits, alternative treatments and typical postop course  Mary Sella. Andrey Campanile, MD, FACS General, Bariatric, & Minimally Invasive Surgery [facility], [place] .

## 2012-12-19 ENCOUNTER — Encounter (HOSPITAL_COMMUNITY): Payer: Self-pay | Admitting: Pharmacy Technician

## 2012-12-21 ENCOUNTER — Ambulatory Visit (HOSPITAL_COMMUNITY)
Admission: RE | Admit: 2012-12-21 | Discharge: 2012-12-21 | Disposition: A | Discharge status: Home / Self Care | Payer: 59 | Source: Ambulatory Visit | Attending: General Surgery | Admitting: General Surgery

## 2012-12-21 DIAGNOSIS — K219 Gastro-esophageal reflux disease without esophagitis: Secondary | ICD-10-CM | POA: Insufficient documentation

## 2012-12-21 DIAGNOSIS — G4730 Sleep apnea, unspecified: Secondary | ICD-10-CM | POA: Insufficient documentation

## 2012-12-21 DIAGNOSIS — I10 Essential (primary) hypertension: Secondary | ICD-10-CM | POA: Insufficient documentation

## 2012-12-21 DIAGNOSIS — E119 Type 2 diabetes mellitus without complications: Secondary | ICD-10-CM | POA: Insufficient documentation

## 2012-12-21 DIAGNOSIS — I509 Heart failure, unspecified: Secondary | ICD-10-CM | POA: Insufficient documentation

## 2012-12-21 DIAGNOSIS — E785 Hyperlipidemia, unspecified: Secondary | ICD-10-CM | POA: Insufficient documentation

## 2012-12-21 DIAGNOSIS — E041 Nontoxic single thyroid nodule: Secondary | ICD-10-CM | POA: Insufficient documentation

## 2012-12-26 ENCOUNTER — Encounter: Payer: 59 | Attending: General Surgery | Admitting: Dietician

## 2012-12-26 ENCOUNTER — Encounter: Payer: Self-pay | Admitting: Dietician

## 2012-12-26 VITALS — Ht 73.0 in | Wt >= 6400 oz

## 2012-12-26 DIAGNOSIS — Z713 Dietary counseling and surveillance: Secondary | ICD-10-CM | POA: Insufficient documentation

## 2012-12-26 NOTE — Progress Notes (Signed)
  Pre-Op Assessment Visit:  Pre-Operative RYGB Surgery  Medical Nutrition Therapy:  Appt start time: 9:45   End time:  10:30.  Patient was seen on 12/26/2012 for Pre-Operative RYGB Nutrition Assessment. Assessment and letter of approval faxed to [facility] Bariatric Surgery Program coordinator on 12/26/2012.   Handouts given during visit include:  Pre-Op Goals Bariatric Surgery Protein Shakes  Patient to call the [facility] to enroll in Pre-Op and Post-Op Nutrition Education when surgery date is scheduled.

## 2012-12-26 NOTE — Patient Instructions (Addendum)
Patient to call the [facility] to enroll in Pre-Op and Post-Op Nutrition Education when surgery date is scheduled. 

## 2012-12-27 ENCOUNTER — Other Ambulatory Visit (HOSPITAL_COMMUNITY): Payer: Self-pay | Admitting: Interventional Cardiology

## 2012-12-27 DIAGNOSIS — I509 Heart failure, unspecified: Secondary | ICD-10-CM

## 2012-12-29 ENCOUNTER — Ambulatory Visit (HOSPITAL_COMMUNITY): Payer: 59 | Attending: Internal Medicine

## 2012-12-29 DIAGNOSIS — I129 Hypertensive chronic kidney disease with stage 1 through stage 4 chronic kidney disease, or unspecified chronic kidney disease: Secondary | ICD-10-CM | POA: Insufficient documentation

## 2012-12-29 DIAGNOSIS — I079 Rheumatic tricuspid valve disease, unspecified: Secondary | ICD-10-CM | POA: Insufficient documentation

## 2012-12-29 DIAGNOSIS — R609 Edema, unspecified: Secondary | ICD-10-CM | POA: Insufficient documentation

## 2012-12-29 DIAGNOSIS — R0989 Other specified symptoms and signs involving the circulatory and respiratory systems: Secondary | ICD-10-CM | POA: Insufficient documentation

## 2012-12-29 DIAGNOSIS — Z01810 Encounter for preprocedural cardiovascular examination: Secondary | ICD-10-CM | POA: Insufficient documentation

## 2012-12-29 DIAGNOSIS — R0609 Other forms of dyspnea: Secondary | ICD-10-CM | POA: Insufficient documentation

## 2012-12-29 DIAGNOSIS — I059 Rheumatic mitral valve disease, unspecified: Secondary | ICD-10-CM | POA: Insufficient documentation

## 2012-12-29 DIAGNOSIS — N189 Chronic kidney disease, unspecified: Secondary | ICD-10-CM | POA: Insufficient documentation

## 2012-12-29 DIAGNOSIS — I509 Heart failure, unspecified: Secondary | ICD-10-CM | POA: Insufficient documentation

## 2012-12-29 DIAGNOSIS — I428 Other cardiomyopathies: Secondary | ICD-10-CM | POA: Insufficient documentation

## 2012-12-29 NOTE — Progress Notes (Signed)
Echocardiogram performed.  

## 2013-01-02 ENCOUNTER — Encounter (INDEPENDENT_AMBULATORY_CARE_PROVIDER_SITE_OTHER): Payer: Self-pay

## 2013-01-02 ENCOUNTER — Telehealth: Payer: Self-pay

## 2013-01-02 NOTE — Telephone Encounter (Signed)
pt notified of echo results.cardiac clearance faxed to Dr.Eric [facility]. pt verbalized understanding

## 2013-01-02 NOTE — Telephone Encounter (Signed)
Message copied by Jarvis Newcomer on Tue Jan 02, 2013  8:17 AM ------      Message from: Verdis Prime      Created: Mon Jan 01, 2013  8:23 AM       LV function is low normal with LVEF 45%. Cleared for surgery and need to notify Dr.Eric Andrey Campanile that he is cleared. ------

## 2013-01-09 ENCOUNTER — Ambulatory Visit (HOSPITAL_COMMUNITY): Payer: 59 | Admitting: *Deleted

## 2013-01-09 ENCOUNTER — Encounter (HOSPITAL_COMMUNITY): Payer: 59 | Admitting: *Deleted

## 2013-01-09 ENCOUNTER — Ambulatory Visit (HOSPITAL_COMMUNITY)
Admission: RE | Admit: 2013-01-09 | Discharge: 2013-01-09 | Disposition: A | Discharge status: Home / Self Care | Payer: 59 | Source: Ambulatory Visit | Attending: Gastroenterology | Admitting: Gastroenterology

## 2013-01-09 ENCOUNTER — Encounter (HOSPITAL_COMMUNITY)
Admission: RE | Disposition: A | Discharge status: Home / Self Care | Payer: Self-pay | Source: Ambulatory Visit | Attending: Gastroenterology

## 2013-01-09 ENCOUNTER — Encounter (HOSPITAL_COMMUNITY): Payer: Self-pay

## 2013-01-09 DIAGNOSIS — D128 Benign neoplasm of rectum: Secondary | ICD-10-CM | POA: Insufficient documentation

## 2013-01-09 DIAGNOSIS — E7800 Pure hypercholesterolemia, unspecified: Secondary | ICD-10-CM | POA: Insufficient documentation

## 2013-01-09 DIAGNOSIS — I428 Other cardiomyopathies: Secondary | ICD-10-CM | POA: Insufficient documentation

## 2013-01-09 DIAGNOSIS — K219 Gastro-esophageal reflux disease without esophagitis: Secondary | ICD-10-CM | POA: Insufficient documentation

## 2013-01-09 DIAGNOSIS — E119 Type 2 diabetes mellitus without complications: Secondary | ICD-10-CM | POA: Insufficient documentation

## 2013-01-09 DIAGNOSIS — G4733 Obstructive sleep apnea (adult) (pediatric): Secondary | ICD-10-CM | POA: Insufficient documentation

## 2013-01-09 DIAGNOSIS — D126 Benign neoplasm of colon, unspecified: Secondary | ICD-10-CM | POA: Insufficient documentation

## 2013-01-09 DIAGNOSIS — Z1211 Encounter for screening for malignant neoplasm of colon: Secondary | ICD-10-CM | POA: Insufficient documentation

## 2013-01-09 DIAGNOSIS — C8589 Other specified types of non-Hodgkin lymphoma, extranodal and solid organ sites: Secondary | ICD-10-CM | POA: Insufficient documentation

## 2013-01-09 HISTORY — PX: COLONOSCOPY WITH PROPOFOL: SHX5780

## 2013-01-09 SURGERY — COLONOSCOPY WITH PROPOFOL
Anesthesia: Monitor Anesthesia Care

## 2013-01-09 MED ORDER — KETAMINE HCL 10 MG/ML IJ SOLN
INTRAMUSCULAR | Status: DC | PRN
  Administered 2013-01-09: 50 mg via INTRAVENOUS
  Administered 2013-01-09: 25 mg via INTRAVENOUS
  Administered 2013-01-09: 100 mg via INTRAVENOUS

## 2013-01-09 MED ORDER — DEXTROSE-NACL 5-0.45 % IV SOLN
INTRAVENOUS | Status: DC | PRN
  Administered 2013-01-09: 12:00:00 via INTRAVENOUS

## 2013-01-09 MED ORDER — PHENYLEPHRINE HCL 10 MG/ML IJ SOLN
INTRAMUSCULAR | Status: DC | PRN
  Administered 2013-01-09: 80 ug via INTRAVENOUS

## 2013-01-09 MED ORDER — PROPOFOL INFUSION 10 MG/ML OPTIME
INTRAVENOUS | Status: DC | PRN
  Administered 2013-01-09: 300 ug/kg/min via INTRAVENOUS

## 2013-01-09 MED ORDER — GLYCOPYRROLATE 0.2 MG/ML IJ SOLN
INTRAMUSCULAR | Status: DC | PRN
  Administered 2013-01-09 (×5): .02 mg via INTRAVENOUS

## 2013-01-09 MED ORDER — SODIUM CHLORIDE 0.9 % IV SOLN
INTRAVENOUS | Status: DC
  Administered 2013-01-09: 12:00:00 via INTRAVENOUS

## 2013-01-09 MED ORDER — MIDAZOLAM HCL 5 MG/5ML IJ SOLN
INTRAMUSCULAR | Status: DC | PRN
  Administered 2013-01-09 (×2): 1 mg via INTRAVENOUS

## 2013-01-09 MED ORDER — PROPOFOL 10 MG/ML IV BOLUS
INTRAVENOUS | Status: DC | PRN
  Administered 2013-01-09: 50 mg via INTRAVENOUS

## 2013-01-09 MED ORDER — ONDANSETRON HCL 4 MG/2ML IJ SOLN
INTRAMUSCULAR | Status: DC | PRN
  Administered 2013-01-09: 4 mg via INTRAVENOUS

## 2013-01-09 SURGICAL SUPPLY — 21 items

## 2013-01-09 NOTE — Anesthesia Postprocedure Evaluation (Signed)
  Anesthesia Post-op Note  Patient: [name] Sosa  Procedure(s) Performed: Procedure(s) (LRB): COLONOSCOPY WITH PROPOFOL (N/A)  Patient Location: PACU  Anesthesia Type: MAC  Level of Consciousness: awake and alert   Airway and Oxygen Therapy: Patient Spontanous Breathing  Post-op Pain: mild  Post-op Assessment: Post-op Vital signs reviewed, Patient's Cardiovascular Status Stable, Respiratory Function Stable, Patent Airway and No signs of Nausea or vomiting  Last Vitals:  Filed Vitals:   01/09/13 1320  BP: 100/54  Temp:   Resp: 15    Post-op Vital Signs: stable   Complications: No apparent anesthesia complications

## 2013-01-09 NOTE — Anesthesia Preprocedure Evaluation (Signed)
Anesthesia Evaluation  Patient identified by MRN, date of birth, ID band Patient awake    Reviewed: Allergy & Precautions, H&P , NPO status , Patient's Chart, lab work & pertinent test results  Airway Mallampati: III TM Distance: >3 FB Neck ROM: Full    Dental no notable dental hx. (+) Teeth Intact   Pulmonary neg pulmonary ROS, shortness of breath and with exertion, sleep apnea (no treatment) ,  breath sounds clear to auscultation  Pulmonary exam normal       Cardiovascular hypertension, Pt. on medications +CHF negative cardio ROS  Rhythm:Regular Rate:Normal     Neuro/Psych negative neurological ROS  negative psych ROS   GI/Hepatic negative GI ROS, Neg liver ROS, GERD-  Poorly Controlled,  Endo/Other  negative endocrine ROSdiabetes, Type 1, Insulin DependentMorbid obesity  Renal/GU negative Renal ROS  negative genitourinary   Musculoskeletal negative musculoskeletal ROS (+)   Abdominal   Peds negative pediatric ROS (+)  Hematology negative hematology ROS (+)   Anesthesia Other Findings   Reproductive/Obstetrics negative OB ROS                           Anesthesia Physical  Anesthesia Plan  ASA: III  Anesthesia Plan: MAC   Post-op Pain Management:    Induction:   Airway Management Planned: Simple Face Mask  Additional Equipment:   Intra-op Plan:   Post-operative Plan:   Informed Consent: I have reviewed the patients History and Physical, chart, labs and discussed the procedure including the risks, benefits and alternatives for the proposed anesthesia with the patient or authorized representative who has indicated his/her understanding and acceptance.   Dental advisory given  Plan Discussed with: CRNA  Anesthesia Plan Comments:         Anesthesia Quick Evaluation

## 2013-01-09 NOTE — Op Note (Signed)
Procedure: Screening colon  Endoscopist: Danise Edge  Premedication: Propofol administered by anesthesia  Procedure: The patient was placed in the left lateral decubitus position. The Pentax pediatric colonoscope was introduced into the rectum and advanced to the cecum. A prominent-appearing ileocecal valve was intubated and the distal ileum inspected. Colonic preparation for the exam today was good  Rectum. From the proximal rectum a 5 mm sessile polyp was removed with the cold snare and submitted for pathologic interpretation. Retroflexed view of the distal rectum was normal.  Sigmoid colon and descending colon. From the sigmoid colon, three 5 mm sessile polyps were removed with the cold snare and submitted for pathologic interpretation  Splenic flexure. Normal.  Transverse colon. Normal.  Hepatic flexure. Normal.  Ascending colon. Normal.  Cecum and ileocecal valve. Normal.  Terminal ileum. Normal.  Assessment  #1. A 5 mm sessile polyp was removed from the rectum and three 5 mm sessile polyps were removed from the sigmoid colon.  Recommendations: If colon polyps returned adenomatous pathologically, the patient should undergo a surveillance colonoscopy in 5 years. If the polyps returned nonneoplastic pathologically, the patient should undergo a repeat screening colonoscopy in 10 years.

## 2013-01-09 NOTE — Transfer of Care (Signed)
Immediate Anesthesia Transfer of Care Note  Patient: [name] Sosa  Procedure(s) Performed: Procedure(s): COLONOSCOPY WITH PROPOFOL (N/A)  Patient Location: PACU  Anesthesia Type:MAC  Level of Consciousness: Patient easily awoken, sedated, comfortable, cooperative, following commands, responds to stimulation.   Airway & Oxygen Therapy: Patient spontaneously breathing, ventilating well, oxygen via simple oxygen mask.  Post-op Assessment: Report given to PACU RN, vital signs reviewed and stable, moving all extremities.   Post vital signs: Reviewed and stable.  Complications: No apparent anesthesia complications

## 2013-01-09 NOTE — H&P (Signed)
  Procedure: Screening colonoscopy  History: The patient is a 52 year old male for [birth year]. The patient is scheduled to undergo  screening colonoscopy with polypectomy to prevent colon cancer. In 2007, the patient was diagnosed with B cell lymphoma. The patient has morbid obesity and is undergoing evaluation for weight loss surgery.  On 09/10/2005 the patient underwent a colonoscopy with biopsy of a cecal tumor which returned diffuse large B-cell lymphoma.  Past medical history: Type 2 diabetes mellitus. Morbid obesity. Hypercholesterolemia. Gastroesophageal reflux. Obstructive sleep apnea syndrome. Nonischemic cardiomyopathy. Thyroid nodule. Right hydrocele surgery.  Exam: The patient is alert and lying comfortably on the endoscopy stretcher. Abdomen is soft and nontender to palpation. Lungs are clear to auscultation. Cardiac exam reveals a regular rhythm.  Plan: Proceed with screening colonoscopy.

## 2013-01-10 ENCOUNTER — Encounter (HOSPITAL_COMMUNITY): Payer: Self-pay | Admitting: Gastroenterology

## 2013-01-19 ENCOUNTER — Other Ambulatory Visit (INDEPENDENT_AMBULATORY_CARE_PROVIDER_SITE_OTHER): Payer: Self-pay | Admitting: General Surgery

## 2013-01-23 ENCOUNTER — Encounter (INDEPENDENT_AMBULATORY_CARE_PROVIDER_SITE_OTHER): Payer: Self-pay

## 2013-01-24 ENCOUNTER — Encounter (INDEPENDENT_AMBULATORY_CARE_PROVIDER_SITE_OTHER): Payer: Self-pay

## 2013-01-25 ENCOUNTER — Other Ambulatory Visit: Payer: Self-pay

## 2013-02-22 ENCOUNTER — Ambulatory Visit (INDEPENDENT_AMBULATORY_CARE_PROVIDER_SITE_OTHER): Payer: 59 | Admitting: General Surgery

## 2013-02-22 ENCOUNTER — Encounter: Payer: 59 | Attending: General Surgery

## 2013-02-22 ENCOUNTER — Encounter (INDEPENDENT_AMBULATORY_CARE_PROVIDER_SITE_OTHER): Payer: Self-pay | Admitting: General Surgery

## 2013-02-22 VITALS — BP 140/78 | HR 84 | Resp 25 | Ht 73.0 in | Wt >= 6400 oz

## 2013-02-22 DIAGNOSIS — Z713 Dietary counseling and surveillance: Secondary | ICD-10-CM | POA: Insufficient documentation

## 2013-02-22 DIAGNOSIS — Z6841 Body Mass Index (BMI) 40.0 and over, adult: Secondary | ICD-10-CM

## 2013-02-22 NOTE — Patient Instructions (Signed)
Keep up the good work Keep up with the preop diet Bring cpap mask to hospital Please call or email me with any questions Perform bowel prep the day before surgery

## 2013-02-22 NOTE — Progress Notes (Signed)
Patient ID: [name] Sosa, male   DOB: 03/14/1961, 52 y.o.   MRN: [id number]  Chief Complaint  Patient presents with  . Bariatric Pre-op    RNY scheduled 03/12/13    HPI [name] Sosa is a 52 y.o. male.   HPI 52-year-old morbidly obese African American male comes in today for his preoperative appointment. He is currently scheduled to undergo a laparoscopic Roux-en-Y gastric bypass surgery on December 15. I last saw him in the office September 29. He is to restart his preoperative diet. He states that he was 437 pounds when he started his diet. He is 4 and 31 pounds today. He denies any significant medical changes since I saw him on September 29. Since that time he has gone back on CPAP and reports better compliance. He reports better energy since going back on CPAP. He also had a colonoscopy as part of followup for his abdominal and retroperitoneal non-Hodgkin's lymphoma. He was found to have only a tubular adenoma. He has also been cleared by cardiology for surgery.  Past Medical History  Diagnosis Date  . Cardiomyopathy, nonischemic     DX 2003 CARDIAC CATH, ECHO 2006 SHOWED EF IMPROVED TO 45-50%--PER CARDIOLOGY OFFICE NOTES DR. H. SMITH FROM 01/20/10  . Diabetes mellitus   . Hyperlipidemia   . GERD (gastroesophageal reflux disease)   . Multiple thyroid nodules     HX OF NODULES=PT STATES BIOPSY-TOLD ALL OKAY  . Hydrocele     HX OF RT HYDROCELE FOR YEARS-NOW ENLARGED-PLANS SURGERY  . CHF (congestive heart failure) 2003  . Anxiety     HX OF ANXIETY ATTACKS--CAN'T SLEEP ON BACK-FEELS LIKE HE CAN'T BREATHE  . Chronic kidney disease     HX OF CHRONIC KIDNEY DISEASE STAGE III - EVALUATED AT [facility] IN THE PAST AND FELT TO BE RELATED TO NSAID'S AND CHEMO.  PT  STATES HE HAS NOT SEEN KIDNEY SPECIALIST AT [facility] IN OVER A YEAR.  . Non-Hodgkin lymphoma     DX ABOUT 2007-TX'D WITH CHEMO-IN REMISSION  . Cancer     HX OF NON HODGKIN'S LYMPHOMA -IN REMISSION  . Apnea, sleep     Past  Surgical History  Procedure Laterality Date  . Hydrocele excision  02/09/2011    Procedure: HYDROCELECTOMY ADULT;  Surgeon: Matthew Ramsey Eskridge, MD;  Location: [facility];  Service: Urology;  Laterality: Right;  . Colonoscopy with propofol N/A 01/09/2013    Procedure: COLONOSCOPY WITH PROPOFOL;  Surgeon: Martin K Johnson, MD;  Location: [facility];  Service: Endoscopy;  Laterality: N/A;    Family History  Problem Relation Age of Onset  . Adopted: Yes    Social History History  Substance Use Topics  . Smoking status: Never Smoker   . Smokeless tobacco: Never Used  . Alcohol Use: Yes     Comment: RARELY    No Known Allergies  Current Outpatient Prescriptions  Medication Sig Dispense Refill  . acetaminophen (TYLENOL) 500 MG tablet Take 1,000 mg by mouth every 6 (six) hours as needed. pain      . ALPRAZolam (XANAX) 0.5 MG tablet Take 0.5 mg by mouth 2 (two) times daily as needed for anxiety.       . aspirin EC 81 MG tablet Take 1 tablet (81 mg total) by mouth every morning.      . Azelastine-Fluticasone (DYMISTA) 137-50 MCG/ACT SUSP Place 1 spray into the nose 2 (two) times daily.      . carvedilol (COREG) 25 MG tablet Take 25 mg   by mouth 2 (two) times daily with a meal.       . ferrous sulfate 325 (65 FE) MG tablet Take 325 mg by mouth 2 (two) times daily.       . insulin glargine (LANTUS) 100 UNIT/ML injection Inject 120 Units into the skin at bedtime.       . insulin lispro (HUMALOG) 100 UNIT/ML injection Inject 60 Units into the skin 3 (three) times daily before meals.       . levocetirizine (XYZAL) 5 MG tablet Take 5 mg by mouth every evening.      . montelukast (SINGULAIR) 10 MG tablet Take 10 mg by mouth at bedtime.      . Multiple Vitamin (MULTIVITAMIN WITH MINERALS) TABS tablet Take 1 tablet by mouth daily.      . omega-3 acid ethyl esters (LOVAZA) 1 G capsule Take 1 g by mouth 2 (two) times daily.       . ramipril (ALTACE) 10 MG capsule Take 10 mg by mouth every morning.        . zolpidem (AMBIEN) 10 MG tablet        No current facility-administered medications for this visit.    Review of Systems Review of Systems  Constitutional: Negative for fever, chills, appetite change and unexpected weight change.  HENT: Negative for congestion and trouble swallowing.   Eyes: Negative for visual disturbance.  Respiratory: Negative for chest tightness and shortness of breath.        +OSA, back on CPAP now, more energy  Cardiovascular: Negative for chest pain and leg swelling.       No PND, no orthopnea, some DOE. Saw Dr H Smith, had echo and was cleared for surgery, EF 45%, some b/l LE swelling  Gastrointestinal:       See HPI  Endocrine:       +IDDM, >15yrs.   Genitourinary: Negative for dysuria and hematuria.       CKD  Musculoskeletal: Positive for back pain.       L Hip pain  Skin: Negative for rash.  Neurological: Negative for seizures and speech difficulty.  Hematological: Does not bruise/bleed easily.  Psychiatric/Behavioral: Negative for behavioral problems and confusion.    Blood pressure 140/78, pulse 84, resp. rate 25, height 6' 1" (1.854 m), weight 431 lb (195.5 kg).  Physical Exam Physical Exam  Vitals reviewed. Constitutional: He is oriented to person, place, and time. He appears well-developed and well-nourished. No distress.  Morbidly obese  HENT:  Head: Normocephalic and atraumatic.  Right Ear: External ear normal.  Left Ear: External ear normal.  Short thick neck  Eyes: Conjunctivae are normal. No scleral icterus.  Neck: Normal range of motion. Neck supple. No tracheal deviation present. No thyromegaly present.  Cardiovascular: Normal rate, normal heart sounds and intact distal pulses.   Pulmonary/Chest: Effort normal and breath sounds normal. No respiratory distress. He has no wheezes.  Abdominal: Soft. He exhibits no distension. There is no tenderness. There is no rebound and no guarding.  Morbidly obese  Musculoskeletal: Normal  range of motion. He exhibits no edema and no tenderness.  Lymphadenopathy:    He has no cervical adenopathy.  Neurological: He is alert and oriented to person, place, and time. He exhibits normal muscle tone.  Skin: Skin is warm and dry. No rash noted. He is not diaphoretic. No erythema. No pallor.  Thick woody b/l LE skin  Psychiatric: He has a normal mood and affect. His behavior is normal. Judgment and   thought content normal.    Data Reviewed My office notes' cxr - negative ugi - no hernia - mild reflux abd u/s - fatty liver cscopy report & path - polyp - tubular adenoma Cardiac clearance from dr H Smith Echo results - EF 45% Evaluation labs - nml lipids, A1C 8.1, CMET wnl except for Cr 1.91 (stable), CBC - hgb 11.7, hct 36.6 (stable)  Assessment    Morbid obesity BMI 56 OSA on CPAP IDDM NICM/CHF GERD - no meds H/o HPL CKD H/o non-hodgkin lymphoma     Plan    I congratulated him on his weight loss. We discussed the importance of ongoing compliance with the preoperative diet. I explained while it is important with respect to decreasing the size of his liver to make surgery safer and easier. We discussed the preoperative testing and discuss his results. We also reviewed the preoperative plan as well as interoperative steps during surgery. He was re\re shown diagrams. We also discussed the typical postoperative course. I explained that I would probably place him in the step down unit after surgery because of his multiple medical comorbidities. I also explained that he may end up staying in the hospital a day or 2 longer just to monitor him primarily because of his chronic kidney disease and history of heart disease. We also talked about the typical postoperative course. I explained that he would have to follow with Dr. Griffin a little more frequently than typically because of the rapidly changing insulin requirements after surgery. All of his questions were asked and answered. He  was given his preoperative bowel prep instructions. He was also given a code to access the gastric bypass Emmi educational video. He was instructed to contact the office if he has any additional questions or concerns prior to surgery.  Daymien Goth M. Roxie Gueye, MD, FACS General, Bariatric, & Minimally Invasive Surgery [facility]        Mairany Bruno M 02/22/2013, 9:23 AM    

## 2013-02-23 ENCOUNTER — Encounter (HOSPITAL_COMMUNITY): Payer: Self-pay | Admitting: Pharmacy Technician

## 2013-02-23 NOTE — Progress Notes (Signed)
Pre-Operative Nutrition Class:  Appt start time: 1730   End time:  1930  Patient was seen on 02/22/2013 for Pre-Operative Bariatric Surgery Education at the [facility].   Surgery date: 03/05/2013 Surgery type: Gastric Bypass  The following the learning objectives were met by the patient during this course:  Identify Pre-Op Dietary Goals and will begin 2 weeks pre-operatively  Identify appropriate sources of fluids and proteins   State protein recommendations and appropriate sources pre and post-operatively  Identify Post-Operative Dietary Goals and will follow for 2 weeks post-operatively  Identify appropriate multivitamin and calcium sources  Describe the need for physical activity post-operatively and will follow MD recommendations  State when to call healthcare provider regarding medication questions or post-operative complications  Handouts given during class include:  Pre-Op Bariatric Surgery Diet Handout  Protein Shake Handout  Post-Op Bariatric Surgery Nutrition Handout  BELT Program Information Flyer  Support Group Information Flyer  [facility] Bariatric Supplements Price List  Follow-Up Plan: Patient will follow-up at [facility] 2 weeks post operatively for diet advancement per MD.   

## 2013-02-27 ENCOUNTER — Encounter (HOSPITAL_COMMUNITY)
Admission: RE | Admit: 2013-02-27 | Discharge: 2013-02-27 | Disposition: A | Discharge status: Home / Self Care | Payer: 59 | Source: Ambulatory Visit | Attending: General Surgery | Admitting: General Surgery

## 2013-02-27 ENCOUNTER — Encounter (HOSPITAL_COMMUNITY): Payer: Self-pay

## 2013-02-27 DIAGNOSIS — Z01812 Encounter for preprocedural laboratory examination: Secondary | ICD-10-CM | POA: Insufficient documentation

## 2013-02-27 DIAGNOSIS — Z01818 Encounter for other preprocedural examination: Secondary | ICD-10-CM | POA: Insufficient documentation

## 2013-02-27 HISTORY — DX: Chronic systolic (congestive) heart failure: I50.22

## 2013-02-27 HISTORY — DX: Morbid (severe) obesity due to excess calories: E66.01

## 2013-02-27 HISTORY — DX: Anemia, unspecified: D64.9

## 2013-02-27 LAB — CBC WITH DIFFERENTIAL/PLATELET
Basophils Absolute: 0 10*3/uL (ref 0.0–0.1)
Basophils Relative: 0 % (ref 0–1)
Eosinophils Absolute: 0.1 10*3/uL (ref 0.0–0.7)
Eosinophils Relative: 1 % (ref 0–5)
HCT: 35.7 % — ABNORMAL LOW (ref 39.0–52.0)
Hemoglobin: 11.2 g/dL — ABNORMAL LOW (ref 13.0–17.0)
Lymphocytes Relative: 26 % (ref 12–46)
MCH: 24.9 pg — ABNORMAL LOW (ref 26.0–34.0)
MCHC: 31.4 g/dL (ref 30.0–36.0)
MCV: 79.3 fL (ref 78.0–100.0)
Monocytes Relative: 13 % — ABNORMAL HIGH (ref 3–12)
Platelets: 308 10*3/uL (ref 150–400)
RDW: 16.5 % — ABNORMAL HIGH (ref 11.5–15.5)
WBC: 7.2 10*3/uL (ref 4.0–10.5)

## 2013-02-27 LAB — COMPREHENSIVE METABOLIC PANEL
ALT: 17 U/L (ref 0–53)
AST: 20 U/L (ref 0–37)
Alkaline Phosphatase: 80 U/L (ref 39–117)
BUN: 30 mg/dL — ABNORMAL HIGH (ref 6–23)
CO2: 23 mEq/L (ref 19–32)
Chloride: 99 mEq/L (ref 96–112)
Creatinine, Ser: 1.9 mg/dL — ABNORMAL HIGH (ref 0.50–1.35)
GFR calc non Af Amer: 38 mL/min — ABNORMAL LOW (ref >90)
Potassium: 4.5 mEq/L (ref 3.5–5.1)
Total Bilirubin: 0.3 mg/dL (ref 0.3–1.2)
Total Protein: 7.9 g/dL (ref 6.0–8.3)

## 2013-02-27 NOTE — Pre-Procedure Instructions (Signed)
BARI BED WITH TRAPEZE REQUESTED WITH CHRISTINE IN PORTABLE EQUIPMENT FOR DAY OF SURGERY.

## 2013-02-27 NOTE — Patient Instructions (Addendum)
YOUR SURGERY IS SCHEDULED AT [facility]  ON:  Monday  12/15  REPORT TO  SHORT STAY CENTER AT:   8:30 AM      PHONE # FOR SHORT STAY IS [phone]  STOP ASPIRIN,  NSAID'S ( LIKE IBUPROFEN, MOTRIN, ETC ), HERBALS, BLOOD THINNERS 7 DAYS BEFORE YOUR SURGERY  FOLLOW YOUR BOWEL PREP INSTRUCTIONS DAY BEFORE SURGERY - INSTRUCTIONS FROM DR. Tawana Scale OFFICE.  DO NOT EAT OR DRINK ANYTHING AFTER MIDNIGHT THE NIGHT BEFORE YOUR SURGERY.  YOU MAY BRUSH YOUR TEETH, RINSE OUT YOUR MOUTH--BUT NO WATER, NO FOOD, NO CHEWING GUM, NO MINTS, NO CANDIES, NO CHEWING TOBACCO.  PLEASE TAKE THE FOLLOWING MEDICATIONS THE AM OF YOUR SURGERY WITH A FEW SIPS OF WATER:  CARVEDILOL ( COREG ), XANAX IF NEEDED.  MAY USE YOUR DYMISTA NASAL SPRAY   IF YOU ARE DIABETIC:  DO NOT TAKE ANY DIABETIC MEDICATIONS THE AM OF YOUR SURGERY.  IF YOU TAKE INSULIN IN THE EVENINGS--PLEASE ONLY TAKE 1/2 NORMAL EVENING DOSE THE NIGHT BEFORE YOUR SURGERY.  NO INSULIN THE AM OF YOUR SURGERY. IF YOU HAVE SLEEP APNEA AND USE CPAP OR BIPAP--PLEASE BRING THE MASK AND THE TUBING.  DO NOT BRING YOUR MACHINE.  DO NOT BRING VALUABLES, MONEY, CREDIT CARDS.  DO NOT WEAR JEWELRY, MAKE-UP, NAIL POLISH AND NO METAL PINS OR CLIPS IN YOUR HAIR. CONTACT LENS, DENTURES / PARTIALS, GLASSES SHOULD NOT BE WORN TO SURGERY AND IN MOST CASES-HEARING AIDS WILL NEED TO BE REMOVED.  BRING YOUR GLASSES CASE, ANY EQUIPMENT NEEDED FOR YOUR CONTACT LENS. FOR PATIENTS ADMITTED TO THE HOSPITAL--CHECK OUT TIME THE DAY OF DISCHARGE IS 11:00 AM.  ALL INPATIENT ROOMS ARE PRIVATE - WITH BATHROOM, TELEPHONE, TELEVISION AND WIFI INTERNET.    FAILURE TO FOLLOW THESE INSTRUCTIONS MAY RESULT IN THE CANCELLATION OF YOUR SURGERY. PLEASE BE AWARE THAT YOU MAY NEED ADDITIONAL BLOOD DRAWN DAY OF YOUR SURGERY  PATIENT SIGNATURE_________________________________

## 2013-02-27 NOTE — Pre-Procedure Instructions (Signed)
PT HAS CXR REPORT IN EPIC FROM 12/21/12. PT HAS EKG REPORT AND CARDIOLOGY OFFICE NOTES FROM 12/18/12 ON CHART FROM DR. Mendel Ryder. PT HAS ECHO REPORT AND CLEARANCE FOR SURGERY 12/29/12 IN EPIC FROM DR. Mendel Ryder. PT'S SLEEP REPORT 12/08/12 ON HIS CHART FROM [facility]. OFFICE NOTE 08/31/12 FROM DR. J. GRIFFIN ON PT'S CHART.

## 2013-02-28 NOTE — Pre-Procedure Instructions (Signed)
PT'S PREOP CMET REPORT IN EPIC - ABNORMALS REVIEWED IN EPIC BY DR. Andrey Campanile

## 2013-03-01 ENCOUNTER — Ambulatory Visit (INDEPENDENT_AMBULATORY_CARE_PROVIDER_SITE_OTHER): Payer: 59 | Admitting: General Surgery

## 2013-03-05 ENCOUNTER — Encounter (HOSPITAL_COMMUNITY)
Admission: RE | Disposition: A | Discharge status: Home / Self Care | Payer: Self-pay | Source: Ambulatory Visit | Attending: General Surgery

## 2013-03-05 ENCOUNTER — Inpatient Hospital Stay (HOSPITAL_COMMUNITY)
Admission: RE | Admit: 2013-03-05 | Discharge: 2013-03-07 | DRG: 620 | Disposition: A | Discharge status: Home / Self Care | Payer: 59 | Source: Ambulatory Visit | Attending: General Surgery | Admitting: General Surgery

## 2013-03-05 ENCOUNTER — Encounter (HOSPITAL_COMMUNITY): Payer: Self-pay

## 2013-03-05 ENCOUNTER — Inpatient Hospital Stay (HOSPITAL_COMMUNITY): Payer: 59 | Admitting: Registered Nurse

## 2013-03-05 ENCOUNTER — Encounter (HOSPITAL_COMMUNITY): Payer: 59 | Admitting: Registered Nurse

## 2013-03-05 DIAGNOSIS — E119 Type 2 diabetes mellitus without complications: Secondary | ICD-10-CM | POA: Diagnosis present

## 2013-03-05 DIAGNOSIS — E875 Hyperkalemia: Secondary | ICD-10-CM | POA: Diagnosis not present

## 2013-03-05 DIAGNOSIS — I428 Other cardiomyopathies: Secondary | ICD-10-CM | POA: Diagnosis present

## 2013-03-05 DIAGNOSIS — K660 Peritoneal adhesions (postprocedural) (postinfection): Secondary | ICD-10-CM | POA: Diagnosis present

## 2013-03-05 DIAGNOSIS — Z01812 Encounter for preprocedural laboratory examination: Secondary | ICD-10-CM

## 2013-03-05 DIAGNOSIS — Z7982 Long term (current) use of aspirin: Secondary | ICD-10-CM

## 2013-03-05 DIAGNOSIS — C8589 Other specified types of non-Hodgkin lymphoma, extranodal and solid organ sites: Secondary | ICD-10-CM | POA: Diagnosis present

## 2013-03-05 DIAGNOSIS — K219 Gastro-esophageal reflux disease without esophagitis: Secondary | ICD-10-CM | POA: Diagnosis present

## 2013-03-05 DIAGNOSIS — N433 Hydrocele, unspecified: Secondary | ICD-10-CM | POA: Diagnosis present

## 2013-03-05 DIAGNOSIS — F410 Panic disorder [episodic paroxysmal anxiety] without agoraphobia: Secondary | ICD-10-CM | POA: Diagnosis present

## 2013-03-05 DIAGNOSIS — G4733 Obstructive sleep apnea (adult) (pediatric): Secondary | ICD-10-CM

## 2013-03-05 DIAGNOSIS — I129 Hypertensive chronic kidney disease with stage 1 through stage 4 chronic kidney disease, or unspecified chronic kidney disease: Secondary | ICD-10-CM | POA: Diagnosis present

## 2013-03-05 DIAGNOSIS — Z79899 Other long term (current) drug therapy: Secondary | ICD-10-CM

## 2013-03-05 DIAGNOSIS — I5022 Chronic systolic (congestive) heart failure: Secondary | ICD-10-CM | POA: Diagnosis present

## 2013-03-05 DIAGNOSIS — E785 Hyperlipidemia, unspecified: Secondary | ICD-10-CM | POA: Diagnosis present

## 2013-03-05 DIAGNOSIS — Z6841 Body Mass Index (BMI) 40.0 and over, adult: Secondary | ICD-10-CM

## 2013-03-05 DIAGNOSIS — N183 Chronic kidney disease, stage 3 unspecified: Secondary | ICD-10-CM | POA: Diagnosis present

## 2013-03-05 DIAGNOSIS — Z794 Long term (current) use of insulin: Secondary | ICD-10-CM

## 2013-03-05 DIAGNOSIS — IMO0001 Reserved for inherently not codable concepts without codable children: Secondary | ICD-10-CM

## 2013-03-05 DIAGNOSIS — I509 Heart failure, unspecified: Secondary | ICD-10-CM | POA: Diagnosis present

## 2013-03-05 HISTORY — PX: GASTRIC ROUX-EN-Y: SHX5262

## 2013-03-05 HISTORY — DX: Claustrophobia: F40.240

## 2013-03-05 LAB — HEMOGLOBIN AND HEMATOCRIT, BLOOD
HCT: 34.8 % — ABNORMAL LOW (ref 39.0–52.0)
Hemoglobin: 10.9 g/dL — ABNORMAL LOW (ref 13.0–17.0)

## 2013-03-05 LAB — GLUCOSE, CAPILLARY
Glucose-Capillary: 163 mg/dL — ABNORMAL HIGH (ref 70–99)
Glucose-Capillary: 171 mg/dL — ABNORMAL HIGH (ref 70–99)

## 2013-03-05 LAB — HEMOGLOBIN A1C
Hgb A1c MFr Bld: 7.8 % — ABNORMAL HIGH (ref <5.7)
Mean Plasma Glucose: 177 mg/dL — ABNORMAL HIGH (ref <117)

## 2013-03-05 SURGERY — LAPAROSCOPIC ROUX-EN-Y GASTRIC BYPASS WITH UPPER ENDOSCOPY
Anesthesia: General | Site: Abdomen

## 2013-03-05 MED ORDER — ACETAMINOPHEN 10 MG/ML IV SOLN
1000.0000 mg | Freq: Four times a day (QID) | INTRAVENOUS | Status: AC
  Administered 2013-03-05 – 2013-03-06 (×4): 1000 mg via INTRAVENOUS
  Filled 2013-03-05 (×6): qty 100

## 2013-03-05 MED ORDER — OXYCODONE-ACETAMINOPHEN 5-325 MG/5ML PO SOLN: 5.0000 mL | ORAL | Status: DC | PRN

## 2013-03-05 MED ORDER — ROCURONIUM BROMIDE 100 MG/10ML IV SOLN
INTRAVENOUS | Status: AC
  Filled 2013-03-05: qty 1

## 2013-03-05 MED ORDER — LIDOCAINE HCL (CARDIAC) 20 MG/ML IV SOLN
INTRAVENOUS | Status: DC | PRN
  Administered 2013-03-05: 100 mg via INTRAVENOUS

## 2013-03-05 MED ORDER — DEXTROSE 5 % IV SOLN
INTRAVENOUS | Status: AC
  Filled 2013-03-05 (×2): qty 1

## 2013-03-05 MED ORDER — PHENYLEPHRINE 40 MCG/ML (10ML) SYRINGE FOR IV PUSH (FOR BLOOD PRESSURE SUPPORT)
PREFILLED_SYRINGE | INTRAVENOUS | Status: AC
  Filled 2013-03-05: qty 10

## 2013-03-05 MED ORDER — GLYCOPYRROLATE 0.2 MG/ML IJ SOLN
INTRAMUSCULAR | Status: DC | PRN
  Administered 2013-03-05: .8 mg via INTRAVENOUS

## 2013-03-05 MED ORDER — NEOSTIGMINE METHYLSULFATE 1 MG/ML IJ SOLN
INTRAMUSCULAR | Status: DC | PRN
  Administered 2013-03-05: 5 mg via INTRAVENOUS

## 2013-03-05 MED ORDER — PROPOFOL 10 MG/ML IV BOLUS
INTRAVENOUS | Status: DC | PRN
  Administered 2013-03-05: 250 mg via INTRAVENOUS

## 2013-03-05 MED ORDER — PANTOPRAZOLE SODIUM 40 MG IV SOLR
40.0000 mg | INTRAVENOUS | Status: DC
  Administered 2013-03-05 – 2013-03-06 (×2): 40 mg via INTRAVENOUS
  Filled 2013-03-05 (×3): qty 40

## 2013-03-05 MED ORDER — HYDROMORPHONE HCL PF 1 MG/ML IJ SOLN
0.2500 mg | INTRAMUSCULAR | Status: DC | PRN
  Administered 2013-03-05 (×2): 0.25 mg via INTRAVENOUS
  Administered 2013-03-05: 0.5 mg via INTRAVENOUS

## 2013-03-05 MED ORDER — LIDOCAINE HCL (CARDIAC) 20 MG/ML IV SOLN
INTRAVENOUS | Status: AC
  Filled 2013-03-05: qty 5

## 2013-03-05 MED ORDER — MIDAZOLAM HCL 2 MG/2ML IJ SOLN
INTRAMUSCULAR | Status: AC
  Filled 2013-03-05: qty 2

## 2013-03-05 MED ORDER — LACTATED RINGERS IR SOLN
Status: DC | PRN
  Administered 2013-03-05: 1

## 2013-03-05 MED ORDER — TISSEEL VH 10 ML EX KIT
PACK | CUTANEOUS | Status: DC | PRN
  Administered 2013-03-05: 10 mL

## 2013-03-05 MED ORDER — GLYCOPYRROLATE 0.2 MG/ML IJ SOLN
INTRAMUSCULAR | Status: AC
  Filled 2013-03-05: qty 4

## 2013-03-05 MED ORDER — BIOTENE DRY MOUTH MT LIQD
15.0000 mL | Freq: Two times a day (BID) | OROMUCOSAL | Status: DC
  Administered 2013-03-06 – 2013-03-07 (×3): 15 mL via OROMUCOSAL

## 2013-03-05 MED ORDER — HEPARIN SODIUM (PORCINE) 5000 UNIT/ML IJ SOLN
5000.0000 [IU] | INTRAMUSCULAR | Status: AC
  Administered 2013-03-05: 5000 [IU] via SUBCUTANEOUS
  Filled 2013-03-05: qty 1

## 2013-03-05 MED ORDER — BUPIVACAINE-EPINEPHRINE 0.25% -1:200000 IJ SOLN
INTRAMUSCULAR | Status: DC | PRN
  Administered 2013-03-05: 30 mL

## 2013-03-05 MED ORDER — ENOXAPARIN SODIUM 40 MG/0.4ML ~~LOC~~ SOLN
40.0000 mg | Freq: Two times a day (BID) | SUBCUTANEOUS | Status: DC
  Administered 2013-03-06 – 2013-03-07 (×3): 40 mg via SUBCUTANEOUS
  Filled 2013-03-05 (×6): qty 0.4

## 2013-03-05 MED ORDER — ACETAMINOPHEN 160 MG/5ML PO SOLN: 650.0000 mg | ORAL | Status: DC | PRN

## 2013-03-05 MED ORDER — HYDROMORPHONE HCL PF 1 MG/ML IJ SOLN
INTRAMUSCULAR | Status: AC
  Filled 2013-03-05: qty 1

## 2013-03-05 MED ORDER — UNJURY CHOCOLATE CLASSIC POWDER
2.0000 [oz_av] | Freq: Four times a day (QID) | ORAL | Status: DC
  Administered 2013-03-07: 2 [oz_av] via ORAL
  Filled 2013-03-05 (×4): qty 27

## 2013-03-05 MED ORDER — SUCCINYLCHOLINE CHLORIDE 20 MG/ML IJ SOLN
INTRAMUSCULAR | Status: AC
  Filled 2013-03-05: qty 2

## 2013-03-05 MED ORDER — MIDAZOLAM HCL 5 MG/5ML IJ SOLN
INTRAMUSCULAR | Status: DC | PRN
  Administered 2013-03-05: 1 mg via INTRAVENOUS

## 2013-03-05 MED ORDER — SUCCINYLCHOLINE CHLORIDE 20 MG/ML IJ SOLN
INTRAMUSCULAR | Status: DC | PRN
  Administered 2013-03-05: 140 mg via INTRAVENOUS

## 2013-03-05 MED ORDER — PHENYLEPHRINE HCL 10 MG/ML IJ SOLN
INTRAMUSCULAR | Status: AC
  Filled 2013-03-05: qty 2

## 2013-03-05 MED ORDER — PHENYLEPHRINE HCL 10 MG/ML IJ SOLN
20.0000 mg | INTRAVENOUS | Status: DC | PRN
  Administered 2013-03-05: 10 ug/min via INTRAVENOUS

## 2013-03-05 MED ORDER — PROPOFOL 10 MG/ML IV BOLUS
INTRAVENOUS | Status: AC
  Filled 2013-03-05: qty 20

## 2013-03-05 MED ORDER — ROCURONIUM BROMIDE 100 MG/10ML IV SOLN
INTRAVENOUS | Status: DC | PRN
  Administered 2013-03-05: 50 mg via INTRAVENOUS
  Administered 2013-03-05: 5 mg via INTRAVENOUS
  Administered 2013-03-05: 20 mg via INTRAVENOUS
  Administered 2013-03-05 (×2): 10 mg via INTRAVENOUS
  Administered 2013-03-05: 5 mg via INTRAVENOUS

## 2013-03-05 MED ORDER — DEXTROSE 5 % IV SOLN
2.0000 g | INTRAVENOUS | Status: DC
  Filled 2013-03-05: qty 2

## 2013-03-05 MED ORDER — UNJURY VANILLA POWDER
2.0000 [oz_av] | Freq: Four times a day (QID) | ORAL | Status: DC
  Filled 2013-03-05 (×4): qty 27

## 2013-03-05 MED ORDER — AZELASTINE HCL 0.1 % NA SOLN
1.0000 | Freq: Two times a day (BID) | NASAL | Status: DC
  Administered 2013-03-05 – 2013-03-07 (×4): 1 via NASAL
  Filled 2013-03-05: qty 30

## 2013-03-05 MED ORDER — FENTANYL CITRATE 0.05 MG/ML IJ SOLN
INTRAMUSCULAR | Status: AC
  Filled 2013-03-05: qty 5

## 2013-03-05 MED ORDER — CHLORHEXIDINE GLUCONATE 0.12 % MT SOLN
15.0000 mL | Freq: Two times a day (BID) | OROMUCOSAL | Status: DC
  Administered 2013-03-06 – 2013-03-07 (×3): 15 mL via OROMUCOSAL
  Filled 2013-03-05 (×3): qty 15

## 2013-03-05 MED ORDER — 0.9 % SODIUM CHLORIDE (POUR BTL) OPTIME
TOPICAL | Status: DC | PRN
  Administered 2013-03-05: 1000 mL

## 2013-03-05 MED ORDER — POTASSIUM CHLORIDE IN NACL 20-0.45 MEQ/L-% IV SOLN
INTRAVENOUS | Status: DC
  Administered 2013-03-05: 19:00:00 via INTRAVENOUS
  Filled 2013-03-05 (×3): qty 1000

## 2013-03-05 MED ORDER — ONDANSETRON HCL 4 MG/2ML IJ SOLN: 4.0000 mg | INTRAMUSCULAR | Status: DC | PRN

## 2013-03-05 MED ORDER — UNJURY CHICKEN SOUP POWDER
2.0000 [oz_av] | Freq: Four times a day (QID) | ORAL | Status: DC
  Filled 2013-03-05 (×4): qty 27

## 2013-03-05 MED ORDER — MORPHINE SULFATE 2 MG/ML IJ SOLN
2.0000 mg | INTRAMUSCULAR | Status: DC | PRN
  Administered 2013-03-05 (×5): 2 mg via INTRAVENOUS
  Administered 2013-03-06: 4 mg via INTRAVENOUS
  Administered 2013-03-06: 2 mg via INTRAVENOUS
  Administered 2013-03-06 (×2): 4 mg via INTRAVENOUS
  Administered 2013-03-06 (×2): 2 mg via INTRAVENOUS
  Administered 2013-03-06: 4 mg via INTRAVENOUS
  Administered 2013-03-06: 2 mg via INTRAVENOUS
  Administered 2013-03-07: 4 mg via INTRAVENOUS
  Filled 2013-03-05: qty 1
  Filled 2013-03-05: qty 2
  Filled 2013-03-05: qty 1
  Filled 2013-03-05 (×4): qty 2
  Filled 2013-03-05 (×3): qty 1
  Filled 2013-03-05: qty 2
  Filled 2013-03-05 (×2): qty 1

## 2013-03-05 MED ORDER — TISSEEL VH 10 ML EX KIT
PACK | CUTANEOUS | Status: AC
  Filled 2013-03-05: qty 2

## 2013-03-05 MED ORDER — METOPROLOL TARTRATE 1 MG/ML IV SOLN
5.0000 mg | Freq: Four times a day (QID) | INTRAVENOUS | Status: DC
  Administered 2013-03-05 – 2013-03-07 (×8): 5 mg via INTRAVENOUS
  Filled 2013-03-05 (×14): qty 5

## 2013-03-05 MED ORDER — SODIUM CHLORIDE 0.9 % IV SOLN
INTRAVENOUS | Status: DC
  Administered 2013-03-05: 1000 mL via INTRAVENOUS
  Administered 2013-03-05: 100 mL/h via INTRAVENOUS

## 2013-03-05 MED ORDER — LACTATED RINGERS IV SOLN: INTRAVENOUS | Status: DC

## 2013-03-05 MED ORDER — ONDANSETRON HCL 4 MG/2ML IJ SOLN
INTRAMUSCULAR | Status: DC | PRN
  Administered 2013-03-05: 4 mg via INTRAVENOUS

## 2013-03-05 MED ORDER — DEXTROSE 5 % IV SOLN
2.0000 g | INTRAVENOUS | Status: DC | PRN
  Administered 2013-03-05 (×2): 2 g via INTRAVENOUS

## 2013-03-05 MED ORDER — STERILE WATER FOR IRRIGATION IR SOLN
Status: DC | PRN
  Administered 2013-03-05: 1

## 2013-03-05 MED ORDER — FLUTICASONE PROPIONATE 50 MCG/ACT NA SUSP
1.0000 | Freq: Two times a day (BID) | NASAL | Status: DC
  Administered 2013-03-07: 1 via NASAL
  Filled 2013-03-05: qty 16

## 2013-03-05 MED ORDER — INSULIN ASPART 100 UNIT/ML ~~LOC~~ SOLN
0.0000 [IU] | Freq: Three times a day (TID) | SUBCUTANEOUS | Status: DC
  Administered 2013-03-06: 4 [IU] via SUBCUTANEOUS
  Administered 2013-03-06 – 2013-03-07 (×2): 3 [IU] via SUBCUTANEOUS

## 2013-03-05 MED ORDER — FENTANYL CITRATE 0.05 MG/ML IJ SOLN
INTRAMUSCULAR | Status: DC | PRN
  Administered 2013-03-05 (×4): 50 ug via INTRAVENOUS

## 2013-03-05 MED ORDER — ONDANSETRON HCL 4 MG/2ML IJ SOLN
INTRAMUSCULAR | Status: AC
  Filled 2013-03-05: qty 2

## 2013-03-05 MED ORDER — BUPIVACAINE-EPINEPHRINE 0.25% -1:200000 IJ SOLN
INTRAMUSCULAR | Status: AC
  Filled 2013-03-05: qty 1

## 2013-03-05 MED ORDER — PHENYLEPHRINE HCL 10 MG/ML IJ SOLN
INTRAMUSCULAR | Status: DC | PRN
  Administered 2013-03-05 (×2): 80 ug via INTRAVENOUS

## 2013-03-05 MED ORDER — AZELASTINE-FLUTICASONE 137-50 MCG/ACT NA SUSP: 1.0000 | Freq: Two times a day (BID) | NASAL | Status: DC

## 2013-03-05 SURGICAL SUPPLY — 57 items
APPLICATOR COTTON TIP 6IN STRL (MISCELLANEOUS) IMPLANT
BLADE SURG 15 STRL LF DISP TIS (BLADE) IMPLANT
BLADE SURG 15 STRL SS (BLADE)
BLADE SURG SZ11 CARB STEEL (BLADE) ×2 IMPLANT
CABLE HIGH FREQUENCY MONO STRZ (ELECTRODE) IMPLANT
CHLORAPREP W/TINT 26ML (MISCELLANEOUS) ×4 IMPLANT
CLIP SUT LAPRA TY ABSORB (SUTURE) ×4 IMPLANT
CUTTER LINEAR ENDO ART 45 ETS (STAPLE) ×2 IMPLANT
DERMABOND ADVANCED (GAUZE/BANDAGES/DRESSINGS) ×1
DERMABOND ADVANCED .7 DNX12 (GAUZE/BANDAGES/DRESSINGS) ×1 IMPLANT
DEVICE SUTURE ENDOST 10MM (ENDOMECHANICALS) IMPLANT
DRAIN PENROSE 18X1/4 LTX STRL (WOUND CARE) ×2 IMPLANT
DRAPE CAMERA CLOSED 9X96 (DRAPES) ×2 IMPLANT
ELECT REM PT RETURN 9FT ADLT (ELECTROSURGICAL) ×2
ELECTRODE REM PT RTRN 9FT ADLT (ELECTROSURGICAL) ×1 IMPLANT
GAUZE SPONGE 4X4 16PLY XRAY LF (GAUZE/BANDAGES/DRESSINGS) ×2 IMPLANT
GLOVE BIOGEL M STRL SZ7.5 (GLOVE) IMPLANT
GOWN STRL REIN XL XLG (GOWN DISPOSABLE) ×10 IMPLANT
HOVERMATT SINGLE USE (MISCELLANEOUS) ×2 IMPLANT
KIT BASIN OR (CUSTOM PROCEDURE TRAY) ×2 IMPLANT
KIT GASTRIC LAVAGE 34FR ADT (SET/KITS/TRAYS/PACK) ×2 IMPLANT
MARKER SKIN DUAL TIP RULER LAB (MISCELLANEOUS) ×2 IMPLANT
NEEDLE SPNL 22GX3.5 QUINCKE BK (NEEDLE) ×2 IMPLANT
PACK CARDIOVASCULAR III (CUSTOM PROCEDURE TRAY) ×2 IMPLANT
RELOAD 45 VASCULAR/THIN (ENDOMECHANICALS) ×2 IMPLANT
RELOAD BLUE (STAPLE) ×8 IMPLANT
RELOAD ENDO STITCH 2.0 (ENDOMECHANICALS) ×9
RELOAD GOLD (STAPLE) ×2 IMPLANT
RELOAD STAPLE TA45 3.5 REG BLU (ENDOMECHANICALS) ×4 IMPLANT
RELOAD WHITE ECR60W (STAPLE) ×2 IMPLANT
SCISSORS LAP 5X35 DISP (ENDOMECHANICALS) ×2 IMPLANT
SEALANT SURGICAL APPL DUAL CAN (MISCELLANEOUS) ×2 IMPLANT
SET IRRIG TUBING LAPAROSCOPIC (IRRIGATION / IRRIGATOR) ×2 IMPLANT
SHEARS HARMONIC ACE PLUS 45CM (MISCELLANEOUS) ×2 IMPLANT
SLEEVE ADV FIXATION 12X100MM (TROCAR) ×4 IMPLANT
SOLUTION ANTI FOG 6CC (MISCELLANEOUS) ×2 IMPLANT
SPONGE GAUZE 4X4 12PLY (GAUZE/BANDAGES/DRESSINGS) IMPLANT
STAPLE ECHEON FLEX 60 POW ENDO (STAPLE) ×2 IMPLANT
STAPLER VISISTAT 35W (STAPLE) IMPLANT
SUT DVC SILK 2.0X39 (SUTURE) ×10 IMPLANT
SUT MNCRL AB 4-0 PS2 18 (SUTURE) ×2 IMPLANT
SUT RELOAD ENDO STITCH 2 48X1 (ENDOMECHANICALS) ×5
SUT RELOAD ENDO STITCH 2.0 (ENDOMECHANICALS) ×4
SUT VIC AB 2-0 SH 27 (SUTURE) ×1
SUT VIC AB 2-0 SH 27X BRD (SUTURE) ×1 IMPLANT
SUTURE RELOAD END STTCH 2 48X1 (ENDOMECHANICALS) ×5 IMPLANT
SUTURE RELOAD ENDO STITCH 2.0 (ENDOMECHANICALS) ×4 IMPLANT
SYR 20CC LL (SYRINGE) ×4 IMPLANT
TOWEL OR 17X26 10 PK STRL BLUE (TOWEL DISPOSABLE) ×2 IMPLANT
TOWEL OR NON WOVEN STRL DISP B (DISPOSABLE) ×2 IMPLANT
TRAY FOLEY CATH 14FRSI W/METER (CATHETERS) ×2 IMPLANT
TROCAR ADV FIXATION 12X100MM (TROCAR) ×2 IMPLANT
TROCAR ADV FIXATION 5X100MM (TROCAR) ×2 IMPLANT
TROCAR BLADELESS OPT 5 100 (ENDOMECHANICALS) ×2 IMPLANT
TROCAR XCEL 12X100 BLDLESS (ENDOMECHANICALS) ×2 IMPLANT
TUBING ENDO SMARTCAP PENTAX (MISCELLANEOUS) ×2 IMPLANT
TUBING FILTER THERMOFLATOR (ELECTROSURGICAL) ×2 IMPLANT

## 2013-03-05 NOTE — Anesthesia Preprocedure Evaluation (Addendum)
Anesthesia Evaluation  Patient identified by MRN, date of birth, ID band Patient awake    Reviewed: Allergy & Precautions, H&P , NPO status , Patient's Chart, lab work & pertinent test results  Airway Mallampati: III TM Distance: >3 FB Neck ROM: full    Dental no notable dental hx. (+) Teeth Intact and Dental Advisory Given   Pulmonary shortness of breath and with exertion, sleep apnea and Continuous Positive Airway Pressure Ventilation ,  Severe OSA per sleep study 9/14 breath sounds clear to auscultation  Pulmonary exam normal       Cardiovascular hypertension, Pt. on home beta blockers Rhythm:regular Rate:Normal  Cardiomyopathy - nonischemic.  2006 echo EF improved to 45%.  Chronic systolic heart failure with acute 2003   Neuro/Psych Anxiety Panic attacks/ claustrophobianegative neurological ROS  negative psych ROS   GI/Hepatic negative GI ROS, Neg liver ROS,   Endo/Other  diabetes, Well Controlled, Type 2, Insulin DependentMorbid obesity  Renal/GU Renal diseaseStage 3 chronic kidney disease BUN 30  CRT 1.90  negative genitourinary   Musculoskeletal   Abdominal (+) + obese,   Peds  Hematology negative hematology ROS (+)   Anesthesia Other Findings Non-Hodgkins lymphoma in remission  Reproductive/Obstetrics negative OB ROS                          Anesthesia Physical Anesthesia Plan  ASA: III  Anesthesia Plan: General   Post-op Pain Management:    Induction: Intravenous  Airway Management Planned: Oral ETT  Additional Equipment:   Intra-op Plan:   Post-operative Plan: Extubation in OR  Informed Consent: I have reviewed the patients History and Physical, chart, labs and discussed the procedure including the risks, benefits and alternatives for the proposed anesthesia with the patient or authorized representative who has indicated his/her understanding and acceptance.   Dental  Advisory Given  Plan Discussed with: CRNA and Surgeon  Anesthesia Plan Comments:         Anesthesia Quick Evaluation

## 2013-03-05 NOTE — H&P (View-Only) (Signed)
Patient ID: [name] Sosa, male   DOB: 1960-12-01, 52 y.o.   MRN: [id number]  Chief Complaint  Patient presents with  . Bariatric Pre-op    RNY scheduled 03/12/13    HPI [name] Sosa is a 52 y.o. male.   HPI 52 year old morbidly obese African American male comes in today for his preoperative appointment. He is currently scheduled to undergo a laparoscopic Roux-en-Y gastric bypass surgery on December 15. I last saw him in the office September 29. He is to restart his preoperative diet. He states that he was 437 pounds when he started his diet. He is 4 and 31 pounds today. He denies any significant medical changes since I saw him on September 29. Since that time he has gone back on CPAP and reports better compliance. He reports better energy since going back on CPAP. He also had a colonoscopy as part of followup for his abdominal and retroperitoneal non-Hodgkin's lymphoma. He was found to have only a tubular adenoma. He has also been cleared by cardiology for surgery.  Past Medical History  Diagnosis Date  . Cardiomyopathy, nonischemic     DX 2003 CARDIAC CATH, ECHO 2006 SHOWED EF IMPROVED TO 45-50%--PER CARDIOLOGY OFFICE NOTES DR. Mendel Ryder FROM 01/20/10  . Diabetes mellitus   . Hyperlipidemia   . GERD (gastroesophageal reflux disease)   . Multiple thyroid nodules     HX OF NODULES=PT STATES BIOPSY-TOLD ALL OKAY  . Hydrocele     HX OF RT HYDROCELE FOR YEARS-NOW ENLARGED-PLANS SURGERY  . CHF (congestive heart failure) 2003  . Anxiety     HX OF ANXIETY ATTACKS--CAN'T SLEEP ON BACK-FEELS LIKE HE CAN'T BREATHE  . Chronic kidney disease     HX OF CHRONIC KIDNEY DISEASE STAGE III - EVALUATED AT [facility] IN THE PAST AND FELT TO BE RELATED TO NSAID'S AND CHEMO.  PT  STATES HE HAS NOT SEEN KIDNEY SPECIALIST AT [facility] IN OVER A YEAR.  . Non-Hodgkin lymphoma     DX ABOUT 2007-TX'D WITH CHEMO-IN REMISSION  . Cancer     HX OF NON HODGKIN'S LYMPHOMA -IN REMISSION  . Apnea, sleep     Past  Surgical History  Procedure Laterality Date  . Hydrocele excision  02/09/2011    Procedure: HYDROCELECTOMY ADULT;  Surgeon: Antony Haste, MD;  Location: [facility];  Service: Urology;  Laterality: Right;  . Colonoscopy with propofol N/A 01/09/2013    Procedure: COLONOSCOPY WITH PROPOFOL;  Surgeon: Charolett Bumpers, MD;  Location: [facility];  Service: Endoscopy;  Laterality: N/A;    Family History  Problem Relation Age of Onset  . Adopted: Yes    Social History History  Substance Use Topics  . Smoking status: Never Smoker   . Smokeless tobacco: Never Used  . Alcohol Use: Yes     Comment: RARELY    No Known Allergies  Current Outpatient Prescriptions  Medication Sig Dispense Refill  . acetaminophen (TYLENOL) 500 MG tablet Take 1,000 mg by mouth every 6 (six) hours as needed. pain      . ALPRAZolam (XANAX) 0.5 MG tablet Take 0.5 mg by mouth 2 (two) times daily as needed for anxiety.       Marland Kitchen aspirin EC 81 MG tablet Take 1 tablet (81 mg total) by mouth every morning.      . Azelastine-Fluticasone (DYMISTA) 137-50 MCG/ACT SUSP Place 1 spray into the nose 2 (two) times daily.      . carvedilol (COREG) 25 MG tablet Take 25 mg  by mouth 2 (two) times daily with a meal.       . ferrous sulfate 325 (65 FE) MG tablet Take 325 mg by mouth 2 (two) times daily.       . insulin glargine (LANTUS) 100 UNIT/ML injection Inject 120 Units into the skin at bedtime.       . insulin lispro (HUMALOG) 100 UNIT/ML injection Inject 60 Units into the skin 3 (three) times daily before meals.       Marland Kitchen levocetirizine (XYZAL) 5 MG tablet Take 5 mg by mouth every evening.      . montelukast (SINGULAIR) 10 MG tablet Take 10 mg by mouth at bedtime.      . Multiple Vitamin (MULTIVITAMIN WITH MINERALS) TABS tablet Take 1 tablet by mouth daily.      Marland Kitchen omega-3 acid ethyl esters (LOVAZA) 1 G capsule Take 1 g by mouth 2 (two) times daily.       . ramipril (ALTACE) 10 MG capsule Take 10 mg by mouth every morning.        . zolpidem (AMBIEN) 10 MG tablet        No current facility-administered medications for this visit.    Review of Systems Review of Systems  Constitutional: Negative for fever, chills, appetite change and unexpected weight change.  HENT: Negative for congestion and trouble swallowing.   Eyes: Negative for visual disturbance.  Respiratory: Negative for chest tightness and shortness of breath.        +OSA, back on CPAP now, more energy  Cardiovascular: Negative for chest pain and leg swelling.       No PND, no orthopnea, some DOE. Saw Dr Mendel Ryder, had echo and was cleared for surgery, EF 45%, some b/l LE swelling  Gastrointestinal:       See HPI  Endocrine:       +IDDM, >74yrs.   Genitourinary: Negative for dysuria and hematuria.       CKD  Musculoskeletal: Positive for back pain.       L Hip pain  Skin: Negative for rash.  Neurological: Negative for seizures and speech difficulty.  Hematological: Does not bruise/bleed easily.  Psychiatric/Behavioral: Negative for behavioral problems and confusion.    Blood pressure 140/78, pulse 84, resp. rate 25, height 6\' 1"  (1.854 m), weight 431 lb (195.5 kg).  Physical Exam Physical Exam  Vitals reviewed. Constitutional: He is oriented to person, place, and time. He appears well-developed and well-nourished. No distress.  Morbidly obese  HENT:  Head: Normocephalic and atraumatic.  Right Ear: External ear normal.  Left Ear: External ear normal.  Short thick neck  Eyes: Conjunctivae are normal. No scleral icterus.  Neck: Normal range of motion. Neck supple. No tracheal deviation present. No thyromegaly present.  Cardiovascular: Normal rate, normal heart sounds and intact distal pulses.   Pulmonary/Chest: Effort normal and breath sounds normal. No respiratory distress. He has no wheezes.  Abdominal: Soft. He exhibits no distension. There is no tenderness. There is no rebound and no guarding.  Morbidly obese  Musculoskeletal: Normal  range of motion. He exhibits no edema and no tenderness.  Lymphadenopathy:    He has no cervical adenopathy.  Neurological: He is alert and oriented to person, place, and time. He exhibits normal muscle tone.  Skin: Skin is warm and dry. No rash noted. He is not diaphoretic. No erythema. No pallor.  Thick woody b/l LE skin  Psychiatric: He has a normal mood and affect. His behavior is normal. Judgment and  thought content normal.    Data Reviewed My office notes' cxr - negative ugi - no hernia - mild reflux abd u/s - fatty liver cscopy report & path - polyp - tubular adenoma Cardiac clearance from dr Mendel Ryder Echo results - EF 45% Evaluation labs - nml lipids, A1C 8.1, CMET wnl except for Cr 1.91 (stable), CBC - hgb 11.7, hct 36.6 (stable)  Assessment    Morbid obesity BMI 56 OSA on CPAP IDDM NICM/CHF GERD - no meds H/o HPL CKD H/o non-hodgkin lymphoma     Plan    I congratulated him on his weight loss. We discussed the importance of ongoing compliance with the preoperative diet. I explained while it is important with respect to decreasing the size of his liver to make surgery safer and easier. We discussed the preoperative testing and discuss his results. We also reviewed the preoperative plan as well as interoperative steps during surgery. He was re\re shown diagrams. We also discussed the typical postoperative course. I explained that I would probably place him in the step down unit after surgery because of his multiple medical comorbidities. I also explained that he may end up staying in the hospital a day or 2 longer just to monitor him primarily because of his chronic kidney disease and history of heart disease. We also talked about the typical postoperative course. I explained that he would have to follow with Dr. Valentina Lucks a little more frequently than typically because of the rapidly changing insulin requirements after surgery. All of his questions were asked and answered. He  was given his preoperative bowel prep instructions. He was also given a code to access the gastric bypass Emmi educational video. He was instructed to contact the office if he has any additional questions or concerns prior to surgery.  Mary Sella. Andrey Campanile, MD, FACS General, Bariatric, & Minimally Invasive Surgery [facility], [place]        [facility] M 02/22/2013, 9:23 AM

## 2013-03-05 NOTE — Interval H&P Note (Signed)
History and Physical Interval Note:  03/05/2013 10:31 AM  [name] Sosa  has presented today for surgery, with the diagnosis of morbid obesity   The various methods of treatment have been discussed with the patient and family. After consideration of risks, benefits and other options for treatment, the patient has consented to  Procedure(s): LAPAROSCOPIC ROUX-EN-Y GASTRIC BYPASS WITH UPPER ENDOSCOPY (N/A) as a surgical intervention .  The patient's history has been reviewed, patient examined, no change in status, stable for surgery.  I have reviewed the patient's chart and labs.  Questions were answered to the patient's satisfaction.    Has lost a total of 22 pounds during preop diet!  Mary Sella. Andrey Campanile, MD, FACS General, Bariatric, & Minimally Invasive Surgery [facility] Surgery, [place]   [facility]

## 2013-03-05 NOTE — Preoperative (Signed)
Beta Blockers   Reason not to administer Beta Blockers:Coreg taken 03-05-13

## 2013-03-05 NOTE — Transfer of Care (Signed)
Immediate Anesthesia Transfer of Care Note  Patient: [name] Sosa  Procedure(s) Performed: Procedure(s): LAPAROSCOPIC ROUX-EN-Y GASTRIC BYPASS WITH UPPER ENDOSCOPY (N/A)  Patient Location: PACU  Anesthesia Type:General  Level of Consciousness: sedated  Airway & Oxygen Therapy: Patient Spontanous Breathing and Patient connected to face mask oxygen  Post-op Assessment: Report given to PACU RN and Post -op Vital signs reviewed and stable  Post vital signs: Reviewed and stable  Complications: No apparent anesthesia complications

## 2013-03-05 NOTE — Op Note (Signed)
Name:  LAQUENTIN LOUDERMILK MRN: [id number] Date of Surgery: 03/05/2013  Preop Diagnosis:  Morbid Obesity, S/P RYGB  Postop Diagnosis:  Morbid Obesity, S/P RYGB (Wt - 431, BMI - 56.88)  Procedure:  Upper endoscopy  (Intraoperative)  Surgeon:  Ovidio Kin, M.D.  Anesthesia:  GET  Indications for procedure: [name] Sosa is a 52 y.o. male whose primary care physician is Lillia Mountain, MD and has completed a Roux-en-Y gastric bypass today by Dr. Andrey Campanile. The patient is in OR room #1 at [facility] OR.   I am doing an intraoperative upper endoscopy to evaluate the gastric pouch and the gastro-jejunal anastomosis.  Operative Note: The patient is under general anesthesia.  Dr. Andrey Campanile is laparoscoping the patient while I do an upper endoscopy to evaluate the stomach pouch and gastrojejunal anastomosis.  With the patient intubated, Dr. Daphine Deutscher passed the Pentax endoscope without difficulty down the esophagus to pass it through the gastrojejunostomy before Dr. Andrey Campanile completed  sewing the anterior layer of the gastrojejunal anastomosis.  I then endoscoped the patient after the completion of the anastomosis.  The esophago-gastric junction was at 42 cm.  The gastro-jejunal anastomosis was at 48 cm.  The GJ was eccentric along the lesser curvature of the new stomach pouch.    The mucosa of the stomach looked viable and the staple line was intact without bleeding.  The gastro-jejunal anastomosis looked okay.  While I insufflated the stomach pouch with air, Dr. Andrey Campanile clamped off the efferent limb of the jejunum.  He then flooded the upper abdomen with saline to put the gastric pouch and gastro-jejunal anastomosis under saline.  There was no bubbling or evidence of a leak.  Photos were taken of the anastomosis.  The scope was then withdrawn.  The esophagus was unremarkable and the patient tolerated the endoscopy without difficulty.  Ovidio Kin, MD, [facility] Pager: [phone] Office  phone:  [phone]

## 2013-03-05 NOTE — Op Note (Signed)
[name] Sosa [id number] [birth year]. 03/05/2013  Preoperative diagnosis:  Morbid obesity BMI 55  OSA on CPAP  IDDM  NICM/CHF  GERD - no meds  H/o HPL  CKD  H/o non-hodgkin lymphoma    Postoperative  diagnosis:  1. same  Surgical procedure: Laparoscopic Roux-en-Y gastric bypass (ante-colic, ante-gastric); upper endoscopy  Surgeon: [name] Sosa, M.D. FACS  Asst.: [name] Kin, MD FACS; [name] Murphy, MD FACS  Anesthesia: General plus 0.25% marcaine with epi  Complications: None   EBL: Minimal   Drains: None   Disposition: PACU in good condition   Indications for procedure: 52yo [name] with morbid obesity who has been unsuccessful at sustained weight loss. The patient's comorbidities are listed above. We discussed the risk and benefits of surgery including but not limited to anesthesia risk, bleeding, infection, blood clot formation, anastomotic leak, anastomotic stricture, ulcer formation, death, respiratory complications, intestinal blockage, internal hernia, gallstone formation, vitamin and nutritional deficiencies, injury to surrounding structures, failure to lose weight and mood changes.   Description of procedure: Patient is brought to the operating room and general anesthesia induced. The patient had received preoperative broad-spectrum IV antibiotics and subcutaneous heparin. The abdomen was widely sterilely prepped with Chloraprep and draped. Patient timeout was performed and correct patient and procedure confirmed. Access was obtained with a 12 mm Optiview trocar in the left upper quadrant and pneumoperitoneum established without difficulty. Under direct vision 12 mm trocars were placed laterally in the right upper quadrant, right upper quadrant midclavicular line, and to the left and above the umbilicus for the camera port. A 5 mm trocar was placed laterally in the left upper quadrant. The omentum was brought into the upper abdomen after several midline omental adhesions  were lysed with scissors and harmonic scalpel and the transverse mesocolon elevated and the ligament of Treitz clearly identified. A 40 cm biliopancreatic limb was then carefully measured from the ligament of Treitz. The small intestine was divided at this point with a single firing of the white load linear stapler. A Penrose drain was sutured to the end of the Roux-en-Y limb for later identification. A 100 cm Roux-en-Y limb was then carefully measured. At this point a side-to-side anastomosis was created between the Roux limb and the end of the biliopancreatic limb. This was accomplished with a single firing of the 45 mm white load linear stapler. The common enterotomy was closed with a running 2-0 Vicryl begun at either end of the enterotomy and tied centrally. Tisseel tissue sealant was placed over the anastomosis. The mesenteric defect was then closed with running 2-0 silk using the Endo360. The omentum was then divided with the harmonic scalpel up towards the transverse colon to allow mobility of the Roux limb toward the gastric pouch. The patient was then placed in steep reversed Trendelenburg. Through a 5 mm subxiphoid site the [name] Sosa retractor was placed and the left lobe of the liver elevated with excellent exposure of the upper stomach and hiatus. The angle of Hiss was then mobilized with the harmonic scalpel. A 5 cm gastric pouch was then carefully measured along the lesser curve of the stomach. Dissection was carried along the lesser curve at this point with the Harmonic scalpel working carefully back toward the lesser sac at right angles to the lesser curve. The free lesser sac was then entered. After being sure all tubes were removed from the stomach an initial firing of the gold load 60 mm linear stapler was fired at right angles across the lesser curve  for about 4 cm. The gastric pouch was further mobilized posteriorly and then the pouch was completed with 4 further firings of the 60 mm blue load  linear stapler and 1 final firing of a blue load 45mm linear stapler up through the previously dissected angle of His. It was ensured that the pouch was completely mobilized away from the gastric remnant. This created a nice tubular 4-5 cm gastric pouch.  The Roux limb was then brought up in an antecolic fashion with the candycane facing to the patient's left without undue tension. The gastrojejunostomy was created with an initial posterior row of 2-0 Vicryl between the Roux limb and the staple line of the gastric pouch. Enterotomies were then made in the gastric pouch and the Roux limb with the harmonic scalpel and at approximately 2-2-1/2 cm anastomosis was created with a single firing of the 45mm blue load linear stapler. The staple line was inspected and was intact without bleeding. The common enterotomy was then closed with running 2-0 Vicryl begun at either end and tied centrally. The anastomosis was off to the side of the gastric pouch toward the lesser curvature. The Ewall tube was bowing in the pouch making it difficult to pass so Dr [name] Sosa was available and was able to pass the endoscope  easily through the anastomosis and an outer anterior layer of running 2-0 Vicryl was placed using the Endo360.  With the outlet of the gastrojejunostomy clamped and under saline irrigation Dr [name] Sosa performed upper endoscopy and with the gastric pouch tensely distended with air-there was no evidence of leak on this test. The pouch was desufflated. The [name] Sosa defect was closed with running 2-0 silk. The abdomen was inspected for any evidence of bleeding or bowel injury and everything looked fine. The [name] Sosa retractor was removed under direct vision after coating the anastomosis with Tisseel tissue sealant. All CO2 was evacuated and trochars removed. Skin incisions were closed with 4-0 monocryl in a subcuticular fashion followed by Dermabond. Sponge needle and instrument counts were correct. The patient was taken to  the PACU in good condition.    [name] Sosa. Andrey Campanile, MD, FACS General, Bariatric, & Minimally Invasive Surgery [facility] Surgery, [place]

## 2013-03-05 NOTE — Anesthesia Postprocedure Evaluation (Signed)
  Anesthesia Post-op Note  Patient: [name] Sosa  Procedure(s) Performed: Procedure(s) (LRB): LAPAROSCOPIC ROUX-EN-Y GASTRIC BYPASS WITH UPPER ENDOSCOPY (N/A)  Patient Location: PACU  Anesthesia Type: General  Level of Consciousness: awake and alert   Airway and Oxygen Therapy: Patient Spontanous Breathing  Post-op Pain: mild  Post-op Assessment: Post-op Vital signs reviewed, Patient's Cardiovascular Status Stable, Respiratory Function Stable, Patent Airway and No signs of Nausea or vomiting  Last Vitals:  Filed Vitals:   03/05/13 1545  BP: 124/58  Pulse: 95  Temp: 36.9 C  Resp: 29    Post-op Vital Signs: stable   Complications: No apparent anesthesia complications

## 2013-03-06 ENCOUNTER — Inpatient Hospital Stay (HOSPITAL_COMMUNITY): Payer: 59

## 2013-03-06 ENCOUNTER — Encounter (HOSPITAL_COMMUNITY): Payer: Self-pay | Admitting: General Surgery

## 2013-03-06 LAB — POTASSIUM: Potassium: 5.2 mEq/L — ABNORMAL HIGH (ref 3.5–5.1)

## 2013-03-06 LAB — COMPREHENSIVE METABOLIC PANEL
AST: 133 U/L — ABNORMAL HIGH (ref 0–37)
Alkaline Phosphatase: 65 U/L (ref 39–117)
CO2: 22 mEq/L (ref 19–32)
Calcium: 8.7 mg/dL (ref 8.4–10.5)
Creatinine, Ser: 2.22 mg/dL — ABNORMAL HIGH (ref 0.50–1.35)
GFR calc Af Amer: 37 mL/min — ABNORMAL LOW (ref >90)
GFR calc non Af Amer: 32 mL/min — ABNORMAL LOW (ref >90)
Glucose, Bld: 161 mg/dL — ABNORMAL HIGH (ref 70–99)
Sodium: 134 mEq/L — ABNORMAL LOW (ref 135–145)
Total Bilirubin: 0.7 mg/dL (ref 0.3–1.2)

## 2013-03-06 LAB — CBC WITH DIFFERENTIAL/PLATELET
Basophils Absolute: 0 10*3/uL (ref 0.0–0.1)
Eosinophils Relative: 0 % (ref 0–5)
HCT: 34.8 % — ABNORMAL LOW (ref 39.0–52.0)
Lymphocytes Relative: 15 % (ref 12–46)
MCH: 24.5 pg — ABNORMAL LOW (ref 26.0–34.0)
MCHC: 30.5 g/dL (ref 30.0–36.0)
MCV: 80.6 fL (ref 78.0–100.0)
Monocytes Absolute: 1.1 10*3/uL — ABNORMAL HIGH (ref 0.1–1.0)
Neutrophils Relative %: 76 % (ref 43–77)
Platelets: 268 10*3/uL (ref 150–400)
RDW: 16.8 % — ABNORMAL HIGH (ref 11.5–15.5)
WBC: 11.3 10*3/uL — ABNORMAL HIGH (ref 4.0–10.5)

## 2013-03-06 LAB — GLUCOSE, CAPILLARY: Glucose-Capillary: 174 mg/dL — ABNORMAL HIGH (ref 70–99)

## 2013-03-06 LAB — HEMOGLOBIN AND HEMATOCRIT, BLOOD
HCT: 34.9 % — ABNORMAL LOW (ref 39.0–52.0)
Hemoglobin: 10.8 g/dL — ABNORMAL LOW (ref 13.0–17.0)

## 2013-03-06 MED ORDER — IOHEXOL 300 MG/ML  SOLN
50.0000 mL | Freq: Once | INTRAMUSCULAR | Status: AC | PRN
  Administered 2013-03-06: 30 mL via ORAL

## 2013-03-06 MED ORDER — OXYCODONE HCL 5 MG/5ML PO SOLN: 5.0000 mg | ORAL | Status: DC | PRN

## 2013-03-06 MED ORDER — SODIUM CHLORIDE 0.9 % IN NEBU
INHALATION_SOLUTION | RESPIRATORY_TRACT | Status: AC
  Filled 2013-03-06: qty 3

## 2013-03-06 MED ORDER — SODIUM CHLORIDE 0.45 % IV SOLN
INTRAVENOUS | Status: DC
  Administered 2013-03-06 – 2013-03-07 (×3): via INTRAVENOUS

## 2013-03-06 NOTE — Progress Notes (Signed)
RT placed patient on CPAP with home settings of auto titrate 4.0 to 20.0 cmH2O. Patient says that he will get someone to bring his machine from home. Patient tolerating well at this time.

## 2013-03-06 NOTE — Progress Notes (Signed)
CARE MANAGEMENT NOTE 03/06/2013  Patient:  ISAMU, TRAMMEL   Account Number:  [id number]  Date Initiated:  03/06/2013  Documentation initiated by:  DAVIS,RHONDA  Subjective/Objective Assessment:   pt with gastric bypass/hypoxic events s/p surg required bipap overnight     Action/Plan:   home when stable   Anticipated DC Date:  03/09/2013   Anticipated DC Plan:  HOME/SELF CARE  In-house referral  NA      DC Planning Services  NA      [facility] Choice  NA   Choice offered to / List presented to:  NA   DME arranged  NA      DME agency  NA     HH arranged  NA      HH agency  NA   Status of service:  In process, will continue to follow Medicare Important Message given?  NA - LOS <3 / Initial given by admissions (If response is "NO", the following Medicare IM given date fields will be blank) Date Medicare IM given:   Date Additional Medicare IM given:    Discharge Disposition:    Per UR Regulation:  Reviewed for med. necessity/level of care/duration of stay  If discussed at Long Length of Stay Meetings, dates discussed:    Comments:  [id number]/Rhonda Stark Jock, BSN, [place] [phone] Chart Reviewed for discharge and hospital needs. Discharge needs at time of review:  None present will follow for needs. Review of patient progress due on [id number].

## 2013-03-06 NOTE — Progress Notes (Signed)
1 Day Post-Op  Subjective: Doing well. Just sore. Pain ok. No reflux. Tolerated CPAP. Some hyperkalemia this am. Dr Gerrit Friends changed IVF  Objective: Vital signs in last 24 hours: Temp:  [97.4 F (36.3 C)-98.9 F (37.2 C)] 97.4 F (36.3 C) (12/16 0800) Pulse Rate:  [71-97] 74 (12/16 0800) Resp:  [13-34] 20 (12/16 0800) BP: (112-145)/(41-72) 133/66 mmHg (12/16 0800) SpO2:  [86 %-100 %] 100 % (12/16 0800)    Intake/Output from previous day: 12/15 0701 - 12/16 0700 In: 4033.3 [I.V.:3733.3; IV Piggyback:300] Out: 1125 [Urine:1100; Blood:25] Intake/Output this shift: Total I/O In: -  Out: 320 [Urine:320]  Sitting in chair; alert, nad cta b/l Reg Obese, soft, expected mild TTP. Incisions c/d/i No edema  Lab Results:   Recent Labs  03/05/13 1527 03/06/13 0359  WBC  --  11.3*  HGB 10.9* 10.6*  HCT 34.8* 34.8*  PLT  --  268   BMET  Recent Labs  03/06/13 0359 03/06/13 0701  NA 134*  --   K 5.5* 5.2*  CL 103  --   CO2 22  --   GLUCOSE 161*  --   BUN 21  --   CREATININE 2.22*  --   CALCIUM 8.7  --    PT/INR No results found for this basename: LABPROT, INR,  in the last 72 hours ABG No results found for this basename: PHART, PCO2, PO2, HCO3,  in the last 72 hours  Studies/Results: Dg Ugi W/water Sol Cm  03/06/2013   CLINICAL DATA:  Status post gastric bypass surgery  EXAM: WATER SOLUBLE UPPER GI SERIES  TECHNIQUE: Single-column upper GI series was performed using water soluble contrast.  CONTRAST:  30mL OMNIPAQUE IOHEXOL 300 MG/ML  SOLN  COMPARISON:  12/21/2012  FLUOROSCOPY TIME:  1 min 21 seconds  FINDINGS: Scout radiograph shows gaseous distension of the small and large bowel loops consistent with postoperative ileus. The patient ingested approximately 30 cc of water-soluble contrast material. This promptly passed through the esophagus and into the gastric pouch. No extravasation of contrast material identified. On subsequent images contrast is seen flowing from the  gastric remnant into the jejunum.  IMPRESSION: 1. No evidence for contrast extravasation status post gastric bypass 2. Patent gastrojejunostomy. These results will be called to the ordering clinician or representative by the Radiologist Assistant, and communication documented in the PACS Dashboard.   Electronically Signed   By: Signa Kell M.D.   On: 03/06/2013 09:55    Anti-infectives: Anti-infectives   Start     Dose/Rate Route Frequency Ordered Stop   03/05/13 0843  cefOXitin (MEFOXIN) 2 g in dextrose 5 % 50 mL IVPB  Status:  Discontinued     2 g 100 mL/hr over 30 Minutes Intravenous On call to O.R. 03/05/13 0843 03/05/13 1646      Assessment/Plan: s/p Procedure(s): LAPAROSCOPIC ROUX-EN-Y GASTRIC BYPASS WITH UPPER ENDOSCOPY (N/A)  No fever. ugi ok. No evidence of leak of contrast. Will start POD 1 diet OSA - cont cpap pulm toilet, IS, ambulate VTE prophylaxis - cont lovenox, scds CKD- good uop. Cr up slightly. D/c foley. Repeat bmet in am and follow trend tx to floor  Mary Sella. Andrey Campanile, MD, FACS General, Bariatric, & Minimally Invasive Surgery [facility] Surgery, [place]   LOS: 1 day    [name] 03/06/2013

## 2013-03-07 LAB — CBC WITH DIFFERENTIAL/PLATELET
Eosinophils Absolute: 0.1 10*3/uL (ref 0.0–0.7)
Eosinophils Relative: 1 % (ref 0–5)
HCT: 33.2 % — ABNORMAL LOW (ref 39.0–52.0)
Hemoglobin: 10.4 g/dL — ABNORMAL LOW (ref 13.0–17.0)
Lymphocytes Relative: 18 % (ref 12–46)
Lymphs Abs: 1.6 10*3/uL (ref 0.7–4.0)
MCH: 24.7 pg — ABNORMAL LOW (ref 26.0–34.0)
MCV: 78.9 fL (ref 78.0–100.0)
Monocytes Absolute: 0.9 10*3/uL (ref 0.1–1.0)
Monocytes Relative: 10 % (ref 3–12)
Platelets: 233 10*3/uL (ref 150–400)
RBC: 4.21 MIL/uL — ABNORMAL LOW (ref 4.22–5.81)
WBC: 9 10*3/uL (ref 4.0–10.5)

## 2013-03-07 LAB — GLUCOSE, CAPILLARY: Glucose-Capillary: 114 mg/dL — ABNORMAL HIGH (ref 70–99)

## 2013-03-07 LAB — BASIC METABOLIC PANEL
BUN: 17 mg/dL (ref 6–23)
CO2: 22 mEq/L (ref 19–32)
Calcium: 9.1 mg/dL (ref 8.4–10.5)
GFR calc Af Amer: 42 mL/min — ABNORMAL LOW (ref >90)
GFR calc non Af Amer: 36 mL/min — ABNORMAL LOW (ref >90)
Glucose, Bld: 121 mg/dL — ABNORMAL HIGH (ref 70–99)
Sodium: 134 mEq/L — ABNORMAL LOW (ref 135–145)

## 2013-03-07 MED ORDER — INSULIN ASPART 100 UNIT/ML ~~LOC~~ SOLN: 0.0000 [IU] | Freq: Three times a day (TID) | SUBCUTANEOUS | Status: DC

## 2013-03-07 MED ORDER — OXYCODONE HCL 5 MG/5ML PO SOLN: 5.0000 mg | ORAL | Status: DC | PRN

## 2013-03-07 MED ORDER — RAMIPRIL 10 MG PO CAPS: 10.0000 mg | ORAL_CAPSULE | Freq: Every morning | ORAL | Status: DC

## 2013-03-07 MED ORDER — PANTOPRAZOLE SODIUM 40 MG PO TBEC: 40.0000 mg | DELAYED_RELEASE_TABLET | Freq: Every day | ORAL | Status: DC

## 2013-03-07 NOTE — Progress Notes (Signed)
Patient alert and oriented, pain is controlled. Patient is tolerating fluids, plan to advance to protein shake today. Reviewed Gastric Bypass discharge instructions with patient and patient is able to articulate understanding. Provided information on BELT program, Support Group and [facility] outpatient pharmacy. All questions answered, will continue to monitor.  

## 2013-03-07 NOTE — Progress Notes (Signed)
Patient is refusing CPAP at this time. RN aware.

## 2013-03-07 NOTE — Progress Notes (Signed)
2 Days Post-Op  Subjective: Doing great. Tolerated water. Walking multiple laps already this am. No n/v/regurg.   Objective: Vital signs in last 24 hours: Temp:  [97.2 F (36.2 C)-97.8 F (36.6 C)] 97.4 F (36.3 C) (12/17 0800) Pulse Rate:  [68-96] 96 (12/17 0743) Resp:  [20-22] 22 (12/16 2318) BP: (122-146)/(59-74) 130/71 mmHg (12/17 0743) SpO2:  [94 %-97 %] 97 % (12/16 2318)    Intake/Output from previous day: 12/16 0701 - 12/17 0700 In: 2700 [P.O.:300; I.V.:2400] Out: 2120 [Urine:2120] Intake/Output this shift:    Alert, nad; sitting in chair cta Reg Soft, obese, min TTP. Incision c/d/i No edema/calf ttp  Lab Results:   Recent Labs  03/06/13 0359 03/06/13 1601 03/07/13 0333  WBC 11.3*  --  9.0  HGB 10.6* 10.8* 10.4*  HCT 34.8* 34.9* 33.2*  PLT 268  --  233   BMET  Recent Labs  03/06/13 0359 03/06/13 0701 03/07/13 0333  NA 134*  --  134*  K 5.5* 5.2* 4.4  CL 103  --  102  CO2 22  --  22  GLUCOSE 161*  --  121*  BUN 21  --  17  CREATININE 2.22*  --  1.99*  CALCIUM 8.7  --  9.1   PT/INR No results found for this basename: LABPROT, INR,  in the last 72 hours ABG No results found for this basename: PHART, PCO2, PO2, HCO3,  in the last 72 hours  Studies/Results: Dg Ugi W/water Sol Cm  03/06/2013   CLINICAL DATA:  Status post gastric bypass surgery  EXAM: WATER SOLUBLE UPPER GI SERIES  TECHNIQUE: Single-column upper GI series was performed using water soluble contrast.  CONTRAST:  30mL OMNIPAQUE IOHEXOL 300 MG/ML  SOLN  COMPARISON:  12/21/2012  FLUOROSCOPY TIME:  1 min 21 seconds  FINDINGS: Scout radiograph shows gaseous distension of the small and large bowel loops consistent with postoperative ileus. The patient ingested approximately 30 cc of water-soluble contrast material. This promptly passed through the esophagus and into the gastric pouch. No extravasation of contrast material identified. On subsequent images contrast is seen flowing from the  gastric remnant into the jejunum.  IMPRESSION: 1. No evidence for contrast extravasation status post gastric bypass 2. Patent gastrojejunostomy. These results will be called to the ordering clinician or representative by the Radiologist Assistant, and communication documented in the PACS Dashboard.   Electronically Signed   By: Signa Kell M.D.   On: 03/06/2013 09:55    Anti-infectives: Anti-infectives   Start     Dose/Rate Route Frequency Ordered Stop   03/05/13 0843  cefOXitin (MEFOXIN) 2 g in dextrose 5 % 50 mL IVPB  Status:  Discontinued     2 g 100 mL/hr over 30 Minutes Intravenous On call to O.R. 03/05/13 0843 03/05/13 1646      Assessment/Plan: s/p Procedure(s): LAPAROSCOPIC ROUX-EN-Y GASTRIC BYPASS WITH UPPER ENDOSCOPY (N/A)  Doing great. No fever. Will adv to POD 2 diet OSA - cont cpap  pulm toilet, IS, ambulate  VTE prophylaxis - cont lovenox, scds  CKD- good uop. Cr now back to baseline IDDM - blood sugars <200; requiring minimal SSI  Looks really good. If tolerates shakes will d/c later today Discussed d/c instructions with pt and need to remain mobile for next several weeks to decrease chance of dvt Discussed importance of liquid diet Discussed change in insulin requirement and need to do sliding scale and keep log and followup with Dr Valentina Lucks regarding DM  Mary Sella. Andrey Campanile, MD,  FACS General, Bariatric, & Minimally Invasive Surgery [facility] [place]     LOS: 2 days    [name] 03/07/2013

## 2013-03-08 NOTE — Discharge Summary (Signed)
Physician Discharge Summary  [name] Sosa JXB:[id number] DOB: March 14, 1961 DOA: 03/05/2013  PCP: Lillia Mountain, MD  Admit date: 03/05/2013 Discharge date: 03/07/2013  Recommendations for Outpatient Follow-up:  Lillia Mountain, MD 1-2 weeks   Follow-up Information   Follow up with [name], MD On 03/23/2013. (9:45 AM)    Specialty:  General Surgery   Contact information:   [address] [place] [address] [phone]      Discharge Diagnoses:  Active Problems:   IDDM (insulin dependent diabetes mellitus)   OSA (obstructive sleep apnea)   CKD (chronic kidney disease), stage III   Morbid obesity with BMI of 50.0-59.9, adult   Surgical Procedure: Laparoscopic Roux-en-Y gastric bypass, upper endoscopy  Discharge Condition: Good Disposition: Home  Diet recommendation: Postoperative gastric bypass diet  Filed Weights   03/05/13 0830  Weight: 413 lb 6 oz (187.506 kg)     Hospital Course:  The patient was admitted for a planned laparoscopic Roux-en-Y gastric bypass. Please see operative note. Preoperatively the patient was given 5000 units of subcutaneous heparin for DVT prophylaxis. Postoperative prophylactic Lovenox dosing was started on the morning of postoperative day 1. The patient underwent an upper GI on postoperative day 1 which demonstrated no extravasation of contrast and emptying of the contrast into the Roux limb. The patient was started on ice chips and water which they tolerated. On postoperative day 2 The patient's diet was advanced to protein shakes which they also tolerated. The patient was ambulating without difficulty. Their vital signs are stable without fever or tachycardia. Their hemoglobin had remained stable. The patient was maintained on their home settings for CPAP therapy. The patient had received discharge instructions and counseling. They were deemed stable for discharge.   Discharge Instructions  Discharge Orders    Future Appointments Provider Department Dept Phone   03/19/2013 3:30 PM [facility] Post-Op Class [facility] [phone]   03/23/2013 9:45 AM [name], MD [facility], [place] [phone]   Future Orders Complete By Expires   Discharge patient  As directed    Comments:     After discharge teaching       Medication List    STOP taking these medications       aspirin EC 81 MG tablet     insulin glargine 100 UNIT/ML injection  Commonly known as:  LANTUS     insulin lispro 100 UNIT/ML injection  Commonly known as:  HUMALOG      TAKE these medications       acetaminophen 500 MG tablet  Commonly known as:  TYLENOL  Take 1,000 mg by mouth every 6 (six) hours as needed. pain     ALPRAZolam 0.5 MG tablet  Commonly known as:  XANAX  Take 0.5 mg by mouth 2 (two) times daily as needed for anxiety.     carvedilol 25 MG tablet  Commonly known as:  COREG  Take 25 mg by mouth 2 (two) times daily with a meal.     DYMISTA 137-50 MCG/ACT Susp  Generic drug:  Azelastine-Fluticasone  Place 1 spray into the nose 2 (two) times daily.     ferrous sulfate 325 (65 FE) MG tablet  Take 325 mg by mouth 2 (two) times daily.     insulin aspart 100 UNIT/ML injection  Commonly known as:  novoLOG  For blood sugars: 0 to 120           give 0 units 121 to 150  Give 3 units 151 to 200       Give 4 units 201 to 250       Give 7 units 251 to 300       Give  11 units 301 to 350       Give 15 units 351 to 400       Give 20 units >400                Call Doctor         Inject 0-20 Units into the skin 3 (three) times daily with meals.       levocetirizine 5 MG tablet  Commonly known as:  XYZAL  Take 5 mg by mouth every evening.     montelukast 10 MG tablet  Commonly known as:  SINGULAIR  Take 10 mg by mouth at bedtime.     multivitamin with minerals Tabs tablet  Take 1 tablet by mouth 2 (two) times daily.     Omega 3 1200 MG Caps   Take 1 capsule by mouth 2 (two) times daily.     oxyCODONE 5 MG/5ML solution  Commonly known as:  ROXICODONE  Take 5-10 mLs (5-10 mg total) by mouth every 4 (four) hours as needed for severe pain.     pantoprazole 40 MG tablet  Commonly known as:  PROTONIX  Take 1 tablet (40 mg total) by mouth daily.     ramipril 10 MG capsule  Commonly known as:  ALTACE  Take 1 capsule (10 mg total) by mouth every morning. Wait to resume this until after appt with PCP     zolpidem 10 MG tablet  Commonly known as:  AMBIEN  Take 10 mg by mouth at bedtime as needed for sleep.           Follow-up Information   Follow up with [name], MD On 03/23/2013. (9:45 AM)    Specialty:  General Surgery   Contact information:   [address] [place] [address] [phone]        The results of significant diagnostics from this hospitalization (including imaging, microbiology, ancillary and laboratory) are listed below for reference.    Significant Diagnostic Studies: Dg Ugi W/water Sol Cm  03/06/2013   CLINICAL DATA:  Status post gastric bypass surgery  EXAM: WATER SOLUBLE UPPER GI SERIES  TECHNIQUE: Single-column upper GI series was performed using water soluble contrast.  CONTRAST:  30mL OMNIPAQUE IOHEXOL 300 MG/ML  SOLN  COMPARISON:  12/21/2012  FLUOROSCOPY TIME:  1 min 21 seconds  FINDINGS: Scout radiograph shows gaseous distension of the small and large bowel loops consistent with postoperative ileus. The patient ingested approximately 30 cc of water-soluble contrast material. This promptly passed through the esophagus and into the gastric pouch. No extravasation of contrast material identified. On subsequent images contrast is seen flowing from the gastric remnant into the jejunum.  IMPRESSION: 1. No evidence for contrast extravasation status post gastric bypass 2. Patent gastrojejunostomy. These results will be called to the ordering clinician or representative by the Radiologist  Assistant, and communication documented in the PACS Dashboard.   Electronically Signed   By: Signa Kell M.D.   On: 03/06/2013 09:55    Labs: Basic Metabolic Panel:  Recent Labs Lab 03/06/13 0359 03/06/13 0701 03/07/13 0333  NA 134*  --  134*  K 5.5* 5.2* 4.4  CL 103  --  102  CO2 22  --  22  GLUCOSE 161*  --  121*  BUN 21  --  17  CREATININE 2.22*  --  1.99*  CALCIUM 8.7  --  9.1   Liver Function Tests:  Recent Labs Lab 03/06/13 0359  AST 133*  ALT 100*  ALKPHOS 65  BILITOT 0.7  PROT 7.6  ALBUMIN 3.4*    CBC:  Recent Labs Lab 03/05/13 1527 03/06/13 0359 03/06/13 1601 03/07/13 0333  WBC  --  11.3*  --  9.0  NEUTROABS  --  8.5*  --  6.4  HGB 10.9* 10.6* 10.8* 10.4*  HCT 34.8* 34.8* 34.9* 33.2*  MCV  --  80.6  --  78.9  PLT  --  268  --  233    CBG:  Recent Labs Lab 03/06/13 1136 03/06/13 1628 03/06/13 2127 03/07/13 0738 03/07/13 1137  GLUCAP 124* 103* 102* 122* 114*    Active Problems:   IDDM (insulin dependent diabetes mellitus)   OSA (obstructive sleep apnea)   CKD (chronic kidney disease), stage III   Morbid obesity with BMI of 50.0-59.9, adult   Time coordinating discharge: 10 minutes  Signed:  [name], MD [facility], [place] [phone] 03/08/2013, 9:08 AM

## 2013-03-19 VITALS — Ht 73.0 in | Wt 391.0 lb

## 2013-03-19 NOTE — Progress Notes (Signed)
Bariatric Class:  Appt start time: 1530 end time:  1630.  2 Week Post-Operative Nutrition Class  Patient was seen on 03/19/13 for Post[facility]ative [facility]   Surgery date: 03/05/13 Surgery type: RYGB Starting weight at [facility]: 430 lbs on 12/26/12  Weight today: 391.0 Weight change: 39 lbs Total weight lost: 39 lbs  TANITA  BODY COMP RESULTS  03/19/13   BMI (kg/m^2) 51.6   Fat Mass (lbs) 136.5   Fat Free Mass (lbs) 254.5   Total Body Water (lbs) 186.5   The following the learning objectives were met by the patient during this course:  Identifies Phase 3A (Soft, High Proteins) Dietary Goals and will begin from 2 weeks post-operatively to 2 months post-operatively  Identifies appropriate sources of fluids and proteins   States protein recommendations and appropriate sources post-operatively  Identifies the need for appropriate texture modifications, mastication, and bite sizes when consuming solids  Identifies appropriate multivitamin and calcium sources post-operatively  Describes the need for physical activity post-operatively and will follow MD recommendations  States when to call healthcare provider regarding medication questions or post-operative complications  Handouts given during class include:  Phase 3A: Soft, High Protein Diet Handout  Band Fill Guidelines Handout  Follow-Up Plan: Patient will follow-up at [facility] in 6 weeks for 8 week post-op nutrition visit for diet advancement per MD.

## 2013-03-19 NOTE — Patient Instructions (Signed)
Patient to follow Phase 3A-Soft, High Protein Diet and follow-up at [facility] in 6 weeks for 2 months post-op nutrition visit for diet advancement. 

## 2013-03-23 ENCOUNTER — Ambulatory Visit (INDEPENDENT_AMBULATORY_CARE_PROVIDER_SITE_OTHER): Payer: 59 | Admitting: General Surgery

## 2013-03-23 ENCOUNTER — Encounter (INDEPENDENT_AMBULATORY_CARE_PROVIDER_SITE_OTHER): Payer: Self-pay | Admitting: General Surgery

## 2013-03-23 VITALS — BP 124/82 | HR 86 | Temp 97.0°F | Resp 18 | Ht 73.0 in | Wt 384.8 lb

## 2013-03-23 DIAGNOSIS — Z9884 Bariatric surgery status: Secondary | ICD-10-CM | POA: Insufficient documentation

## 2013-03-23 DIAGNOSIS — Z09 Encounter for follow-up examination after completed treatment for conditions other than malignant neoplasm: Secondary | ICD-10-CM

## 2013-03-23 HISTORY — DX: Bariatric surgery status: Z98.84

## 2013-03-23 LAB — COMPREHENSIVE METABOLIC PANEL
ALT: 25 U/L (ref 0–53)
AST: 25 U/L (ref 0–37)
Albumin: 3.8 g/dL (ref 3.5–5.2)
Alkaline Phosphatase: 64 U/L (ref 39–117)
BUN: 19 mg/dL (ref 6–23)
CALCIUM: 9.5 mg/dL (ref 8.4–10.5)
CO2: 26 mEq/L (ref 19–32)
Chloride: 101 mEq/L (ref 96–112)
Creat: 1.78 mg/dL — ABNORMAL HIGH (ref 0.50–1.35)
Glucose, Bld: 147 mg/dL — ABNORMAL HIGH (ref 70–99)
POTASSIUM: 4.1 meq/L (ref 3.5–5.3)
Sodium: 137 mEq/L (ref 135–145)
Total Bilirubin: 0.5 mg/dL (ref 0.3–1.2)
Total Protein: 6.8 g/dL (ref 6.0–8.3)

## 2013-03-23 NOTE — Patient Instructions (Signed)
Take protonix as directed Can resume aspirin Set weekly goals for increasing your daily step count  Eating techniques 20-20-20 (30-30-30) 20 chews, 20 seconds between bites of food, 20 minutes to eat; sometimes you may need 30 chews, 30 seconds etc Use your nondominant hand to eat with Your bite of food should be the size of a dime Put fork down between bites Use a child/infant size utensil Try not to eat while watching TV  Meats need to be moist and soft

## 2013-03-23 NOTE — Progress Notes (Addendum)
Subjective:     Patient ID: [name] Sosa, male   DOB: May 18, 1960, 53 y.o.   MRN: [id number]  HPI 53 year old morbidly obese African American male comes in for followup after undergoing laparoscopic Roux-en-Y gastric bypass on December 15. He was discharged from the hospital on December 17. He states that he has done well since discharge. He has already seen his primary care physician in followup chemistry and restarted on Lantus 50 units at night as well as sliding scale Humalog insulin. He states that his blood sugars were anywhere from 100-200 prior to seeing his primary care physician after surgery. Since restarting the Lantus his blood sugars have been in the low 100s. He states that his energy level is still low. He has been having some issues with hard stools. He is having bowel movements daily but they have been hard. He states that he believes he is drinking adequate amounts of water he is almost up to 64 ounces of water daily. Since starting solid foods he has had a few issues with regurgitation with certain foods. This has been frustrating because some days he has no problems with solids and other times he does have problems. He has found that fish seems to go down easier than chicken. He is taking his supplements. He is walking some. He is interested in joining the belt program. He denies any fevers or chills. He denies any melena or hematochezia. He denies any lightheadedness or dizziness. He is using his CPAP. He states he is not able to take the omega-3 capsules because they were too large  Review of Systems     Objective:   Physical Exam BP 124/82  Pulse 86  Temp(Src) 97 F (36.1 C) (Temporal)  Resp 18  Ht 6\' 1"  (1.854 m)  Wt 384 lb 12.8 oz (174.544 kg)  BMI 50.78 kg/m2  Gen: alert, NAD, non-toxic appearing Pupils: equal, no scleral icterus Pulm: Lungs clear to auscultation, symmetric chest rise CV: regular rate and rhythm Abd: soft, nontender, nondistended. Well-healed  trocar sites. No cellulitis. No incisional hernia Ext: no edema, no calf tenderness; Chronic lower extremity skin changes Skin: no rash, no jaundice     Assessment:     Status post laparoscopic Roux-en-Y gastric bypass Chronic kidney disease-stable Diabetes mellitus-improved Hypertension-improved History of hyperlipidemia     Plan:     Overall I think he is doing very well. I'm pleased with his progress. He has lost 46 pounds since his immediate preoperative appointment in the office. He did lose a little bit of weight during his preoperative diet phase. His weight at his initial visit to our clinic in September 2014 was 435 pounds. I will defer management of his insulin to his primary care physician. With respect to the hard stool I advised him to take MiraLAX as needed. I explained it is not uncommon for alterations to occur in bowel movements during the first 6-8 weeks after surgery. When he is 6 weeks out from surgery we will refer him to the belt program. We did talked about the importance of setting goals with respect to his activity level. Currently he states he is averaging about 3000 steps a day. I advised him to try to add 500 steps per week to his daily step count. He can restart his daily aspirin. We will check a comprehensive metabolic panel to make sure his creatinine is stable. Once we get those results I will contact his primary care physician to discuss restarting his ALT ACE.  I had held his Altace on discharge from the hospital Because he did experience a small bump in his creatinine postoperatively which did normalize on day of discharge. With respect to the intermittent regurgitation and vomiting I think a lot of it has to do with the eating techniques and behaviors. We rediscussed proper eating techniques and behaviors such as taking one small bite of food at a time, chewing 20-30 times, And waiting 20-30 seconds between bites of food. He was also given  Strategies to help follow  these rules such as putting the fork down between bites of food, eating with his nondominant hand, etc. He was given a return to work note for January 19. I explained that we can change this if needed   Followup 3 weeks  Leighton Ruff. Redmond Pulling, MD, FACS General, Bariatric, & Minimally Invasive Surgery [facility], [place]

## 2013-03-27 ENCOUNTER — Ambulatory Visit: Payer: 59

## 2013-04-12 ENCOUNTER — Encounter (INDEPENDENT_AMBULATORY_CARE_PROVIDER_SITE_OTHER): Payer: Self-pay | Admitting: General Surgery

## 2013-04-12 ENCOUNTER — Ambulatory Visit (INDEPENDENT_AMBULATORY_CARE_PROVIDER_SITE_OTHER): Payer: 59 | Admitting: General Surgery

## 2013-04-12 VITALS — BP 112/62 | HR 84 | Resp 18 | Ht 73.0 in | Wt 380.8 lb

## 2013-04-12 DIAGNOSIS — Z9884 Bariatric surgery status: Secondary | ICD-10-CM

## 2013-04-12 NOTE — Patient Instructions (Addendum)
Keep setting weekly goals with respect to your daily step count Consider taking colace for the hard stools  Eating techniques 20-20-20 (30-30-30) 20 chews, 20 seconds between bites of food, 20 minutes to eat; sometimes you may need 30 chews, 30 seconds etc Use your nondominant hand to eat with Put fork down between bites of food Use a child/infant size utensil Try not to eat while watching TV

## 2013-04-12 NOTE — Progress Notes (Signed)
Subjective:     Patient ID: [name] Sosa, male   DOB: May 09, 1960, 53 y.o.   MRN: [id number]  HPI 53 year old African [place] male status post laparoscopic Roux-en-Y gastric bypass in December 15th Comes in for followup. I last saw him on January 2. At that time he weighed 384 pounds. His preoperative weight was 431 pounds. His initial visit weight or office was 435 pounds. He states that he has been doing well since he was last seen. He states that he is no longer having any difficulty with the act of eating. He has some occasional heartburn but nothing serious. He denies any nausea or vomiting. He denies any regurgitation. He is having bowel movements daily but they are quite hard. He has been taking MiraLAX which has helped. He denies any abdominal pain. He is still taking about 55 units of Lantus a day along with sliding scale Humalog. His blood sugars have been less than 200 consistently. He is using a pedometer daily. When he initially started he was averaging around 2000-3000 steps a day Now he is up to about 6000 steps a day. He is also riding a stationary bike 20-30 minutes at a time several days a week. He has been interested in joining the belt program but has had a difficult time in getting information from them. He denies any fever, chills, left shoulder or back pain. He denies any melena or hematochezia  Review of Systems     Objective:   Physical Exam BP 112/62  Pulse 84  Resp 18  Ht 6\' 1"  (1.854 m)  Wt 380 lb 12.8 oz (172.73 kg)  BMI 50.25 kg/m2  Gen: alert, NAD, non-toxic appearing Pupils: equal, no scleral icterus Pulm: Lungs clear to auscultation, symmetric chest rise CV: regular rate and rhythm Abd: soft, nontender, nondistended. Well-healed trocar sites. No cellulitis. No incisional hernia Ext: no edema, no calf tenderness; hard woody LE skin Skin: no rash, no jaundice     Assessment:     Status post laparoscopic Roux-en-Y gastric bypass December  15 Hypertension-improved Insulin-dependent diabetes mellitus-improved Obstructive sleep apnea-stable History of nonischemic cardiomyopathy Dyslipidemia-stable Chronic kidney disease-stable Low back pain-stable     Plan:     Overall I think he is doing well. We rediscussed proper eating techniques and behaviors. I told him I would be in contact with the bariatric coordinator regarding the difficulty in getting feedback from the [place] program. I encouraged him to keep making weekly goals and increasing his daily step count. We discussed adding Colace to his bowel regimen. It appears he is getting in enough fluids. We discussed proper food choices and the importance of regular activity in order to optimize his weight loss. Followup 3 months  Leighton Ruff. Redmond Pulling, MD, FACS General, Bariatric, & Minimally Invasive Surgery [facility], [place]

## 2013-04-25 ENCOUNTER — Encounter: Payer: 59 | Attending: General Surgery | Admitting: Dietician

## 2013-04-25 VITALS — Ht 73.0 in | Wt 377.5 lb

## 2013-04-25 DIAGNOSIS — Z713 Dietary counseling and surveillance: Secondary | ICD-10-CM | POA: Insufficient documentation

## 2013-04-25 DIAGNOSIS — Z6841 Body Mass Index (BMI) 40.0 and over, adult: Secondary | ICD-10-CM

## 2013-04-25 NOTE — Patient Instructions (Addendum)
Goals:  Follow Phase 3B: High Protein + Non-Starchy Vegetables  Eat 3-6 small meals/snacks, every 3-5 hrs  Increase lean protein foods to meet 60-80g goal  Increase fluid intake to 64oz +  Avoid drinking 15 minutes before, during and 30 minutes after eating  Aim for >30 min of physical activity daily    03/19/13 04/25/2013   BMI (kg/m^2) 51.6 49.8   Fat Mass (lbs) 136.5 109   Fat Free Mass (lbs) 254.5 268.5   Total Body Water (lbs) 186.5 196.5

## 2013-04-25 NOTE — Progress Notes (Signed)
Follow-up visit:  8 Weeks Post-Operative RYGB Surgery  Medical Nutrition Therapy:  Appt start time: 0515 end time:  0600  Primary concerns today:   [name] Sosa is here today for his 8-week post-op RYGB appointment. He reports feeling well overall and is pleased that his insulin dosage has been reduced. He reports a few instances where foods have gotten "stuck" but is tolerating most foods overall. He states that he is craving fruits.  Surgery date: 03/05/13 Surgery type: RYGB Starting weight at [facility]: 430 lbs on 12/26/12  Weight today: 377.5 Weight change: 13.5 lbs Total weight lost: 52.5 lbs  TANITA  BODY COMP RESULTS  03/19/13 04/25/2013   BMI (kg/m^2) 51.6 49.8   Fat Mass (lbs) 136.5 109   Fat Free Mass (lbs) 254.5 268.5   Total Body Water (lbs) 186.5 196.5    Preferred Learning Style:  No preference indicated   Learning Readiness:  Ready   24-hr recall: Snk: Protein shake (25 g) B (AM): 1 egg and [place] yogurt (13 g) Snk (AM): none  L (PM): 2-3 oz Chicken or [place], sometimes with green beans (14-21 g) Snk (PM): Protein shake or protein bar (20-25 g)  D (PM): 2-3 oz Chicken or [place], sometimes with green beans (14-21 g) Snk (PM): none  Fluid intake: 40-64 oz Estimated total protein intake: 86-105 g  Medications: see list Supplementation: taking  CBG monitoring: yes, every day Average CBG per patient: 100-130 Last patient reported A1c: 7.8% 1 month ago  Using straws: no Drinking while eating: rarely Hair loss: no Carbonated beverages: no N/V/D/C: vomited 2-3 times; very constipated, having to take Miralax almost daily  Dumping syndrome: no  Recent physical activity:  Has joined BELT program, starts this Friday. Goes walking when it's nice.  Progress Towards Goal(s):  In progress.  Handouts given during visit include:  Phase III B protein+non-starchy vegetables   Nutritional Diagnosis:  [place]-3.3 Overweight/obesity related to past poor dietary habits and  physical inactivity as evidenced by patient w/ recent RYGB surgery following dietary guidelines for continued weight loss.    Intervention:  Nutrition education provided.  Teaching Method Utilized:  Visual Auditory  Barriers to learning/adherence to lifestyle change: food preferences  Demonstrated degree of understanding via:  Teach Back   Monitoring/Evaluation:  Dietary intake, exercise, lap band fills, and body weight. Follow up in 1 month for 3 month post-op visit.

## 2013-05-02 ENCOUNTER — Ambulatory Visit: Payer: 59 | Admitting: Dietician

## 2013-05-06 ENCOUNTER — Other Ambulatory Visit (INDEPENDENT_AMBULATORY_CARE_PROVIDER_SITE_OTHER): Payer: Self-pay | Admitting: General Surgery

## 2013-05-07 ENCOUNTER — Telehealth (INDEPENDENT_AMBULATORY_CARE_PROVIDER_SITE_OTHER): Payer: Self-pay | Admitting: *Deleted

## 2013-05-07 NOTE — Telephone Encounter (Signed)
Yes. Can give 2 refills

## 2013-05-07 NOTE — Telephone Encounter (Signed)
Prescription escribed to patients pharmacy at this time per Dr. Dois Davenport message.

## 2013-05-07 NOTE — Telephone Encounter (Signed)
Received a pharmacy request for a refill on patient's Protonix.  Is this appropriate?  Patient had RNY on 03/05/13.

## 2013-05-15 ENCOUNTER — Ambulatory Visit: Payer: 59 | Admitting: Interventional Cardiology

## 2013-05-23 ENCOUNTER — Encounter: Payer: 59 | Attending: General Surgery | Admitting: Dietician

## 2013-05-23 VITALS — Ht 73.0 in | Wt 362.0 lb

## 2013-05-23 DIAGNOSIS — Z6841 Body Mass Index (BMI) 40.0 and over, adult: Secondary | ICD-10-CM

## 2013-05-23 DIAGNOSIS — Z713 Dietary counseling and surveillance: Secondary | ICD-10-CM | POA: Insufficient documentation

## 2013-05-23 NOTE — Patient Instructions (Signed)
-  Make sure to get enough protein (80 g) per day   TANITA  BODY COMP RESULTS  03/19/13 04/25/2013 05/23/2013   BMI (kg/m^2) 51.6 49.8 47.8   Fat Mass (lbs) 136.5 109 113.5   Fat Free Mass (lbs) 254.5 268.5 248.5   Total Body Water (lbs) 186.5 196.5 182

## 2013-05-23 NOTE — Progress Notes (Signed)
Follow-up visit:  12 Weeks Post-Operative RYGB Surgery  Medical Nutrition Therapy:  Appt start time: 9702 end time:  0615  Primary concerns today:   Still craving fruits and wants to try other carbs.  Surgery date: 03/05/13 Surgery type: RYGB Starting weight at [facility]: 430 lbs on 12/26/12  Weight today: 362 lbs Weight change: 15.5 lbs Total weight lost: 68 lbs  TANITA  BODY COMP RESULTS  03/19/13 04/25/2013 05/23/2013   BMI (kg/m^2) 51.6 49.8 47.8   Fat Mass (lbs) 136.5 109 113.5   Fat Free Mass (lbs) 254.5 268.5 248.5   Total Body Water (lbs) 186.5 196.5 182    Preferred Learning Style:  No preference indicated   Learning Readiness:  Ready   24-hr recall: B: : Protein shake (25-30 g) Snk (AM): none  L (PM): soup with ground turkey/beef and vegetables OR salads with 3-5 oz chicken with cheese Snk (PM): about 10 pistachios, pecans or PB2  D (PM): soup OR salads with 3-5 oz chicken OR protein shake Snk (PM): none  Fluid intake: 70+ oz Estimated total protein intake: 60-80 g  Medications: see list Supplementation: taking  CBG monitoring: yes, every day Average CBG per patient: 99-130 fasting; 195-225 postprandial ("huge improvement" per patient) Last patient reported A1c: no new HgbA1c  Using straws: no Drinking while eating: no Hair loss: no Carbonated beverages: no N/V/D/C:  occasionally constipated, takes Miralax occasionally  Dumping syndrome: no  Recent physical activity: BELT program  Progress Towards Goal(s):  In progress.  Handouts given during visit include:  Phase III B protein+non-starchy vegetables   Nutritional Diagnosis:  [place]-3.3 Overweight/obesity related to past poor dietary habits and physical inactivity as evidenced by patient w/ recent RYGB surgery following dietary guidelines for continued weight loss.    Intervention:  Nutrition education provided.  Teaching Method Utilized:  Visual Auditory  Barriers to learning/adherence to  lifestyle change: food preferences  Demonstrated degree of understanding via:  Teach Back   Monitoring/Evaluation:  Dietary intake, exercise, and body weight. Follow up in 3 months for 6 month post-op visit.

## 2013-07-26 ENCOUNTER — Encounter (INDEPENDENT_AMBULATORY_CARE_PROVIDER_SITE_OTHER): Payer: Self-pay | Admitting: General Surgery

## 2013-07-26 ENCOUNTER — Ambulatory Visit (INDEPENDENT_AMBULATORY_CARE_PROVIDER_SITE_OTHER): Payer: 59 | Admitting: General Surgery

## 2013-07-26 VITALS — BP 128/80 | HR 74 | Temp 97.5°F | Resp 14 | Wt 347.2 lb

## 2013-07-26 DIAGNOSIS — Z9884 Bariatric surgery status: Secondary | ICD-10-CM

## 2013-07-26 NOTE — Progress Notes (Signed)
Subjective:     Patient ID: [name] Sosa, male   DOB: 09/15/1960, 53 y.o.   MRN: [id number]  HPI 53 year old African [place] male status post laparoscopic Roux-en-Y gastric bypass in December 15th Comes in for followup. I last saw him on 04/12/13. At that time he weighed 381 pounds. His preoperative weight was 431 pounds. His initial visit weight or office was 435 pounds. He states that he has been doing well since he was last seen. He states that he is no longer having any difficulty with the act of eating. He has some occasional heartburn but nothing serious. He denies any nausea or vomiting. He denies any regurgitation. He is having bowel movements daily. He has been taking MiraLAX which has helped. He denies any abdominal pain. He is now taking about 10-25 units of sliding scale Humalog. His blood sugars have been less than 200 consistently. He is using a pedometer daily. When he initially started he was averaging around 2000-3000 steps a day Now he is up to about 8000 -12000 steps a day. He is also riding a stationary bike 20-30 minutes at a time several days a week. He went to the belt program but ended up stop going but it wasn't challenging enough.  He is also lifting weights several times a week. He denies any fever, chills, left shoulder or back pain. He denies any melena or hematochezia. He is using his CPAP occasionally - he states he feels as rested when not using it.  He reports he is taking his supplements.  Dr. Lavone Orn has been managing his diabetes  PMHx, PSHx, SOCHx, FAMHx, ALL reviewed and unchanged  Review of Systems A 10 point Review of systems was performed and all systems are negative except for what is mentioned in the history of present illness     Objective:   Physical Exam BP 128/80  Pulse 74  Temp(Src) 97.5 F (36.4 C)  Resp 14  Wt 347 lb 3.2 oz (157.489 kg)  Gen: alert, NAD, non-toxic appearing Pupils: equal, no scleral icterus Pulm: Lungs clear to  auscultation, symmetric chest rise CV: regular rate and rhythm Abd: soft, nontender, nondistended. Well-healed trocar sites. No cellulitis. No incisional hernia Ext: no edema, no calf tenderness; hard woody LE skin Skin: no rash, no jaundice     Assessment:     Status post laparoscopic Roux-en-Y gastric bypass December 15 Hypertension-improved Insulin-dependent diabetes mellitus-improved Obstructive sleep apnea-improved History of nonischemic cardiomyopathy Dyslipidemia-stable Chronic kidney disease-stable Low back pain-stable     Plan:     Overall I think he is doing well. We rediscussed proper eating techniques and behaviors. I congratulated him on his weight loss. His weight loss to date has been around 84 pounds. I encouraged him to keep making weekly goals and increasing his daily step count. It appears he is getting in enough fluids. We discussed proper food choices and the importance of regular activity in order to optimize his weight loss. Followup 3 months. He was given lab slips to get his surveillance labs checked.  Leighton Ruff. Redmond Pulling, MD, FACS General, Bariatric, & Minimally Invasive Surgery [facility], [place]

## 2013-07-26 NOTE — Patient Instructions (Signed)
Please get labs drawn - need to be fasting Keep up the good work

## 2013-08-07 ENCOUNTER — Other Ambulatory Visit (INDEPENDENT_AMBULATORY_CARE_PROVIDER_SITE_OTHER): Payer: Self-pay | Admitting: General Surgery

## 2013-08-07 LAB — CBC WITH DIFFERENTIAL/PLATELET
BASOS ABS: 0 10*3/uL (ref 0.0–0.1)
Basophils Relative: 0 % (ref 0–1)
Eosinophils Absolute: 0.1 10*3/uL (ref 0.0–0.7)
Eosinophils Relative: 2 % (ref 0–5)
HCT: 35.9 % — ABNORMAL LOW (ref 39.0–52.0)
Hemoglobin: 11.8 g/dL — ABNORMAL LOW (ref 13.0–17.0)
Lymphocytes Relative: 40 % (ref 12–46)
Lymphs Abs: 2.3 10*3/uL (ref 0.7–4.0)
MCH: 24.5 pg — ABNORMAL LOW (ref 26.0–34.0)
MCHC: 32.9 g/dL (ref 30.0–36.0)
MCV: 74.5 fL — ABNORMAL LOW (ref 78.0–100.0)
MONO ABS: 0.6 10*3/uL (ref 0.1–1.0)
Monocytes Relative: 10 % (ref 3–12)
NEUTROS ABS: 2.7 10*3/uL (ref 1.7–7.7)
NEUTROS PCT: 48 % (ref 43–77)
Platelets: 287 10*3/uL (ref 150–400)
RBC: 4.82 MIL/uL (ref 4.22–5.81)
RDW: 16 % — AB (ref 11.5–15.5)
WBC: 5.7 10*3/uL (ref 4.0–10.5)

## 2013-08-07 LAB — CMP AND LIVER
ALT: 17 U/L (ref 0–53)
AST: 17 U/L (ref 0–37)
Albumin: 3.6 g/dL (ref 3.5–5.2)
Alkaline Phosphatase: 79 U/L (ref 39–117)
BILIRUBIN TOTAL: 0.4 mg/dL (ref 0.2–1.2)
BUN: 25 mg/dL — ABNORMAL HIGH (ref 6–23)
Bilirubin, Direct: 0.1 mg/dL (ref 0.0–0.3)
CALCIUM: 9.1 mg/dL (ref 8.4–10.5)
CHLORIDE: 102 meq/L (ref 96–112)
CO2: 28 mEq/L (ref 19–32)
Creat: 1.65 mg/dL — ABNORMAL HIGH (ref 0.50–1.35)
Glucose, Bld: 83 mg/dL (ref 70–99)
Indirect Bilirubin: 0.3 mg/dL (ref 0.2–1.2)
Potassium: 4.7 mEq/L (ref 3.5–5.3)
SODIUM: 137 meq/L (ref 135–145)
TOTAL PROTEIN: 6.7 g/dL (ref 6.0–8.3)

## 2013-08-07 LAB — IRON AND TIBC
%SAT: 14 % — ABNORMAL LOW (ref 20–55)
IRON: 38 ug/dL — AB (ref 42–165)
TIBC: 270 ug/dL (ref 215–435)
UIBC: 232 ug/dL (ref 125–400)

## 2013-08-07 LAB — LIPID PANEL
Cholesterol: 149 mg/dL (ref 0–200)
HDL: 46 mg/dL (ref >39)
LDL CALC: 84 mg/dL (ref 0–99)
Total CHOL/HDL Ratio: 3.2 Ratio
Triglycerides: 95 mg/dL (ref <150)
VLDL: 19 mg/dL (ref 0–40)

## 2013-08-07 LAB — HEMOGLOBIN A1C
Hgb A1c MFr Bld: 7.5 % — ABNORMAL HIGH (ref <5.7)
Mean Plasma Glucose: 169 mg/dL — ABNORMAL HIGH (ref <117)

## 2013-08-07 LAB — MAGNESIUM: MAGNESIUM: 2.1 mg/dL (ref 1.5–2.5)

## 2013-08-07 LAB — FOLATE

## 2013-08-07 LAB — VITAMIN B12: VITAMIN B 12: 1426 pg/mL — AB (ref 211–911)

## 2013-08-08 LAB — VITAMIN D 25 HYDROXY (VIT D DEFICIENCY, FRACTURES): Vit D, 25-Hydroxy: 52 ng/mL (ref 30–89)

## 2013-08-13 ENCOUNTER — Other Ambulatory Visit (INDEPENDENT_AMBULATORY_CARE_PROVIDER_SITE_OTHER): Payer: Self-pay | Admitting: General Surgery

## 2013-08-14 NOTE — Telephone Encounter (Signed)
Can this patient have this Rx 

## 2013-08-14 NOTE — Telephone Encounter (Signed)
Yes he can have

## 2013-08-15 ENCOUNTER — Other Ambulatory Visit (INDEPENDENT_AMBULATORY_CARE_PROVIDER_SITE_OTHER): Payer: Self-pay | Admitting: General Surgery

## 2013-08-20 ENCOUNTER — Telehealth: Payer: Self-pay | Admitting: *Deleted

## 2013-08-20 NOTE — Telephone Encounter (Signed)
[facility] on [address] requests carvedilol refill for patient. Thanks, MI

## 2013-08-21 ENCOUNTER — Ambulatory Visit: Payer: 59 | Admitting: Dietician

## 2013-08-21 MED ORDER — CARVEDILOL 25 MG PO TABS: 12.5000 mg | ORAL_TABLET | Freq: Two times a day (BID) | ORAL | Status: DC

## 2013-08-21 NOTE — Telephone Encounter (Signed)
Done

## 2013-09-19 ENCOUNTER — Ambulatory Visit: Payer: 59 | Admitting: Dietician

## 2013-10-17 ENCOUNTER — Encounter: Payer: 59 | Attending: General Surgery | Admitting: Dietician

## 2013-10-17 VITALS — Ht 73.0 in | Wt 342.0 lb

## 2013-10-17 DIAGNOSIS — Z6841 Body Mass Index (BMI) 40.0 and over, adult: Secondary | ICD-10-CM | POA: Insufficient documentation

## 2013-10-17 DIAGNOSIS — Z713 Dietary counseling and surveillance: Secondary | ICD-10-CM | POA: Insufficient documentation

## 2013-10-17 DIAGNOSIS — E669 Obesity, unspecified: Secondary | ICD-10-CM | POA: Insufficient documentation

## 2013-10-17 NOTE — Patient Instructions (Addendum)
-  Always keep candy on hand for low blood sugars -Consider asking Dr. Laurann Montana for a referral for diabetes -Keep exercising!!!   TANITA  BODY COMP RESULTS  03/19/13 04/25/2013 05/23/2013 10/17/13   BMI (kg/m^2) 51.6 49.8 47.8 45.1   Fat Mass (lbs) 136.5 109 113.5 115   Fat Free Mass (lbs) 254.5 268.5 248.5 227   Total Body Water (lbs) 186.5 196.5 182 166

## 2013-10-17 NOTE — Progress Notes (Signed)
Follow-up visit:  7 months Post-Operative RYGB Surgery  Medical Nutrition Therapy:  Appt start time: 8280 end time: 515   Primary concerns today:   Feeling much better with weight loss. Tastes have changed; dislikes french fries, fried foods. Otherwise, can tolerate most foods but in smaller amounts.  Surgery date: 03/05/13 Surgery type: RYGB Starting weight at [facility]: 430 lbs on 12/26/12  Weight today: 342 lbs Weight change: 20 lbs Total weight lost: 88 lbs  TANITA  BODY COMP RESULTS  03/19/13 04/25/2013 05/23/2013 10/17/13   BMI (kg/m^2) 51.6 49.8 47.8 45.1   Fat Mass (lbs) 136.5 109 113.5 115   Fat Free Mass (lbs) 254.5 268.5 248.5 227   Total Body Water (lbs) 186.5 196.5 182 166    Preferred Learning Style:  No preference indicated   Learning Readiness:  Ready   24-hr recall: B: Protein shake (30 g) Snk (AM): eggs, bacon, 1 piece bread, cheese, sometimes fruit (12 g) L (PM): beans, 1/2 hamburger or hotdog, usually without bun (14-21 g)  Snk (PM): hard candy if BG is low D (PM): 3 oz meat or soup (21g) Snk (PM): 6 oz milk   Fluid intake:  88 oz (milk, water, protein shakes, vitamin water zero) Estimated total protein intake: 80 g  Medications: see list; taking 1/2 of Coreg Supplementation: taking  CBG monitoring: yes, every day Average CBG per patient: fluctuating a lot; having difficulty adjusting insulin Last patient reported A1c: 7.5%  Using straws: no Drinking while eating: no Hair loss: no Carbonated beverages: no N/V/D/C: none  Dumping syndrome: no  Recent physical activity: bike riding and weights and walking (average 9,000 steps)  Progress Towards Goal(s):  In progress.   Nutritional Diagnosis:  [place]-3.3 Overweight/obesity related to past poor dietary habits and physical inactivity as evidenced by patient w/ recent RYGB surgery following dietary guidelines for continued weight loss.    Intervention:  Nutrition education provided.  Teaching Method  Utilized:  Visual Auditory  Barriers to learning/adherence to lifestyle change: food preferences  Demonstrated degree of understanding via:  Teach Back   Monitoring/Evaluation:  Dietary intake, exercise, and body weight. Follow up in 3 months for 10 month post-op visit.

## 2013-11-02 ENCOUNTER — Other Ambulatory Visit: Payer: Self-pay

## 2013-11-02 ENCOUNTER — Telehealth: Payer: Self-pay

## 2013-11-02 MED ORDER — CARVEDILOL 25 MG PO TABS: 12.5000 mg | ORAL_TABLET | Freq: Two times a day (BID) | ORAL | Status: DC

## 2013-11-02 NOTE — Telephone Encounter (Signed)
Ok to refill 

## 2013-11-04 ENCOUNTER — Other Ambulatory Visit (INDEPENDENT_AMBULATORY_CARE_PROVIDER_SITE_OTHER): Payer: Self-pay | Admitting: General Surgery

## 2013-11-14 ENCOUNTER — Ambulatory Visit (INDEPENDENT_AMBULATORY_CARE_PROVIDER_SITE_OTHER): Payer: 59 | Admitting: General Surgery

## 2013-12-06 ENCOUNTER — Encounter: Payer: 59 | Attending: General Surgery | Admitting: *Deleted

## 2013-12-06 ENCOUNTER — Encounter: Payer: Self-pay | Admitting: *Deleted

## 2013-12-06 VITALS — Ht 73.0 in | Wt 344.5 lb

## 2013-12-06 DIAGNOSIS — Z713 Dietary counseling and surveillance: Secondary | ICD-10-CM | POA: Insufficient documentation

## 2013-12-06 DIAGNOSIS — Z794 Long term (current) use of insulin: Secondary | ICD-10-CM

## 2013-12-06 DIAGNOSIS — E669 Obesity, unspecified: Secondary | ICD-10-CM | POA: Diagnosis present

## 2013-12-06 DIAGNOSIS — Z6841 Body Mass Index (BMI) 40.0 and over, adult: Secondary | ICD-10-CM | POA: Insufficient documentation

## 2013-12-06 DIAGNOSIS — E119 Type 2 diabetes mellitus without complications: Secondary | ICD-10-CM

## 2013-12-06 DIAGNOSIS — IMO0001 Reserved for inherently not codable concepts without codable children: Secondary | ICD-10-CM

## 2013-12-06 NOTE — Progress Notes (Signed)
  Medical Nutrition Therapy:  Appt start time: 1607 end time:  1700.  Assessment:  Primary concerns today: 12/06/2013. Patient states he is SMBG 3-4 times a day with reported range of 85-100 in AM, 90-120 before lunch and supper. 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 Enjoys hiking as he is able with joint pain, enjoys bike riding, works on computers at home and does some house / yard work. Lives alone, buys and cooks own meals. Eats out about half the time.    Preferred Learning Style:   No preference indicated   Learning Readiness:   Ready  Change in progress  MEDICATIONS: see list, Diabetes medications are Humalog and Lantus, which he states he is able to give without any problem   DIETARY INTAKE:  24-hr recall:  B ( AM): protein shake, occasionally decaf coffee   Snk ( AM): egg, bacon, yogurt,  L ( PM): brings or buys: meat, vegetable, and starch, no beverage with meals  Snk ( PM): water, diet green tea, occasionally pretzel, occasionally candy D ( PM): meat, vegetable, starch Snk ( PM): watermelon or other fruit  Beverages: water, diet Green tea  Usual physical activity: activities of daily living such as house / yard work   Estimated energy needs: 1600 calories 180 g carbohydrates 120 g protein 44 g fat  Progress Towards Goal(s):  In progress.   Nutritional Diagnosis:  NB-1.1 Food and nutrition-related knowledge deficit As related to Diabetes.  As evidenced by A1c of 7.5% in May, 2015.    Intervention:  Nutrition .counseling and diabetes education initiated. Discussed Carb Counting as method of portion control, reading food labels, and benefits of increased activity. Also reviewed action time of Humalog and Lantus. Also described opportunity to learn to adjust Humalog based on actual Carb intake if he is interested going forward.  Plan:  Aim for 2-4 Carb Choices per meal (30-60 grams)   Aim for 0-2 Carbs per snack if hungry  Include protein in moderation  with your meals and snacks Consider reading food labels for Total Carbohydrate of foods Consider options for increasing your activity level as tolerated Continue checking BG at alternate times per day as directed by MD  Consider recording BG, food and insulin doses so we can evaluate a more accurate formula for your insulin doses  Teaching Method Utilized: Visual, Auditory and Hands on  Handouts given during visit include: Living Well with Diabetes Carb Counting and Food Label handouts Meal Plan Card  Insulin action handout  Barriers to learning/adherence to lifestyle change: limited activity due to obesity  Demonstrated degree of understanding via:  Teach Back   Monitoring/Evaluation:  Dietary intake, exercise, reading food labels, SMBG, and body weight prn.

## 2013-12-06 NOTE — Patient Instructions (Signed)
Plan:  Aim for 2-4 Carb Choices per meal (30-60 grams)   Aim for 0-2 Carbs per snack if hungry  Include protein in moderation with your meals and snacks Consider reading food labels for Total Carbohydrate of foods Consider options for increasing your activity level as tolerated Continue checking BG at alternate times per day as directed by MD  Consider recording BG, food and insulin doses so we can evaluate a more accurate formula for your insulin doses

## 2013-12-28 ENCOUNTER — Ambulatory Visit (INDEPENDENT_AMBULATORY_CARE_PROVIDER_SITE_OTHER): Payer: 59 | Admitting: General Surgery

## 2014-01-17 ENCOUNTER — Encounter: Payer: 59 | Attending: General Surgery | Admitting: Dietician

## 2014-01-17 VITALS — Ht 73.0 in | Wt 343.5 lb

## 2014-01-17 DIAGNOSIS — E669 Obesity, unspecified: Secondary | ICD-10-CM

## 2014-01-17 DIAGNOSIS — Z6841 Body Mass Index (BMI) 40.0 and over, adult: Secondary | ICD-10-CM | POA: Diagnosis not present

## 2014-01-17 DIAGNOSIS — Z713 Dietary counseling and surveillance: Secondary | ICD-10-CM | POA: Diagnosis not present

## 2014-01-17 NOTE — Patient Instructions (Addendum)
-  Focus on protein and non-starchy vegetables!  -Keep healthier high-protein foods at work  -[employer], yogurt, deli meat, low fat cheese sticks, beef jerky, protein shakes  -Avoid getting too hungry (have something to eat with protein every 3-5 hours)

## 2014-01-17 NOTE — Progress Notes (Signed)
Follow-up visit:  10 months Post-Operative RYGB Surgery  Medical Nutrition Therapy:  Appt start time: 515 end time:  49  Primary concerns today:   Mr. Traum returns stating he has been exercising a lot. Hurt his hip/leg over the summer and wasn't exercising for a while. Just started exercising again a few weeks ago. Admits to eating too many carbs (beans, peas, and corn). He retains fluid and weight fluctuates.  Surgery date: 03/05/13 Surgery type: RYGB Starting weight at [facility]: 430 lbs on 12/26/12  Weight today: 343.5 lbs Weight change: 1.5 lbs gain Total weight lost: 88 lbs Goal weight: 255 lbs  TANITA  BODY COMP RESULTS  03/19/13 04/25/2013 05/23/2013 10/17/13 01/17/14   BMI (kg/m^2) 51.6 49.8 47.8 45.1 45.3   Fat Mass (lbs) 136.5 109 113.5 115 117.5   Fat Free Mass (lbs) 254.5 268.5 248.5 227 226   Total Body Water (lbs) 186.5 196.5 182 166 165.5    Preferred Learning Style:  No preference indicated   Learning Readiness:  Ready   24-hr recall: B: vegetable stew OR 1 egg and [place] sausage and yogurt Snk (AM): protein shake L (PM): chicken tenders, slaw, and baked beans  Snk (PM):  D (PM): 3 oz meat or soup Snk (PM): 6 oz milk   Fluid intake:  88 oz (milk, water, protein shakes, vitamin water zero) Estimated total protein intake: 80 g  Medications: see list; taking 1/2 of Coreg Supplementation: taking  CBG monitoring: yes, every day Average CBG per patient: blood sugars have been more stable (60-190) Last patient reported A1c: 7.5%  Using straws: no Drinking while eating: no Hair loss: no Carbonated beverages: has had half of 2 diet sodas; caused pain N/V/D/C: none  Dumping syndrome: no  Recent physical activity: bike riding and weights 5x a week  Progress Towards Goal(s):  In progress.   Nutritional Diagnosis:  [place]-3.3 Overweight/obesity related to past poor dietary habits and physical inactivity as evidenced by patient w/ recent RYGB surgery following  dietary guidelines for continued weight loss.    Intervention:  Nutrition education provided.  Teaching Method Utilized:  Visual Auditory  Barriers to learning/adherence to lifestyle change: food preferences  Demonstrated degree of understanding via:  Teach Back   Monitoring/Evaluation:  Dietary intake, exercise, and body weight. Follow up in 3 months for 13 month post-op visit.

## 2014-02-05 ENCOUNTER — Ambulatory Visit: Payer: 59 | Admitting: *Deleted

## 2014-02-06 ENCOUNTER — Other Ambulatory Visit (INDEPENDENT_AMBULATORY_CARE_PROVIDER_SITE_OTHER): Payer: Self-pay | Admitting: General Surgery

## 2014-02-06 DIAGNOSIS — E1169 Type 2 diabetes mellitus with other specified complication: Secondary | ICD-10-CM

## 2014-02-06 DIAGNOSIS — E669 Obesity, unspecified: Secondary | ICD-10-CM

## 2014-02-06 DIAGNOSIS — Z9884 Bariatric surgery status: Secondary | ICD-10-CM

## 2014-02-06 DIAGNOSIS — D509 Iron deficiency anemia, unspecified: Secondary | ICD-10-CM

## 2014-02-28 LAB — VITAMIN A: VITAMIN A (RETINOIC ACID): 51 ug/dL (ref 38–98)

## 2014-03-01 LAB — VITAMIN B1: Vitamin B1 (Thiamine): 19 nmol/L (ref 8–30)

## 2014-04-22 ENCOUNTER — Ambulatory Visit: Payer: 59 | Admitting: Dietician

## 2014-05-21 ENCOUNTER — Ambulatory Visit: Payer: Self-pay | Admitting: Dietician

## 2014-06-12 ENCOUNTER — Ambulatory Visit: Payer: Self-pay | Admitting: Dietician

## 2014-07-25 ENCOUNTER — Ambulatory Visit: Payer: Self-pay | Admitting: Dietician

## 2014-08-21 ENCOUNTER — Encounter: Payer: Self-pay | Admitting: Dietician

## 2014-08-21 ENCOUNTER — Encounter: Payer: 59 | Attending: Internal Medicine | Admitting: Dietician

## 2014-08-21 DIAGNOSIS — Z6841 Body Mass Index (BMI) 40.0 and over, adult: Secondary | ICD-10-CM | POA: Diagnosis not present

## 2014-08-21 DIAGNOSIS — Z713 Dietary counseling and surveillance: Secondary | ICD-10-CM | POA: Diagnosis not present

## 2014-08-21 NOTE — Patient Instructions (Addendum)
-  Focus on protein and non-starchy vegetables!  -Keep healthier high-protein foods at work  -[employer], yogurt, deli meat, low fat cheese sticks, beef jerky, protein shakes  -Resume exercise routine (walking and biking) -Limit carbohydrate intake  -Eat protein first, then vegetables, then starch  -Try eating breakfast earlier and add a mid morning snack to avoid getting too hungry

## 2014-08-21 NOTE — Progress Notes (Signed)
Follow-up visit:  18 months Post-Operative RYGB Surgery  Medical Nutrition Therapy:  Appt start time: 510 end time:  530  Primary concerns today:   Mr. Vahle returns having maintained his weight. He reports eating some watermelon. 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  Surgery date: 03/05/13 Surgery type: RYGB Starting weight at [facility]: 430 lbs on 12/26/12  Weight today: 343 lbs Weight change: 0 lbs Total weight lost: 88 lbs Goal weight: 255 lbs  TANITA  BODY COMP RESULTS  03/19/13 04/25/2013 05/23/2013 10/17/13 01/17/14 08/21/14   BMI (kg/m^2) 51.6 49.8 47.8 45.1 45.3 45.3   Fat Mass (lbs) 136.5 109 113.5 115 117.5 128.5   Fat Free Mass (lbs) 254.5 268.5 248.5 227 226 214.5   Total Body Water (lbs) 186.5 196.5 182 166 165.5 157    Preferred Learning Style:  No preference indicated   Learning Readiness:  Ready   24-hr recall: B (8:38-18MC): 2-3 slices bacon or 3-4 oz chicken with an egg, may have a wheat thin sandwich and [place] yogurt (14-28g) Snk (AM):  L (PM): 3-4 oz meat and vegetable, sometimes with starch (28g) Snk (PM):  D (PM): see lunch (28g) Snk (PM): watermelon  Fluid intake:  64 oz (water, protein shakes, vitamin water zero, Bai drink, 5 calories) Estimated total protein intake: 80 g  Medications: see list; taking 1/2 of Coreg Supplementation: taking  CBG monitoring: yes, every day Average CBG per patient: blood sugars have been more stable (84-120 mg/dL in the mornings) Last patient reported A1c: 7.1%  Using straws: no Drinking while eating: no Hair loss: no Carbonated beverages: no N/V/D/C: none  Dumping syndrome: no  Recent physical activity: bike riding and weights 5x a week  Progress Towards Goal(s):  In progress.   Nutritional Diagnosis:  [place]-3.3 Overweight/obesity related to past poor dietary habits and physical inactivity as evidenced by patient w/ recent RYGB surgery following dietary guidelines for  continued weight loss.    Intervention:  Nutrition education provided.  Teaching Method Utilized:  Visual Auditory  Barriers to learning/adherence to lifestyle change: food preferences  Demonstrated degree of understanding via:  Teach Back   Monitoring/Evaluation:  Dietary intake, exercise, and body weight. Follow up in 6 months for 24 month post-op visit.

## 2014-08-22 ENCOUNTER — Ambulatory Visit (INDEPENDENT_AMBULATORY_CARE_PROVIDER_SITE_OTHER): Payer: 59 | Admitting: Podiatry

## 2014-08-22 ENCOUNTER — Encounter: Payer: Self-pay | Admitting: Podiatry

## 2014-08-22 VITALS — BP 125/77 | HR 84 | Resp 12

## 2014-08-22 DIAGNOSIS — L600 Ingrowing nail: Secondary | ICD-10-CM

## 2014-08-22 MED ORDER — NEOMYCIN-POLYMYXIN-HC 3.5-10000-1 OT SOLN: OTIC | Status: DC

## 2014-08-22 NOTE — Progress Notes (Signed)
   Subjective:    Patient ID: [name] Sosa, male    DOB: 1960-07-30, 54 y.o.   MRN: [id number]  HPI  Pt stated b/l great toenails been painful on and off for 16 years. The toenails are better today but sometimes get worse when laying in bed.. Tried trim them down and used neosporin.  Review of Systems  All other systems reviewed and are negative.      Objective:   Physical Exam        Assessment & Plan:

## 2014-08-22 NOTE — Patient Instructions (Signed)

## 2014-08-23 NOTE — Progress Notes (Signed)
Subjective:     Patient ID: [name] Sosa, male   DOB: 1960-12-27, 54 y.o.   MRN: [id number]  HPI patient presents with ingrown toenail deformity both borders left hallux medial border right hallux stating that they have been troublesome for him and these tried to trim them and soak them without relief   Review of Systems  All other systems reviewed and are negative.      Objective:   Physical Exam  Constitutional: He is oriented to person, place, and time.  Cardiovascular: Intact distal pulses.   Musculoskeletal: Normal range of motion.  Neurological: He is oriented to person, place, and time.  Skin: Skin is warm.  Nursing note and vitals reviewed.  neurovascular status intact muscle strength adequate range of motion within normal limits. Patient is incurvated left hallux medial lateral borders with pain and right hallux medial border that are incurvated and sore when pressed. Good digital perfusion and patient is well oriented 3     Assessment:     Ingrown toenail deformity left hallux medial lateral border right hallux medial border with pain with pressed    Plan:     H&P and conditions reviewed. Discussed correction and explained risk of procedures and explained to patient surgery. Patient wants procedures and today I infiltrated each hallux 60 mg like Marcaine mixture sterile conditions remove the medial lateral borders left hallux medial border right hallux exposed matrix and applied phenol 3 applications 30 seconds followed by alcohol lavage and sterile dressing. Gave instructions on soaks and reappoint

## 2015-02-20 ENCOUNTER — Encounter: Payer: 59 | Attending: Internal Medicine | Admitting: Dietician

## 2015-02-20 ENCOUNTER — Encounter: Payer: Self-pay | Admitting: Dietician

## 2015-02-20 VITALS — Wt 353.9 lb

## 2015-02-20 DIAGNOSIS — Z713 Dietary counseling and surveillance: Secondary | ICD-10-CM | POA: Insufficient documentation

## 2015-02-20 DIAGNOSIS — E669 Obesity, unspecified: Secondary | ICD-10-CM

## 2015-02-20 NOTE — Patient Instructions (Addendum)
-  Focus on protein and non-starchy vegetables!  -Keep healthier high-protein foods at work  -[employer], yogurt, deli meat, low fat cheese sticks, beef jerky, protein shakes  -Resume exercise routine (walking and biking) -Limit carbohydrate intake  -Eat protein first, then vegetables, then starch  -Try eating breakfast earlier and add a mid morning snack to avoid getting too hungry  -Work on getting back into an overall routine  -Always have structured meals and snacks (put foods on a plate and eat them mindfully)  -Get another exercise machine to have at home

## 2015-02-20 NOTE — Progress Notes (Signed)
Follow-up visit:  24 months Post-Operative RYGB Surgery  Medical Nutrition Therapy:  Appt start time: 500 end time:  530  Primary concerns today:   [name] Sosa returns having gained 10 pounds in the last 6 months. He reports he has not been in a good routine as he lost his job in June. His exercise bike also broke. He has been going to bed late and getting up late. He reports his HgbA1c was 7.6% last week. His meals have not been structured.   Surgery date: 03/05/13 Surgery type: RYGB Starting weight at [facility]: 430 lbs on 12/26/12  Weight today: 353.9 lbs Weight change: 10 lbs gain Total weight lost: 78 lbs Goal weight: 255 lbs  TANITA  BODY COMP RESULTS  03/19/13 04/25/2013 05/23/2013 10/17/13 01/17/14 08/21/14   BMI (kg/m^2) 51.6 49.8 47.8 45.1 45.3 45.3   Fat Mass (lbs) 136.5 109 113.5 115 117.5 128.5   Fat Free Mass (lbs) 254.5 268.5 248.5 227 226 214.5   Total Body Water (lbs) 186.5 196.5 182 166 165.5 157    Preferred Learning Style:  No preference indicated   Learning Readiness:  Ready   24-hr recall: B (11am): chicken or ham, vegetables OR protein shake OR eggs Snk (AM):  L (PM): 3-4 oz meat and vegetable, sometimes with starch (28g) Snk (PM):  D (PM): see lunch (28g) Snk (PM):   Fluid intake:  64 oz (water, [place], vitamin water zero, Bai drink, 5 calories) Estimated total protein intake: 80 g  Medications: see list; taking 1/2 of Coreg Supplementation: taking  CBG monitoring: yes, every day Average CBG per patient: blood sugars have been more stable (84-120 mg/dL in the mornings) Last patient reported A1c: 7.1%  Using straws: no Drinking while eating: no Hair loss: no Carbonated beverages: no N/V/D/C: none  Dumping syndrome: no  Recent physical activity: bike riding and weights 5x a week  Progress Towards Goal(s):  In progress.   Nutritional Diagnosis:  -3.3 Overweight/obesity related to past poor dietary habits and physical inactivity as evidenced  by patient w/ recent RYGB surgery following dietary guidelines for continued weight loss.    Intervention:  Nutrition education provided.  Teaching Method Utilized:  Visual Auditory  Barriers to learning/adherence to lifestyle change: food preferences  Demonstrated degree of understanding via:  Teach Back   Monitoring/Evaluation:  Dietary intake, exercise, and body weight. Follow up in 5 months for 28 month post-op visit.

## 2015-06-26 ENCOUNTER — Ambulatory Visit: Payer: 59 | Admitting: Dietician

## 2015-12-26 ENCOUNTER — Encounter (HOSPITAL_COMMUNITY): Payer: Self-pay

## 2016-04-09 DIAGNOSIS — N183 Chronic kidney disease, stage 3 (moderate): Secondary | ICD-10-CM | POA: Diagnosis not present

## 2016-04-09 DIAGNOSIS — E1122 Type 2 diabetes mellitus with diabetic chronic kidney disease: Secondary | ICD-10-CM | POA: Diagnosis not present

## 2016-04-22 DIAGNOSIS — Z5181 Encounter for therapeutic drug level monitoring: Secondary | ICD-10-CM | POA: Diagnosis not present

## 2016-10-08 ENCOUNTER — Encounter (HOSPITAL_COMMUNITY): Payer: Self-pay

## 2016-11-01 DIAGNOSIS — Z0000 Encounter for general adult medical examination without abnormal findings: Secondary | ICD-10-CM | POA: Diagnosis not present

## 2016-11-01 DIAGNOSIS — E0590 Thyrotoxicosis, unspecified without thyrotoxic crisis or storm: Secondary | ICD-10-CM | POA: Diagnosis not present

## 2016-11-01 DIAGNOSIS — I429 Cardiomyopathy, unspecified: Secondary | ICD-10-CM | POA: Diagnosis not present

## 2016-11-01 DIAGNOSIS — Z794 Long term (current) use of insulin: Secondary | ICD-10-CM | POA: Diagnosis not present

## 2016-11-01 DIAGNOSIS — E1122 Type 2 diabetes mellitus with diabetic chronic kidney disease: Secondary | ICD-10-CM | POA: Diagnosis not present

## 2016-11-01 DIAGNOSIS — N183 Chronic kidney disease, stage 3 (moderate): Secondary | ICD-10-CM | POA: Diagnosis not present

## 2016-11-01 DIAGNOSIS — Z9884 Bariatric surgery status: Secondary | ICD-10-CM | POA: Diagnosis not present

## 2016-11-01 DIAGNOSIS — Z6841 Body Mass Index (BMI) 40.0 and over, adult: Secondary | ICD-10-CM | POA: Diagnosis not present

## 2016-12-08 DIAGNOSIS — Z23 Encounter for immunization: Secondary | ICD-10-CM | POA: Diagnosis not present

## 2017-07-29 DIAGNOSIS — Z794 Long term (current) use of insulin: Secondary | ICD-10-CM | POA: Diagnosis not present

## 2017-07-29 DIAGNOSIS — N183 Chronic kidney disease, stage 3 (moderate): Secondary | ICD-10-CM | POA: Diagnosis not present

## 2017-07-29 DIAGNOSIS — K219 Gastro-esophageal reflux disease without esophagitis: Secondary | ICD-10-CM | POA: Diagnosis not present

## 2017-07-29 DIAGNOSIS — E1122 Type 2 diabetes mellitus with diabetic chronic kidney disease: Secondary | ICD-10-CM | POA: Diagnosis not present

## 2017-07-29 DIAGNOSIS — Z9884 Bariatric surgery status: Secondary | ICD-10-CM | POA: Diagnosis not present

## 2017-10-27 DIAGNOSIS — I429 Cardiomyopathy, unspecified: Secondary | ICD-10-CM | POA: Diagnosis not present

## 2017-10-27 DIAGNOSIS — R05 Cough: Secondary | ICD-10-CM | POA: Diagnosis not present

## 2017-11-15 DIAGNOSIS — E0590 Thyrotoxicosis, unspecified without thyrotoxic crisis or storm: Secondary | ICD-10-CM | POA: Diagnosis not present

## 2017-11-15 DIAGNOSIS — I429 Cardiomyopathy, unspecified: Secondary | ICD-10-CM | POA: Diagnosis not present

## 2017-11-15 DIAGNOSIS — N183 Chronic kidney disease, stage 3 (moderate): Secondary | ICD-10-CM | POA: Diagnosis not present

## 2017-11-15 DIAGNOSIS — R000 Tachycardia, unspecified: Secondary | ICD-10-CM | POA: Diagnosis not present

## 2017-11-17 ENCOUNTER — Other Ambulatory Visit: Payer: Self-pay

## 2017-11-17 ENCOUNTER — Other Ambulatory Visit (HOSPITAL_COMMUNITY): Payer: Self-pay | Admitting: Internal Medicine

## 2017-11-17 ENCOUNTER — Ambulatory Visit (HOSPITAL_COMMUNITY): Payer: BLUE CROSS/BLUE SHIELD | Attending: Cardiology

## 2017-11-17 DIAGNOSIS — I429 Cardiomyopathy, unspecified: Secondary | ICD-10-CM

## 2017-11-17 DIAGNOSIS — G4733 Obstructive sleep apnea (adult) (pediatric): Secondary | ICD-10-CM | POA: Diagnosis not present

## 2017-11-17 DIAGNOSIS — Z6841 Body Mass Index (BMI) 40.0 and over, adult: Secondary | ICD-10-CM | POA: Diagnosis not present

## 2017-11-17 DIAGNOSIS — I340 Nonrheumatic mitral (valve) insufficiency: Secondary | ICD-10-CM | POA: Insufficient documentation

## 2017-11-17 DIAGNOSIS — N183 Chronic kidney disease, stage 3 (moderate): Secondary | ICD-10-CM | POA: Insufficient documentation

## 2017-11-17 DIAGNOSIS — E1122 Type 2 diabetes mellitus with diabetic chronic kidney disease: Secondary | ICD-10-CM | POA: Insufficient documentation

## 2017-11-17 DIAGNOSIS — E785 Hyperlipidemia, unspecified: Secondary | ICD-10-CM | POA: Diagnosis not present

## 2017-11-17 MED ORDER — PERFLUTREN LIPID MICROSPHERE
1.0000 mL | INTRAVENOUS | Status: AC | PRN
  Administered 2017-11-17: 2 mL via INTRAVENOUS

## 2017-11-25 ENCOUNTER — Encounter: Payer: Self-pay | Admitting: Cardiology

## 2017-11-25 ENCOUNTER — Ambulatory Visit (INDEPENDENT_AMBULATORY_CARE_PROVIDER_SITE_OTHER): Payer: BLUE CROSS/BLUE SHIELD | Admitting: Cardiology

## 2017-11-25 VITALS — BP 132/76 | HR 90 | Ht 73.0 in | Wt 364.0 lb

## 2017-11-25 DIAGNOSIS — Z9884 Bariatric surgery status: Secondary | ICD-10-CM

## 2017-11-25 DIAGNOSIS — I428 Other cardiomyopathies: Secondary | ICD-10-CM | POA: Diagnosis not present

## 2017-11-25 DIAGNOSIS — E119 Type 2 diabetes mellitus without complications: Secondary | ICD-10-CM | POA: Diagnosis not present

## 2017-11-25 DIAGNOSIS — Z794 Long term (current) use of insulin: Secondary | ICD-10-CM

## 2017-11-25 DIAGNOSIS — IMO0001 Reserved for inherently not codable concepts without codable children: Secondary | ICD-10-CM

## 2017-11-25 DIAGNOSIS — N183 Chronic kidney disease, stage 3 unspecified: Secondary | ICD-10-CM

## 2017-11-25 HISTORY — DX: Other cardiomyopathies: I42.8

## 2017-11-25 NOTE — Progress Notes (Signed)
Cardiology Office Note:    Date:  11/25/2017   ID:  [name] Sosa, DOB [birth year], MRN [id number]  PCP:  Lavone Orn, MD  Cardiologist:  Jenean Lindau, MD   Referring MD: Lavone Orn, MD    ASSESSMENT:    1. Nonischemic cardiomyopathy ([place])   2. CKD (chronic kidney disease), stage III ([place])   3. History of Lap Roux-en-Y gastric bypass 03/05/13   4. IDDM (insulin dependent diabetes mellitus) ([place])   5. Morbid obesity ([place])    PLAN:    In order of problems listed above:  1. Congestive heart failure education was given to the patient including diet at extensive length and he vocalized understanding.  Weight reduction was stressed.  Risks of obesity explained and he vocalized understanding. 2. In view of his condition I would like to start him on Entresto and therefore we will have a Chem-7 and other blood work done today. 3. Lexiscan sestamibi will be done to assess coronary status.  He has had history of nonischemic cardiomyopathy but in the past.  I explained to him about his condition and the importance of taking care of him of himself at extensive length and he vocalized understanding. 4. He will be seen in follow-up appointment in a month or earlier if he has any concerns.   Medication Adjustments/Labs and Tests Ordered: Current medicines are reviewed at length with the patient today.  Concerns regarding medicines are outlined above.  No orders of the defined types were placed in this encounter.  No orders of the defined types were placed in this encounter.    History of Present Illness:    [name] MASELLI is a 57 y.o. male who is being seen today for the evaluation of cardiomyopathy at the request of Lavone Orn, MD.  Patient is a pleasant 57 year old male.  He has past medical history of cardiomyopathy.  His ejection fraction were more like 45%.  He quit seeing his cardiologist and was noncompliant with care.  He is now ejection fraction is in the 20s and he  is here for evaluation.  He denies any chest pain orthopnea or PND.  At the time of my evaluation, the patient is alert awake oriented and in no distress.  No chest pain  Past Medical History:  Diagnosis Date  . Anemia   . Anxiety    HX OF ANXIETY ATTACKS--CAN'T SLEEP ON BACK-FEELS LIKE HE CAN'T BREATHE  . Apnea, sleep    SEVERE OSA PER STUDY 12/01/12  . Cancer ([place])    HX OF NON HODGKIN'S LYMPHOMA -IN REMISSION  . Cardiomyopathy, nonischemic ([place])    DX 2003 CARDIAC CATH, ECHO 2006 SHOWED EF IMPROVED TO 45-50%--PER CARDIOLOGY OFFICE NOTES DR. Linard Millers FROM 01/20/10  . CHF (congestive heart failure) ([place]) 2003  . Chronic kidney disease    HX OF CHRONIC KIDNEY DISEASE STAGE III - EVALUATED AT [facility] IN THE PAST AND FELT TO BE RELATED TO NSAID'S AND CHEMO.  PT  STATES HE HAS NOT SEEN KIDNEY SPECIALIST AT [facility] IN OVER A YEAR.  Marland Kitchen Chronic systolic heart failure ([place])    CARDIOLOGIST IS DR. Linard Millers  . Claustrophobia   . Diabetes mellitus   . GERD (gastroesophageal reflux disease)    NOT TAKING ANY MEDS FOR REFLUX  . Hyperlipidemia   . Hypertension   . Morbid obesity ([place])   . Multiple thyroid nodules    HX OF NODULES=PT STATES BIOPSY-TOLD ALL OKAY  . Non-Hodgkin lymphoma ([place])  DX ABOUT 2007-TX'D WITH CHEMO-IN REMISSION  . Shortness of breath    WALKING UPSTAIRS    Past Surgical History:  Procedure Laterality Date  . COLONOSCOPY WITH PROPOFOL N/A 01/09/2013   Procedure: COLONOSCOPY WITH PROPOFOL;  Surgeon: Garlan Fair, MD;  Location: [facility];  Service: Endoscopy;  Laterality: N/A;  . GASTRIC ROUX-EN-Y N/A 03/05/2013   Procedure: LAPAROSCOPIC ROUX-EN-Y GASTRIC BYPASS WITH UPPER ENDOSCOPY;  Surgeon: Gayland Curry, MD;  Location: [facility];  Service: General;  Laterality: N/A;  . HYDROCELE EXCISION  02/09/2011   Procedure: HYDROCELECTOMY ADULT;  Surgeon: Fredricka Bonine, MD;  Location: [facility];  Service: Urology;  Laterality: Right;    Current  Medications: Current Meds  Medication Sig  . acetaminophen (TYLENOL) 500 MG tablet Take 1,000 mg by mouth every 6 (six) hours as needed. pain  . ALPRAZolam (XANAX) 0.5 MG tablet Take 0.5 mg by mouth 2 (two) times daily as needed for anxiety.   Marland Kitchen aspirin 81 MG tablet Take 81 mg by mouth daily.  . Azelastine-Fluticasone (DYMISTA) 137-50 MCG/ACT SUSP Place 1 spray into the nose 2 (two) times daily.  . carvedilol (COREG) 25 MG tablet Take 0.5 tablets (12.5 mg total) by mouth 2 (two) times daily with a meal.  . ferrous sulfate 325 (65 FE) MG tablet Take 325 mg by mouth 2 (two) times daily.   Marland Kitchen glucose blood test strip 1 each by Other route as needed.   . insulin glargine (LANTUS) 100 UNIT/ML injection Inject into the skin.  . Insulin Lispro, Human, (HUMALOG KWIKPEN [place]) Inject 20-25 Units into the skin 3 (three) times daily before meals.  Marland Kitchen LANTUS SOLOSTAR 100 UNIT/ML Solostar Pen   . Multiple Vitamin (MULTIVITAMIN WITH MINERALS) TABS tablet Take 1 tablet by mouth 2 (two) times daily.   . ONE TOUCH ULTRA TEST test strip   . ramipril (ALTACE) 5 MG capsule Take 5 mg by mouth 2 (two) times daily. Wait to resume this until after appt with PCP   . zolpidem (AMBIEN) 10 MG tablet Take 10 mg by mouth at bedtime as needed for sleep.      Allergies:   Aciphex [rabeprazole sodium] and Nsaids   Social History   Socioeconomic History  . Marital status: Single    Spouse name: Not on file  . Number of children: Not on file  . Years of education: Not on file  . Highest education level: Not on file  Occupational History  . Not on file  Social Needs  . Financial resource strain: Not on file  . Food insecurity:    Worry: Not on file    Inability: Not on file  . Transportation needs:    Medical: Not on file    Non-medical: Not on file  Tobacco Use  . Smoking status: Never Smoker  . Smokeless tobacco: Never Used  Substance and Sexual Activity  . Alcohol use: Yes    Comment: RARELY  . Drug use: No   . Sexual activity: Not on file  Lifestyle  . Physical activity:    Days per week: Not on file    Minutes per session: Not on file  . Stress: Not on file  Relationships  . Social connections:    Talks on phone: Not on file    Gets together: Not on file    Attends religious service: Not on file    Active member of club or organization: Not on file    Attends meetings of clubs or  organizations: Not on file    Relationship status: Not on file  Other Topics Concern  . Not on file  Social History Narrative  . Not on file     Family History: The patient's family history is not on file. He was adopted.  ROS:   Please see the history of present illness.    All other systems reviewed and are negative.  EKGs/Labs/Other Studies Reviewed:    The following studies were reviewed today: I discussed my findings with the patient at extensive length including echocardiogram.   Recent Labs: No results found for requested labs within last 8760 hours.  Recent Lipid Panel    Component Value Date/Time   CHOL 149 08/07/2013 0815   TRIG 95 08/07/2013 0815   HDL 46 08/07/2013 0815   CHOLHDL 3.2 08/07/2013 0815   VLDL 19 08/07/2013 0815   LDLCALC 84 08/07/2013 0815    Physical Exam:    VS:  BP 132/76 (BP Location: Right Arm, Patient Position: Sitting, Cuff Size: Normal)   Pulse 90   Ht 6\' 1"  (1.854 m)   Wt (!) 364 lb (165.1 kg)   SpO2 98%   BMI 48.02 kg/m     Wt Readings from Last 3 Encounters:  11/25/17 (!) 364 lb (165.1 kg)  02/20/15 (!) 353 lb 14.4 oz (160.5 kg)  08/21/14 (!) 343 lb (155.6 kg)     GEN: Patient is in no acute distress HEENT: Normal NECK: No JVD; No carotid bruits LYMPHATICS: No lymphadenopathy CARDIAC: S1 S2 regular, 2/6 systolic murmur at the apex. RESPIRATORY:  Clear to auscultation without rales, wheezing or rhonchi  ABDOMEN: Soft, non-tender, non-distended MUSCULOSKELETAL:  No edema; No deformity  SKIN: Warm and dry NEUROLOGIC:  Alert and oriented x  3 PSYCHIATRIC:  Normal affect    Signed, Jenean Lindau, MD  11/25/2017 4:26 PM    [facility]

## 2017-11-25 NOTE — Patient Instructions (Signed)
Medication Instructions:  Your physician recommends that you continue on your current medications as directed. Please refer to the Current Medication list given to you today.  Labwork: Your physician recommends that you have the following labs drawn: BMP, CBC, TSH, and liver panel.  Testing/Procedures: Your physician has requested that you have a lexiscan myoview. For further information please visit [email or web address]. Please follow instruction sheet, as given.  Follow-Up: Your physician recommends that you schedule a follow-up appointment in: 1 month  Any Other Special Instructions Will Be Listed Below (If Applicable).     If you need a refill on your cardiac medications before your next appointment, please call your pharmacy.   [place], RN, BSN

## 2017-11-26 LAB — HEPATIC FUNCTION PANEL
ALT: 17 IU/L (ref 0–44)
AST: 19 IU/L (ref 0–40)
Albumin: 4.2 g/dL (ref 3.5–5.5)
Alkaline Phosphatase: 77 IU/L (ref 39–117)
Bilirubin Total: 0.3 mg/dL (ref 0.0–1.2)
Bilirubin, Direct: 0.12 mg/dL (ref 0.00–0.40)
Total Protein: 7.1 g/dL (ref 6.0–8.5)

## 2017-11-26 LAB — BASIC METABOLIC PANEL
BUN/Creatinine Ratio: 15 (ref 9–20)
BUN: 29 mg/dL — ABNORMAL HIGH (ref 6–24)
CO2: 26 mmol/L (ref 20–29)
Calcium: 9 mg/dL (ref 8.7–10.2)
Chloride: 97 mmol/L (ref 96–106)
Creatinine, Ser: 2 mg/dL — ABNORMAL HIGH (ref 0.76–1.27)
GFR calc Af Amer: 41 mL/min/{1.73_m2} — ABNORMAL LOW (ref >59)
GFR calc non Af Amer: 35 mL/min/{1.73_m2} — ABNORMAL LOW (ref >59)
Glucose: 201 mg/dL — ABNORMAL HIGH (ref 65–99)
Potassium: 4.7 mmol/L (ref 3.5–5.2)
Sodium: 137 mmol/L (ref 134–144)

## 2017-11-26 LAB — LIPID PANEL
CHOLESTEROL TOTAL: 184 mg/dL (ref 100–199)
Chol/HDL Ratio: 3.4 ratio (ref 0.0–5.0)
HDL: 54 mg/dL (ref >39)
LDL Calculated: 97 mg/dL (ref 0–99)
Triglycerides: 167 mg/dL — ABNORMAL HIGH (ref 0–149)
VLDL Cholesterol Cal: 33 mg/dL (ref 5–40)

## 2017-11-26 LAB — CBC WITH DIFFERENTIAL/PLATELET
BASOS: 0 %
Basophils Absolute: 0 10*3/uL (ref 0.0–0.2)
EOS (ABSOLUTE): 0.1 10*3/uL (ref 0.0–0.4)
EOS: 2 %
HEMATOCRIT: 41.8 % (ref 37.5–51.0)
Hemoglobin: 13.7 g/dL (ref 13.0–17.7)
Immature Grans (Abs): 0 10*3/uL (ref 0.0–0.1)
Immature Granulocytes: 0 %
LYMPHS ABS: 2.7 10*3/uL (ref 0.7–3.1)
Lymphs: 40 %
MCH: 26.2 pg — ABNORMAL LOW (ref 26.6–33.0)
MCHC: 32.8 g/dL (ref 31.5–35.7)
MCV: 80 fL (ref 79–97)
MONOS ABS: 0.8 10*3/uL (ref 0.1–0.9)
Monocytes: 13 %
Neutrophils Absolute: 3 10*3/uL (ref 1.4–7.0)
Neutrophils: 45 %
Platelets: 277 10*3/uL (ref 150–450)
RBC: 5.23 x10E6/uL (ref 4.14–5.80)
RDW: 13.4 % (ref 12.3–15.4)
WBC: 6.7 10*3/uL (ref 3.4–10.8)

## 2017-11-26 LAB — TSH: TSH: 0.06 u[IU]/mL — ABNORMAL LOW (ref 0.450–4.500)

## 2017-11-28 NOTE — Addendum Note (Signed)
Addended by: Jerl Santos R on: 11/28/2017 04:11 PM   Modules accepted: Orders

## 2017-11-29 ENCOUNTER — Other Ambulatory Visit: Payer: Self-pay

## 2017-11-29 MED ORDER — SACUBITRIL-VALSARTAN 24-26 MG PO TABS: 1.0000 | ORAL_TABLET | Freq: Two times a day (BID) | ORAL | 2 refills | Status: DC

## 2017-11-29 NOTE — Addendum Note (Signed)
Addended by: Mattie Marlin on: 11/29/2017 02:22 PM   Modules accepted: Orders

## 2017-12-06 ENCOUNTER — Telehealth (HOSPITAL_COMMUNITY): Payer: Self-pay | Admitting: *Deleted

## 2017-12-06 DIAGNOSIS — Z0000 Encounter for general adult medical examination without abnormal findings: Secondary | ICD-10-CM | POA: Diagnosis not present

## 2017-12-06 DIAGNOSIS — Z9884 Bariatric surgery status: Secondary | ICD-10-CM | POA: Diagnosis not present

## 2017-12-06 DIAGNOSIS — Z23 Encounter for immunization: Secondary | ICD-10-CM | POA: Diagnosis not present

## 2017-12-06 DIAGNOSIS — E1122 Type 2 diabetes mellitus with diabetic chronic kidney disease: Secondary | ICD-10-CM | POA: Diagnosis not present

## 2017-12-06 DIAGNOSIS — K912 Postsurgical malabsorption, not elsewhere classified: Secondary | ICD-10-CM | POA: Diagnosis not present

## 2017-12-06 DIAGNOSIS — Z125 Encounter for screening for malignant neoplasm of prostate: Secondary | ICD-10-CM | POA: Diagnosis not present

## 2017-12-06 DIAGNOSIS — E7800 Pure hypercholesterolemia, unspecified: Secondary | ICD-10-CM | POA: Diagnosis not present

## 2017-12-06 DIAGNOSIS — Z1159 Encounter for screening for other viral diseases: Secondary | ICD-10-CM | POA: Diagnosis not present

## 2017-12-06 DIAGNOSIS — N183 Chronic kidney disease, stage 3 (moderate): Secondary | ICD-10-CM | POA: Diagnosis not present

## 2017-12-06 DIAGNOSIS — Z8572 Personal history of non-Hodgkin lymphomas: Secondary | ICD-10-CM | POA: Diagnosis not present

## 2017-12-06 NOTE — Telephone Encounter (Signed)
Patient given detailed instructions per Myocardial Perfusion Study Information Sheet for the test on 12/12/17 at 1230. Patient notified to arrive 15 minutes early and that it is imperative to arrive on time for appointment to keep from having the test rescheduled.  If you need to cancel or reschedule your appointment, please call the office within 24 hours of your appointment. . Patient verbalized understanding.Telisa Ohlsen, Ranae [place]ms

## 2017-12-09 ENCOUNTER — Ambulatory Visit (INDEPENDENT_AMBULATORY_CARE_PROVIDER_SITE_OTHER): Payer: BLUE CROSS/BLUE SHIELD | Admitting: Cardiology

## 2017-12-09 VITALS — BP 112/78 | HR 90

## 2017-12-09 DIAGNOSIS — N183 Chronic kidney disease, stage 3 unspecified: Secondary | ICD-10-CM

## 2017-12-09 NOTE — Patient Instructions (Addendum)
Medication Instructions:  Your physician recommends that you continue on your current medications as directed. Please refer to the Current Medication list given to you today.   Labwork: Your physician recommends that you return for lab work today: BMP.   Testing/Procedures: None  Follow-Up: Your physician recommends that you schedule a follow-up appointment as scheduled with Dr. Geraldo Pitter on 01/30/18 at 4:00 pm in the [facility] office.   If you need a refill on your cardiac medications before your next appointment, please call your pharmacy.   Thank you for choosing [facility]! Robyne Peers, RN [phone]

## 2017-12-10 LAB — BASIC METABOLIC PANEL
BUN / CREAT RATIO: 11 (ref 9–20)
BUN: 23 mg/dL (ref 6–24)
CHLORIDE: 105 mmol/L (ref 96–106)
CO2: 20 mmol/L (ref 20–29)
CREATININE: 2.05 mg/dL — AB (ref 0.76–1.27)
Calcium: 9.3 mg/dL (ref 8.7–10.2)
GFR calc Af Amer: 40 mL/min/{1.73_m2} — ABNORMAL LOW (ref >59)
GFR calc non Af Amer: 34 mL/min/{1.73_m2} — ABNORMAL LOW (ref >59)
GLUCOSE: 176 mg/dL — AB (ref 65–99)
POTASSIUM: 4.5 mmol/L (ref 3.5–5.2)
SODIUM: 139 mmol/L (ref 134–144)

## 2017-12-12 ENCOUNTER — Ambulatory Visit (HOSPITAL_COMMUNITY): Payer: BLUE CROSS/BLUE SHIELD | Attending: Cardiology

## 2017-12-12 VITALS — Ht 73.0 in | Wt 364.0 lb

## 2017-12-12 DIAGNOSIS — Z9884 Bariatric surgery status: Secondary | ICD-10-CM

## 2017-12-12 DIAGNOSIS — Z6841 Body Mass Index (BMI) 40.0 and over, adult: Secondary | ICD-10-CM | POA: Diagnosis not present

## 2017-12-12 DIAGNOSIS — N183 Chronic kidney disease, stage 3 (moderate): Secondary | ICD-10-CM

## 2017-12-12 DIAGNOSIS — I428 Other cardiomyopathies: Secondary | ICD-10-CM

## 2017-12-12 DIAGNOSIS — E119 Type 2 diabetes mellitus without complications: Secondary | ICD-10-CM

## 2017-12-12 DIAGNOSIS — Z794 Long term (current) use of insulin: Secondary | ICD-10-CM

## 2017-12-12 MED ORDER — REGADENOSON 0.4 MG/5ML IV SOLN
0.4000 mg | Freq: Once | INTRAVENOUS | Status: AC
  Administered 2017-12-12: 0.4 mg via INTRAVENOUS

## 2017-12-12 MED ORDER — TECHNETIUM TC 99M TETROFOSMIN IV KIT
32.8000 | PACK | Freq: Once | INTRAVENOUS | Status: AC | PRN
  Administered 2017-12-12: 32.8 via INTRAVENOUS
  Filled 2017-12-12: qty 33

## 2017-12-13 ENCOUNTER — Ambulatory Visit (HOSPITAL_COMMUNITY): Payer: BLUE CROSS/BLUE SHIELD | Attending: Cardiovascular Disease

## 2017-12-13 LAB — MYOCARDIAL PERFUSION IMAGING
CHL CUP NUCLEAR SDS: 4
CHL CUP RESTING HR STRESS: 91 {beats}/min
LV sys vol: 191 mL
LVDIAVOL: 281 mL (ref 62–150)
NUC STRESS TID: 0.95
Peak HR: 107 {beats}/min
SRS: 0
SSS: 4

## 2017-12-13 MED ORDER — TECHNETIUM TC 99M TETROFOSMIN IV KIT
32.5000 | PACK | Freq: Once | INTRAVENOUS | Status: AC | PRN
  Administered 2017-12-13: 32.5 via INTRAVENOUS
  Filled 2017-12-13: qty 33

## 2018-01-05 DIAGNOSIS — Z9884 Bariatric surgery status: Secondary | ICD-10-CM | POA: Diagnosis not present

## 2018-01-05 DIAGNOSIS — I429 Cardiomyopathy, unspecified: Secondary | ICD-10-CM | POA: Diagnosis not present

## 2018-01-05 DIAGNOSIS — E668 Other obesity: Secondary | ICD-10-CM | POA: Diagnosis not present

## 2018-01-05 DIAGNOSIS — Z86010 Personal history of colonic polyps: Secondary | ICD-10-CM | POA: Diagnosis not present

## 2018-01-30 ENCOUNTER — Encounter: Payer: Self-pay | Admitting: Cardiology

## 2018-01-30 ENCOUNTER — Ambulatory Visit (INDEPENDENT_AMBULATORY_CARE_PROVIDER_SITE_OTHER): Payer: BLUE CROSS/BLUE SHIELD | Admitting: Cardiology

## 2018-01-30 VITALS — BP 118/72 | HR 83 | Ht 73.0 in | Wt 369.0 lb

## 2018-01-30 DIAGNOSIS — I428 Other cardiomyopathies: Secondary | ICD-10-CM

## 2018-01-30 DIAGNOSIS — R002 Palpitations: Secondary | ICD-10-CM

## 2018-01-30 NOTE — Patient Instructions (Signed)
Medication Instructions:  Your physician recommends that you continue on your current medications as directed. Please refer to the Current Medication list given to you today.  If you need a refill on your cardiac medications before your next appointment, please call your pharmacy.   Lab work: None  If you have labs (blood work) drawn today and your tests are completely normal, you will receive your results only by: Marland Kitchen MyChart Message (if you have MyChart) OR . A paper copy in the mail If you have any lab test that is abnormal or we need to change your treatment, we will call you to review the results.  Testing/Procedures: Your physician has requested that you have an echocardiogram. Echocardiography is a painless test that uses sound waves to create images of your heart. It provides your doctor with information about the size and shape of your heart and how well your heart's chambers and valves are working. This procedure takes approximately one hour. There are no restrictions for this procedure.  Your physician has recommended that you wear an event monitor. Event monitors are medical devices that record the heart's electrical activity. Doctors most often [place] these monitors to diagnose arrhythmias. Arrhythmias are problems with the speed or rhythm of the heartbeat. The monitor is a small, portable device. You can wear one while you do your normal daily activities. This is usually used to diagnose what is causing palpitations/syncope (passing out).  Follow-Up: At [facility], you and your health needs are our priority.  As part of our continuing mission to provide you with exceptional heart care, we have created designated Provider Care Teams.  These Care Teams include your primary Cardiologist (physician) and Advanced Practice Providers (APPs -  Physician Assistants and Nurse Practitioners) who all work together to provide you with the care you need, when you need it.  You will need a follow  up appointment in 2 months.  Please call our office 2 months in advance to schedule this appointment.  You may see another member of our [employer] Provider Team in [place]: Jenne Campus, MD . Shirlee More, MD  Any Other Special Instructions Will Be Listed Below (If Applicable).  Echo to be done before 2 month f/u

## 2018-01-31 NOTE — Progress Notes (Signed)
Cardiology Office Note:    Date:  01/31/2018   ID:  [name] Sosa, DOB 02/20/1961, MRN [id number]  PCP:  Lavone Orn, MD  Cardiologist:  Jenean Lindau, MD   Referring MD: Lavone Orn, MD    ASSESSMENT:    1. Nonischemic cardiomyopathy (HCC)   2. Palpitation    PLAN:    In order of problems listed above:  1. Primary prevention stressed with patient.  Importance of compliance with diet and medication stressed and he vocalized understanding.  Blood pressure is stable.  Diet was discussed for obesity.  Congestive heart failure issues were discussed risks of obesity explained and he vocalized understanding. 2. In view of his s skipped beat  like sensation I will do an event monitor.  He will be seen in follow-up appointment in 2 months or earlier if he has any concerns at that time we will do a echocardiogram before his next evaluation.  I discussed with him issues such as electrophysiologic testing for primary prevention of sudden cardiac death.  He will also have a 2-week event monitor to assess his palpitations.  He has had a history of lymphoma.  He also has renal insufficiency and has multiple comorbidities which will need to be assessed as we move forward making the decision of possible defibrillator.  At this time he is not keen on LifeVest or such therapy.  I discussed benefits and risks and he vocalized understanding.   Medication Adjustments/Labs and Tests Ordered: Current medicines are reviewed at length with the patient today.  Concerns regarding medicines are outlined above.  Orders Placed This Encounter  Procedures  . LONG TERM MONITOR (3-14 DAYS)  . ECHOCARDIOGRAM COMPLETE   No orders of the defined types were placed in this encounter.    No chief complaint on file.    History of Present Illness:    [name] Sosa is a 57 y.o. male.  Patient has past medical history of cardiomyopathy.  Is on appropriate guideline directed medical therapy at this time.   He denies any chest pain orthopnea or PND.  He walks significantly at work but does not exercise on a regular basis.  He has been not very compliant with diet and losing weight.  At the time of my evaluation, the patient is alert awake oriented and in no distress.  He gives history of occasional skipped beat-like sensation and probably palpitations which are transient but do not give him any dizziness or any such problems.  At the time of my evaluation, the patient is alert awake oriented and in no distress.  Past Medical History:  Diagnosis Date  . Anemia   . Anxiety    HX OF ANXIETY ATTACKS--CAN'T SLEEP ON BACK-FEELS LIKE HE CAN'T BREATHE  . Apnea, sleep    SEVERE OSA PER STUDY 12/01/12  . Cancer ([place])    HX OF NON HODGKIN'S LYMPHOMA -IN REMISSION  . Cardiomyopathy, nonischemic ([place])    DX 2003 CARDIAC CATH, ECHO 2006 SHOWED EF IMPROVED TO 45-50%--PER CARDIOLOGY OFFICE NOTES DR. Linard Millers FROM 01/20/10  . CHF (congestive heart failure) ([place]) 2003  . Chronic kidney disease    HX OF CHRONIC KIDNEY DISEASE STAGE III - EVALUATED AT [facility] IN THE PAST AND FELT TO BE RELATED TO NSAID'S AND CHEMO.  PT  STATES HE HAS NOT SEEN KIDNEY SPECIALIST AT [facility] IN OVER A YEAR.  Marland Kitchen Chronic systolic heart failure ([place])    CARDIOLOGIST IS DR. Linard Millers  . Claustrophobia   .  Diabetes mellitus   . GERD (gastroesophageal reflux disease)    NOT TAKING ANY MEDS FOR REFLUX  . Hyperlipidemia   . Hypertension   . Morbid obesity ([place])   . Multiple thyroid nodules    HX OF NODULES=PT STATES BIOPSY-TOLD ALL OKAY  . Non-Hodgkin lymphoma ([place])    DX ABOUT 2007-TX'D WITH CHEMO-IN REMISSION  . Shortness of breath    WALKING UPSTAIRS    Past Surgical History:  Procedure Laterality Date  . COLONOSCOPY WITH PROPOFOL N/A 01/09/2013   Procedure: COLONOSCOPY WITH PROPOFOL;  Surgeon: Garlan Fair, MD;  [facility];  Service: Endoscopy;  Laterality: N/A;  . GASTRIC ROUX-EN-Y N/A 03/05/2013   Procedure:  LAPAROSCOPIC ROUX-EN-Y GASTRIC BYPASS WITH UPPER ENDOSCOPY;  Surgeon: Gayland Curry, MD;  Location: [facility];  Service: General;  Laterality: N/A;  . HYDROCELE EXCISION  02/09/2011   Procedure: HYDROCELECTOMY ADULT;  Surgeon: Fredricka Bonine, MD;  Location: [facility];  Service: Urology;  Laterality: Right;    Current Medications: Current Meds  Medication Sig  . acetaminophen (TYLENOL) 500 MG tablet Take 1,000 mg by mouth every 6 (six) hours as needed. pain  . ALPRAZolam (XANAX) 0.5 MG tablet Take 0.5 mg by mouth 2 (two) times daily as needed for anxiety.   Marland Kitchen aspirin 81 MG tablet Take 81 mg by mouth daily.  . Azelastine-Fluticasone (DYMISTA) 137-50 MCG/ACT SUSP Place 1 spray into the nose 2 (two) times daily.  . carvedilol (COREG) 25 MG tablet Take 0.5 tablets (12.5 mg total) by mouth 2 (two) times daily with a meal.  . ferrous sulfate 325 (65 FE) MG tablet Take 325 mg by mouth 2 (two) times daily.   . furosemide (LASIX) 80 MG tablet Take 1 tablet by mouth as needed.  Marland Kitchen glucose blood test strip 1 each by Other route as needed.   . insulin glargine (LANTUS) 100 UNIT/ML injection Inject into the skin.  . Insulin Lispro, Human, (HUMALOG KWIKPEN [place]) Inject 20-25 Units into the skin 3 (three) times daily before meals.  Marland Kitchen LANTUS SOLOSTAR 100 UNIT/ML Solostar Pen   . Multiple Vitamin (MULTIVITAMIN WITH MINERALS) TABS tablet Take 1 tablet by mouth 2 (two) times daily.   . ONE TOUCH ULTRA TEST test strip   . sacubitril-valsartan (ENTRESTO) 24-26 MG Take 1 tablet by mouth 2 (two) times daily.  Marland Kitchen zolpidem (AMBIEN) 10 MG tablet Take 10 mg by mouth at bedtime as needed for sleep.      Allergies:   Aciphex [rabeprazole sodium]; Acyclovir and related; and Nsaids   Social History   Socioeconomic History  . Marital status: Single    Spouse name: Not on file  . Number of children: Not on file  . Years of education: Not on file  . Highest education level: Not on file  Occupational History  . Not  on file  Social Needs  . Financial resource strain: Not on file  . Food insecurity:    Worry: Not on file    Inability: Not on file  . Transportation needs:    Medical: Not on file    Non-medical: Not on file  Tobacco Use  . Smoking status: Never Smoker  . Smokeless tobacco: Never Used  Substance and Sexual Activity  . Alcohol use: Yes    Comment: RARELY  . Drug use: No  . Sexual activity: Not on file  Lifestyle  . Physical activity:    Days per week: Not on file    Minutes per session:  Not on file  . Stress: Not on file  Relationships  . Social connections:    Talks on phone: Not on file    Gets together: Not on file    Attends religious service: Not on file    Active member of club or organization: Not on file    Attends meetings of clubs or organizations: Not on file    Relationship status: Not on file  Other Topics Concern  . Not on file  Social History Narrative  . Not on file     Family History: The patient's family history is not on file. He was adopted.  ROS:   Please see the history of present illness.    All other systems reviewed and are negative.  EKGs/Labs/Other Studies Reviewed:    The following studies were reviewed today: I discussed my findings with the patient at extensive length   Recent Labs: 11/25/2017: ALT 17; Hemoglobin 13.7; Platelets 277; TSH 0.060 12/09/2017: BUN 23; Creatinine, Ser 2.05; Potassium 4.5; Sodium 139  Recent Lipid Panel    Component Value Date/Time   CHOL 184 11/25/2017 1645   TRIG 167 (H) 11/25/2017 1645   HDL 54 11/25/2017 1645   CHOLHDL 3.4 11/25/2017 1645   CHOLHDL 3.2 08/07/2013 0815   VLDL 19 08/07/2013 0815   LDLCALC 97 11/25/2017 1645    Physical Exam:    VS:  BP 118/72 (BP Location: Right Arm, Patient Position: Sitting, Cuff Size: Normal)   Pulse 83   Ht 6\' 1"  (1.854 m)   Wt (!) 369 lb (167.4 kg)   SpO2 98%   BMI 48.68 kg/m     Wt Readings from Last 3 Encounters:  01/30/18 (!) 369 lb (167.4 kg)    12/12/17 (!) 364 lb (165.1 kg)  11/25/17 (!) 364 lb (165.1 kg)     GEN: Patient is in no acute distress HEENT: Normal NECK: No JVD; No carotid bruits LYMPHATICS: No lymphadenopathy CARDIAC: Hear sounds regular, 2/6 systolic murmur at the apex. RESPIRATORY:  Clear to auscultation without rales, wheezing or rhonchi  ABDOMEN: Soft, non-tender, non-distended MUSCULOSKELETAL:  No edema; No deformity  SKIN: Warm and dry NEUROLOGIC:  Alert and oriented x 3 PSYCHIATRIC:  Normal affect   Signed, Jenean Lindau, MD  01/31/2018 8:25 AM    [place]

## 2018-02-07 DIAGNOSIS — G4733 Obstructive sleep apnea (adult) (pediatric): Secondary | ICD-10-CM | POA: Diagnosis not present

## 2018-02-09 ENCOUNTER — Ambulatory Visit: Payer: BLUE CROSS/BLUE SHIELD

## 2018-02-09 DIAGNOSIS — R002 Palpitations: Secondary | ICD-10-CM | POA: Diagnosis not present

## 2018-03-03 ENCOUNTER — Telehealth: Payer: Self-pay

## 2018-03-03 NOTE — Telephone Encounter (Signed)
Left voicemail for the patient to call the office regarding monitor results. 

## 2018-04-20 DIAGNOSIS — E1122 Type 2 diabetes mellitus with diabetic chronic kidney disease: Secondary | ICD-10-CM | POA: Diagnosis not present

## 2018-04-20 DIAGNOSIS — Z794 Long term (current) use of insulin: Secondary | ICD-10-CM | POA: Diagnosis not present

## 2018-04-20 DIAGNOSIS — N183 Chronic kidney disease, stage 3 (moderate): Secondary | ICD-10-CM | POA: Diagnosis not present

## 2018-04-20 DIAGNOSIS — I429 Cardiomyopathy, unspecified: Secondary | ICD-10-CM | POA: Diagnosis not present

## 2018-04-26 ENCOUNTER — Ambulatory Visit (HOSPITAL_BASED_OUTPATIENT_CLINIC_OR_DEPARTMENT_OTHER)
Admission: RE | Admit: 2018-04-26 | Discharge: 2018-04-26 | Disposition: A | Discharge status: Home / Self Care | Payer: BLUE CROSS/BLUE SHIELD | Source: Ambulatory Visit | Attending: Cardiology | Admitting: Cardiology

## 2018-04-26 DIAGNOSIS — I428 Other cardiomyopathies: Secondary | ICD-10-CM | POA: Diagnosis not present

## 2018-04-26 MED ORDER — PERFLUTREN LIPID MICROSPHERE
1.0000 mL | INTRAVENOUS | Status: AC | PRN
  Administered 2018-04-26: 4 mL via INTRAVENOUS
  Filled 2018-04-26: qty 10

## 2018-04-27 ENCOUNTER — Encounter: Payer: Self-pay | Admitting: Cardiology

## 2018-04-27 ENCOUNTER — Ambulatory Visit (INDEPENDENT_AMBULATORY_CARE_PROVIDER_SITE_OTHER): Payer: BLUE CROSS/BLUE SHIELD | Admitting: Cardiology

## 2018-04-27 VITALS — BP 122/72 | HR 83 | Ht 73.0 in | Wt 367.0 lb

## 2018-04-27 DIAGNOSIS — Z9884 Bariatric surgery status: Secondary | ICD-10-CM

## 2018-04-27 DIAGNOSIS — N183 Chronic kidney disease, stage 3 unspecified: Secondary | ICD-10-CM

## 2018-04-27 DIAGNOSIS — Z1329 Encounter for screening for other suspected endocrine disorder: Secondary | ICD-10-CM

## 2018-04-27 DIAGNOSIS — G4733 Obstructive sleep apnea (adult) (pediatric): Secondary | ICD-10-CM

## 2018-04-27 DIAGNOSIS — I429 Cardiomyopathy, unspecified: Secondary | ICD-10-CM

## 2018-04-27 DIAGNOSIS — C8330 Diffuse large B-cell lymphoma, unspecified site: Secondary | ICD-10-CM

## 2018-04-27 HISTORY — DX: Cardiomyopathy, unspecified: I42.9

## 2018-04-27 NOTE — Patient Instructions (Signed)
Medication Instructions:  Your physician recommends that you continue on your current medications as directed. Please refer to the Current Medication list given to you today.  If you need a refill on your cardiac medications before your next appointment, please call your pharmacy.   Lab work: Your physician recommends that you return for lab work today: BMP, TSH.   If you have labs (blood work) drawn today and your tests are completely normal, you will receive your results only by: Marland Kitchen MyChart Message (if you have MyChart) OR . A paper copy in the mail If you have any lab test that is abnormal or we need to change your treatment, we will call you to review the results.  Testing/Procedures: None  Follow-Up: At [facility], you and your health needs are our priority.  As part of our continuing mission to provide you with exceptional heart care, we have created designated Provider Care Teams.  These Care Teams include your primary Cardiologist (physician) and Advanced Practice Providers (APPs -  Physician Assistants and Nurse Practitioners) who all work together to provide you with the care you need, when you need it. You will need a follow up appointment in 6 months.  Please call our office 2 months in advance to schedule this appointment.

## 2018-04-27 NOTE — Progress Notes (Deleted)
Cardiology Office Note:    Date:  04/27/2018   ID:  [name] Sosa, DOB 20-Jun-1960, MRN [id number]  PCP:  [name] Orn, MD  Cardiologist:  [name] Lindau, MD   Referring MD: [name] Orn, MD    ASSESSMENT:    1. CKD (chronic kidney disease), stage III ([place])   2. History of Lap Roux-en-Y gastric bypass 03/05/13   3. Diffuse large cell lymphoma in remission ([place])   4. Morbid obesity ([place])   5. OSA (obstructive sleep apnea)   6. Cardiomyopathy, unspecified type ([place])    PLAN:    In order of problems listed above:  1. ***   Medication Adjustments/Labs and Tests Ordered: Current medicines are reviewed at length with the patient today.  Concerns regarding medicines are outlined above.  No orders of the defined types were placed in this encounter.  No orders of the defined types were placed in this encounter.    No chief complaint on file.    History of Present Illness:    [name] Sosa is a 58 y.o. male ***  Past Medical History:  Diagnosis Date  . Anemia   . Anxiety    HX OF ANXIETY ATTACKS--CAN'T SLEEP ON BACK-FEELS LIKE HE CAN'T BREATHE  . Apnea, sleep    SEVERE OSA PER STUDY 12/01/12  . Cancer ([place])    HX OF NON HODGKIN'S LYMPHOMA -IN REMISSION  . Cardiomyopathy, nonischemic ([place])    DX 2003 CARDIAC CATH, ECHO 2006 SHOWED EF IMPROVED TO 45-50%--PER CARDIOLOGY OFFICE NOTES DR. Linard Sosa FROM 01/20/10  . CHF (congestive heart failure) ([place]) 2003  . Chronic kidney disease    HX OF CHRONIC KIDNEY DISEASE STAGE III - EVALUATED AT [facility] IN THE PAST AND FELT TO BE RELATED TO NSAID'S AND CHEMO.  PT  STATES HE HAS NOT SEEN KIDNEY SPECIALIST AT [facility] IN OVER A YEAR.  Marland Kitchen Chronic systolic heart failure ([place])    CARDIOLOGIST IS DR. Linard Sosa  . Claustrophobia   . Diabetes mellitus   . GERD (gastroesophageal reflux disease)    NOT TAKING ANY MEDS FOR REFLUX  . Hyperlipidemia   . Hypertension   . Morbid obesity ([place])   . Multiple thyroid nodules    HX OF  NODULES=PT STATES BIOPSY-TOLD ALL OKAY  . Non-Hodgkin lymphoma ([place])    DX ABOUT 2007-TX'D WITH CHEMO-IN REMISSION  . Shortness of breath    WALKING UPSTAIRS    Past Surgical History:  Procedure Laterality Date  . COLONOSCOPY WITH PROPOFOL N/A 01/09/2013   Procedure: COLONOSCOPY WITH PROPOFOL;  Surgeon: [name] Fair, MD;  Location: [facility];  Service: Endoscopy;  Laterality: N/A;  . GASTRIC ROUX-EN-Y N/A 03/05/2013   Procedure: LAPAROSCOPIC ROUX-EN-Y GASTRIC BYPASS WITH UPPER ENDOSCOPY;  Surgeon: [name] Curry, MD;  Location: [facility];  Service: General;  Laterality: N/A;  . HYDROCELE EXCISION  02/09/2011   Procedure: HYDROCELECTOMY ADULT;  Surgeon: [name] Bonine, MD;  Location: [facility];  Service: Urology;  Laterality: Right;    Current Medications: Current Meds  Medication Sig  . acetaminophen (TYLENOL) 500 MG tablet Take 1,000 mg by mouth every 6 (six) hours as needed. pain  . ALPRAZolam (XANAX) 0.5 MG tablet Take 0.5 mg by mouth 2 (two) times daily as needed for anxiety.   Marland Kitchen aspirin 81 MG tablet Take 81 mg by mouth daily.  . Azelastine-Fluticasone (DYMISTA) 137-50 MCG/ACT SUSP Place 1 spray into the nose 2 (two) times daily.  . carvedilol (COREG) 25 MG tablet Take  0.5 tablets (12.5 mg total) by mouth 2 (two) times daily with a meal.  . ferrous sulfate 325 (65 FE) MG tablet Take 325 mg by mouth 2 (two) times daily.   . furosemide (LASIX) 80 MG tablet Take 1 tablet by mouth as needed.  Marland Kitchen glucose blood test strip 1 each by Other route as needed.   . insulin glargine (LANTUS) 100 UNIT/ML injection Inject into the skin.  . Insulin Lispro, Human, (HUMALOG KWIKPEN [place]) Inject 20-25 Units into the skin 3 (three) times daily before meals.  Marland Kitchen LANTUS SOLOSTAR 100 UNIT/ML Solostar Pen   . Multiple Vitamin (MULTIVITAMIN WITH MINERALS) TABS tablet Take 1 tablet by mouth 2 (two) times daily.   . ONE TOUCH ULTRA TEST test strip   . sacubitril-valsartan (ENTRESTO) 24-26 MG Take 1  tablet by mouth 2 (two) times daily.  Marland Kitchen zolpidem (AMBIEN) 10 MG tablet Take 10 mg by mouth at bedtime as needed for sleep.      Allergies:   Aciphex [rabeprazole sodium]; Acyclovir and related; and Nsaids   Social History   Socioeconomic History  . Marital status: Single    Spouse name: Not on file  . Number of children: Not on file  . Years of education: Not on file  . Highest education level: Not on file  Occupational History  . Not on file  Social Needs  . Financial resource strain: Not on file  . Food insecurity:    Worry: Not on file    Inability: Not on file  . Transportation needs:    Medical: Not on file    Non-medical: Not on file  Tobacco Use  . Smoking status: Never Smoker  . Smokeless tobacco: Never Used  Substance and Sexual Activity  . Alcohol use: Yes    Comment: RARELY  . Drug use: No  . Sexual activity: Not on file  Lifestyle  . Physical activity:    Days per week: Not on file    Minutes per session: Not on file  . Stress: Not on file  Relationships  . Social connections:    Talks on phone: Not on file    Gets together: Not on file    Attends religious service: Not on file    Active member of club or organization: Not on file    Attends meetings of clubs or organizations: Not on file    Relationship status: Not on file  Other Topics Concern  . Not on file  Social History Narrative  . Not on file     Family History: The patient's family history is not on file. He was adopted.  ROS:   Please see the history of present illness.    All other systems reviewed and are negative.  EKGs/Labs/Other Studies Reviewed:    The following studies were reviewed today: ***   Recent Labs: 11/25/2017: ALT 17; Hemoglobin 13.7; Platelets 277; TSH 0.060 12/09/2017: BUN 23; Creatinine, Ser 2.05; Potassium 4.5; Sodium 139  Recent Lipid Panel    Component Value Date/Time   CHOL 184 11/25/2017 1645   TRIG 167 (H) 11/25/2017 1645   HDL 54 11/25/2017 1645    CHOLHDL 3.4 11/25/2017 1645   CHOLHDL 3.2 08/07/2013 0815   VLDL 19 08/07/2013 0815   LDLCALC 97 11/25/2017 1645    Physical Exam:    VS:  BP 122/72 (BP Location: Right Arm, Patient Position: Sitting, Cuff Size: Normal)   Pulse 83   Ht 6\' 1"  (1.854 m)   Wt Marland Kitchen)  367 lb (166.5 kg)   SpO2 98%   BMI 48.42 kg/m     Wt Readings from Last 3 Encounters:  04/27/18 (!) 367 lb (166.5 kg)  01/30/18 (!) 369 lb (167.4 kg)  12/12/17 (!) 364 lb (165.1 kg)     GEN: Patient is in no acute distress HEENT: Normal NECK: No JVD; No carotid bruits LYMPHATICS: No lymphadenopathy CARDIAC: Hear sounds regular, 2/6 systolic murmur at the apex. RESPIRATORY:  Clear to auscultation without rales, wheezing or rhonchi  ABDOMEN: Soft, non-tender, non-distended MUSCULOSKELETAL:  No edema; No deformity  SKIN: Warm and dry NEUROLOGIC:  Alert and oriented x 3 PSYCHIATRIC:  Normal affect   Signed, [name] Lindau, MD  04/27/2018 8:35 AM    [place]

## 2018-04-27 NOTE — Progress Notes (Signed)
Cardiology Office Note:    Date:  04/27/2018   ID:  [name] Sosa, DOB 01-Dec-1960, MRN [id number]  PCP:  [name] Orn, MD  Cardiologist:  [name] Lindau, MD   Referring MD: [name] Orn, MD    ASSESSMENT:    1. CKD (chronic kidney disease), stage III ([name] Sosa)   2. History of Lap Roux-en-Y gastric bypass 03/05/13   3. Diffuse large cell lymphoma in remission ([name] Sosa)   4. Morbid obesity ([name] Sosa)   5. OSA (obstructive sleep apnea)   6. Cardiomyopathy, unspecified type ([name] Sosa)   7. Thyroid disorder screening    PLAN:    In order of problems listed above:  1. I discussed my findings with the patient at extensive length and primary prevention stressed.  Importance of compliance with diet and medication stressed and he vocalized understanding.  His blood pressure is stable.  Diet was discussed for dyslipidemia and diabetes mellitus.  Lipids are followed by his primary care physician.  In view of multiple medications that he is on he will have blood work today. 2. Cardiomyopathy we discussed congestive heart failure issues were discussed such as diet, salt intake and weighing himself regularly and he vocalized understanding.  I discussed issues about electrophysiology evaluation and possible defibrillator therapy but he is not keen on it.  I explained primary prevention of sudden cardiac death at length and he vocalized understanding and questions were answered to his satisfaction. 3. He will have blood work today as mentioned above. 4. He will be seen in follow-up appointment in 6 months or earlier if he has any concerns.  He will call us earlier if he has any concerns or questions or changes his mind about electrophysiology evaluation.  He confirmed to me that he takes his medications regularly.   Medication Adjustments/Labs and Tests Ordered: Current medicines are reviewed at length with the patient today.  Concerns regarding medicines are outlined above.  Orders Placed This Encounter    Procedures  . Basic Metabolic Panel (BMET)  . TSH   No orders of the defined types were placed in this encounter.    No chief complaint on file.    History of Present Illness:    [name] Sosa is a 58 y.o. male.  Patient has multiple comorbidities including but not limited to morbid obesity, essential hypertension, advanced cardiomyopathy with severely depressed left atrial systolic function, diabetes mellitus on insulin, dyslipidemia and significant renal insufficiency.  Patient is here for follow-up.  He had an echocardiogram yesterday which confirmed the above.  He denies any chest pain orthopnea or PND.  He says he walks 15 to 20 minutes twice a day without any problems.  At the time of my evaluation, the patient is alert awake oriented and in no distress.  Past Medical History:  Diagnosis Date  . Anemia   . Anxiety    HX OF ANXIETY ATTACKS--CAN'T SLEEP ON BACK-FEELS LIKE HE CAN'T BREATHE  . Apnea, sleep    SEVERE OSA PER STUDY 12/01/12  . Cancer ([place])    HX OF NON HODGKIN'S LYMPHOMA -IN REMISSION  . Cardiomyopathy, nonischemic ([name] Sosa)    DX 2003 CARDIAC CATH, ECHO 2006 SHOWED EF IMPROVED TO 45-50%--PER CARDIOLOGY OFFICE NOTES DR. Linard Sosa FROM 01/20/10  . CHF (congestive heart failure) ([name] Sosa) 2003  . Chronic kidney disease    HX OF CHRONIC KIDNEY DISEASE STAGE III - EVALUATED AT [name] IN THE PAST AND FELT TO BE RELATED TO NSAID'S AND CHEMO.  PT  STATES HE  HAS NOT SEEN KIDNEY SPECIALIST AT [name] IN OVER A YEAR.  Marland Kitchen Chronic systolic heart failure ([place])    CARDIOLOGIST IS DR. Linard Sosa  . Claustrophobia   . Diabetes mellitus   . GERD (gastroesophageal reflux disease)    NOT TAKING ANY MEDS FOR REFLUX  . Hyperlipidemia   . Hypertension   . Morbid obesity ([place])   . Multiple thyroid nodules    HX OF NODULES=PT STATES BIOPSY-TOLD ALL OKAY  . Non-Hodgkin lymphoma ([name] Sosa)    DX ABOUT 2007-TX'D WITH CHEMO-IN REMISSION  . Shortness of breath    WALKING UPSTAIRS    Past  Surgical History:  Procedure Laterality Date  . COLONOSCOPY WITH PROPOFOL N/A 01/09/2013   Procedure: COLONOSCOPY WITH PROPOFOL;  Surgeon: [name] Fair, MD;  Location: [name];  Service: Sosa;  Laterality: N/A;  . GASTRIC ROUX-EN-Y N/A 03/05/2013   Procedure: LAPAROSCOPIC ROUX-EN-Y GASTRIC BYPASS WITH UPPER Sosa;  Surgeon: [name] Curry, MD;  Location: [name];  Service: General;  Laterality: N/A;  . HYDROCELE EXCISION  02/09/2011   Procedure: HYDROCELECTOMY ADULT;  Surgeon: [name] Bonine, MD;  Location: [name];  Service: Urology;  Laterality: Right;    Current Medications: Current Meds  Medication Sig  . acetaminophen (TYLENOL) 500 MG tablet Take 1,000 mg by mouth every 6 (six) hours as needed. pain  . ALPRAZolam (XANAX) 0.5 MG tablet Take 0.5 mg by mouth 2 (two) times daily as needed for anxiety.   Marland Kitchen aspirin 81 MG tablet Take 81 mg by mouth daily.  . Azelastine-Fluticasone (DYMISTA) 137-50 MCG/ACT SUSP Place 1 spray into the nose 2 (two) times daily.  . carvedilol (COREG) 25 MG tablet Take 0.5 tablets (12.5 mg total) by mouth 2 (two) times daily with a meal.  . ferrous sulfate 325 (65 FE) MG tablet Take 325 mg by mouth 2 (two) times daily.   . furosemide (LASIX) 80 MG tablet Take 1 tablet by mouth as needed.  Marland Kitchen glucose blood test strip 1 each by Other route as needed.   . insulin glargine (LANTUS) 100 UNIT/ML injection Inject into the skin.  . Insulin Lispro, Human, (HUMALOG KWIKPEN [place]) Inject 20-25 Units into the skin 3 (three) times daily before meals.  Marland Kitchen LANTUS SOLOSTAR 100 UNIT/ML Solostar Pen   . Multiple Vitamin (MULTIVITAMIN WITH MINERALS) TABS tablet Take 1 tablet by mouth 2 (two) times daily.   . ONE TOUCH ULTRA TEST test strip   . sacubitril-valsartan (ENTRESTO) 24-26 MG Take 1 tablet by mouth 2 (two) times daily.  Marland Kitchen zolpidem (AMBIEN) 10 MG tablet Take 10 mg by mouth at bedtime as needed for sleep.      Allergies:   Aciphex [rabeprazole sodium];  Acyclovir and related; and Nsaids   Social History   Socioeconomic History  . Marital status: Single    Spouse name: Not on file  . Number of children: Not on file  . Years of education: Not on file  . Highest education level: Not on file  Occupational History  . Not on file  Social Needs  . Financial resource strain: Not on file  . Food insecurity:    Worry: Not on file    Inability: Not on file  . Transportation needs:    Medical: Not on file    Non-medical: Not on file  Tobacco Use  . Smoking status: Never Smoker  . Smokeless tobacco: Never Used  Substance and Sexual Activity  . Alcohol use: Yes    Comment: RARELY  .  Drug use: No  . Sexual activity: Not on file  Lifestyle  . Physical activity:    Days per week: Not on file    Minutes per session: Not on file  . Stress: Not on file  Relationships  . Social connections:    Talks on phone: Not on file    Gets together: Not on file    Attends religious service: Not on file    Active member of club or organization: Not on file    Attends meetings of clubs or organizations: Not on file    Relationship status: Not on file  Other Topics Concern  . Not on file  Social History Narrative  . Not on file     Family History: The patient's family history is not on file. He was adopted.  ROS:   Please see the history of present illness.    All other systems reviewed and are negative.  EKGs/Labs/Other Studies Reviewed:    The following studies were reviewed today: IMPRESSIONS    1. The left ventricle has severely reduced systolic function of 41-93%. The cavity size is moderately increased. There is no increased left ventricular wall thickness. Echo evidence of pseudonormalization in diastolic relaxation.  2. The pulmonic valve is not visualized. Pulmonic valve regurgitation was not assessed by color flow Doppler.  3. Moderately dilated LA.   Recent Labs: 11/25/2017: ALT 17; Hemoglobin 13.7; Platelets 277; TSH  0.060 12/09/2017: BUN 23; Creatinine, Ser 2.05; Potassium 4.5; Sodium 139  Recent Lipid Panel    Component Value Date/Time   CHOL 184 11/25/2017 1645   TRIG 167 (H) 11/25/2017 1645   HDL 54 11/25/2017 1645   CHOLHDL 3.4 11/25/2017 1645   CHOLHDL 3.2 08/07/2013 0815   VLDL 19 08/07/2013 0815   LDLCALC 97 11/25/2017 1645    Physical Exam:    VS:  BP 122/72 (BP Location: Right Arm, Patient Position: Sitting, Cuff Size: Normal)   Pulse 83   Ht 6\' 1"  (1.854 m)   Wt (!) 367 lb (166.5 kg)   SpO2 98%   BMI 48.42 kg/m     Wt Readings from Last 3 Encounters:  04/27/18 (!) 367 lb (166.5 kg)  01/30/18 (!) 369 lb (167.4 kg)  12/12/17 (!) 364 lb (165.1 kg)     GEN: Patient is in no acute distress HEENT: Normal NECK: No JVD; No carotid bruits LYMPHATICS: No lymphadenopathy CARDIAC: Hear sounds regular, 2/6 systolic murmur at the apex. RESPIRATORY:  Clear to auscultation without rales, wheezing or rhonchi  ABDOMEN: Soft, non-tender, non-distended MUSCULOSKELETAL:  No edema; No deformity  SKIN: Warm and dry NEUROLOGIC:  Alert and oriented x 3 PSYCHIATRIC:  Normal affect   Signed, [name] Lindau, MD  04/27/2018 8:39 AM    [place]

## 2018-04-28 ENCOUNTER — Encounter: Payer: Self-pay | Admitting: *Deleted

## 2018-04-28 LAB — BASIC METABOLIC PANEL
BUN/Creatinine Ratio: 12 (ref 9–20)
BUN: 22 mg/dL (ref 6–24)
CALCIUM: 9.5 mg/dL (ref 8.7–10.2)
CHLORIDE: 101 mmol/L (ref 96–106)
CO2: 26 mmol/L (ref 20–29)
Creatinine, Ser: 1.8 mg/dL — ABNORMAL HIGH (ref 0.76–1.27)
GFR calc non Af Amer: 40 mL/min/{1.73_m2} — ABNORMAL LOW (ref >59)
GFR, EST AFRICAN AMERICAN: 47 mL/min/{1.73_m2} — AB (ref >59)
Glucose: 123 mg/dL — ABNORMAL HIGH (ref 65–99)
POTASSIUM: 5.1 mmol/L (ref 3.5–5.2)
Sodium: 139 mmol/L (ref 134–144)

## 2018-04-28 LAB — TSH: TSH: 0.167 u[IU]/mL — ABNORMAL LOW (ref 0.450–4.500)

## 2018-05-15 DIAGNOSIS — G4733 Obstructive sleep apnea (adult) (pediatric): Secondary | ICD-10-CM | POA: Diagnosis not present

## 2018-08-23 DIAGNOSIS — G4733 Obstructive sleep apnea (adult) (pediatric): Secondary | ICD-10-CM | POA: Diagnosis not present

## 2018-08-25 DIAGNOSIS — E1122 Type 2 diabetes mellitus with diabetic chronic kidney disease: Secondary | ICD-10-CM | POA: Diagnosis not present

## 2018-08-25 DIAGNOSIS — I5020 Unspecified systolic (congestive) heart failure: Secondary | ICD-10-CM | POA: Diagnosis not present

## 2018-08-25 DIAGNOSIS — N183 Chronic kidney disease, stage 3 (moderate): Secondary | ICD-10-CM | POA: Diagnosis not present

## 2018-08-25 DIAGNOSIS — Z794 Long term (current) use of insulin: Secondary | ICD-10-CM | POA: Diagnosis not present

## 2018-08-31 DIAGNOSIS — E1122 Type 2 diabetes mellitus with diabetic chronic kidney disease: Secondary | ICD-10-CM | POA: Diagnosis not present

## 2018-09-20 DIAGNOSIS — E1122 Type 2 diabetes mellitus with diabetic chronic kidney disease: Secondary | ICD-10-CM | POA: Diagnosis not present

## 2018-09-25 DIAGNOSIS — E1122 Type 2 diabetes mellitus with diabetic chronic kidney disease: Secondary | ICD-10-CM | POA: Diagnosis not present

## 2018-12-04 ENCOUNTER — Other Ambulatory Visit: Payer: Self-pay | Admitting: Cardiology

## 2018-12-05 ENCOUNTER — Other Ambulatory Visit: Payer: Self-pay | Admitting: Cardiology

## 2018-12-05 MED ORDER — SACUBITRIL-VALSARTAN 24-26 MG PO TABS: 1.0000 | ORAL_TABLET | Freq: Two times a day (BID) | ORAL | 2 refills | Status: DC

## 2018-12-05 NOTE — Telephone Encounter (Signed)
Entresto 24/26 mg twice daily refilled.  

## 2018-12-05 NOTE — Telephone Encounter (Signed)
New Message    *STAT* If patient is at the pharmacy, call can be transferred to refill team.   1. Which medications need to be refilled? (please list name of each medication and dose if known) sacubitril-valsartan (ENTRESTO) 24-26 MG    2. Which pharmacy/location (including street and city if local pharmacy) is medication to be sent to? [id number] [place], [place] - [address].  3. Do they need a 30 day or 90 day supply? [place]

## 2018-12-21 DIAGNOSIS — G4733 Obstructive sleep apnea (adult) (pediatric): Secondary | ICD-10-CM | POA: Diagnosis not present

## 2018-12-29 ENCOUNTER — Telehealth: Payer: Self-pay | Admitting: Cardiology

## 2019-01-01 NOTE — Telephone Encounter (Signed)
Entresto refill sent, requires office visit for further refills

## 2019-02-08 DIAGNOSIS — H52203 Unspecified astigmatism, bilateral: Secondary | ICD-10-CM | POA: Diagnosis not present

## 2019-02-08 DIAGNOSIS — E119 Type 2 diabetes mellitus without complications: Secondary | ICD-10-CM | POA: Diagnosis not present

## 2019-02-13 NOTE — Telephone Encounter (Signed)
Please refill entresto for 90 days, patient has a follow up appt scheduled for 12/2

## 2019-02-14 MED ORDER — ENTRESTO 24-26 MG PO TABS: 1.0000 | ORAL_TABLET | Freq: Two times a day (BID) | ORAL | 0 refills | Status: DC

## 2019-02-14 NOTE — Telephone Encounter (Signed)
[facility] [address], [place].

## 2019-02-14 NOTE — Addendum Note (Signed)
Addended by: Polly Cobia A on: 02/14/2019 04:55 PM   Modules accepted: Orders

## 2019-02-21 ENCOUNTER — Encounter: Payer: Self-pay | Admitting: Cardiology

## 2019-02-21 ENCOUNTER — Other Ambulatory Visit: Payer: Self-pay

## 2019-02-21 ENCOUNTER — Ambulatory Visit (INDEPENDENT_AMBULATORY_CARE_PROVIDER_SITE_OTHER): Payer: BC Managed Care – PPO | Admitting: Cardiology

## 2019-02-21 VITALS — BP 126/58 | HR 97 | Ht 73.0 in | Wt 370.0 lb

## 2019-02-21 DIAGNOSIS — Z1329 Encounter for screening for other suspected endocrine disorder: Secondary | ICD-10-CM | POA: Diagnosis not present

## 2019-02-21 DIAGNOSIS — I428 Other cardiomyopathies: Secondary | ICD-10-CM

## 2019-02-21 DIAGNOSIS — Z9884 Bariatric surgery status: Secondary | ICD-10-CM

## 2019-02-21 DIAGNOSIS — Z13220 Encounter for screening for lipoid disorders: Secondary | ICD-10-CM | POA: Diagnosis not present

## 2019-02-21 DIAGNOSIS — I420 Dilated cardiomyopathy: Secondary | ICD-10-CM

## 2019-02-21 NOTE — Patient Instructions (Signed)
Medication Instructions:  Your physician recommends that you continue on your current medications as directed. Please refer to the Current Medication list given to you today.  *If you need a refill on your cardiac medications before your next appointment, please call your pharmacy*  Lab Work: Your physician recommends that you have a BMP, CBC, TSH, hepatic and lipid drawn today  If you have labs (blood work) drawn today and your tests are completely normal, you will receive your results only by: Marland Kitchen MyChart Message (if you have MyChart) OR . A paper copy in the mail If you have any lab test that is abnormal or we need to change your treatment, we will call you to review the results.  Testing/Procedures: You had an EKG performed today  Your physician has requested that you have an echocardiogram. Echocardiography is a painless test that uses sound waves to create images of your heart. It provides your doctor with information about the size and shape of your heart and how well your heart's chambers and valves are working. This procedure takes approximately one hour. There are no restrictions for this procedure.    Follow-Up: At [facility], you and your health needs are our priority.  As part of our continuing mission to provide you with exceptional heart care, we have created designated Provider Care Teams.  These Care Teams include your primary Cardiologist (physician) and Advanced Practice Providers (APPs -  Physician Assistants and Nurse Practitioners) who all work together to provide you with the care you need, when you need it.  Your next appointment:   2 month(s)  The format for your next appointment:   In Person  Provider:   Jyl Heinz, MD  Other Instructions  Echocardiogram An echocardiogram is a procedure that uses painless sound waves (ultrasound) to produce an image of the heart. Images from an echocardiogram can provide important information about:  Signs of  coronary artery disease (CAD).  Aneurysm detection. An aneurysm is a weak or damaged part of an artery wall that bulges out from the normal force of blood pumping through the body.  Heart size and shape. Changes in the size or shape of the heart can be associated with certain conditions, including heart failure, aneurysm, and CAD.  Heart muscle function.  Heart valve function.  Signs of a past heart attack.  Fluid buildup around the heart.  Thickening of the heart muscle.  A tumor or infectious growth around the heart valves. Tell a health care provider about:  Any allergies you have.  All medicines you are taking, including vitamins, herbs, eye drops, creams, and over-the-counter medicines.  Any blood disorders you have.  Any surgeries you have had.  Any medical conditions you have.  Whether you are pregnant or may be pregnant. What are the risks? Generally, this is a safe procedure. However, problems may occur, including:  Allergic reaction to dye (contrast) that may be used during the procedure. What happens before the procedure? No specific preparation is needed. You may eat and drink normally. What happens during the procedure?   An IV tube may be inserted into one of your veins.  You may receive contrast through this tube. A contrast is an injection that improves the quality of the pictures from your heart.  A gel will be applied to your chest.  A wand-like tool (transducer) will be moved over your chest. The gel will help to transmit the sound waves from the transducer.  The sound waves will harmlessly bounce  off of your heart to allow the heart images to be captured in real-time motion. The images will be recorded on a computer. The procedure may vary among health care providers and hospitals. What happens after the procedure?  You may return to your normal, everyday life, including diet, activities, and medicines, unless your health care provider tells you  not to do that. Summary  An echocardiogram is a procedure that uses painless sound waves (ultrasound) to produce an image of the heart.  Images from an echocardiogram can provide important information about the size and shape of your heart, heart muscle function, heart valve function, and fluid buildup around your heart.  You do not need to do anything to prepare before this procedure. You may eat and drink normally.  After the echocardiogram is completed, you may return to your normal, everyday life, unless your health care provider tells you not to do that. This information is not intended to replace advice given to you by your health care provider. Make sure you discuss any questions you have with your health care provider. Document Released: 03/05/2000 Document Revised: 06/29/2018 Document Reviewed: 04/10/2016 Elsevier Patient Education  2020 [employer].

## 2019-02-21 NOTE — Progress Notes (Signed)
Cardiology Office Note:    Date:  02/21/2019   ID:  [name] Sosa, DOB 1960-07-29, MRN [id number]  PCP:  Lavone Orn, MD  Cardiologist:  Jenean Lindau, MD   Referring MD: Lavone Orn, MD    ASSESSMENT:    1. Nonischemic cardiomyopathy (HCC)   2. Screening cholesterol level   3. Thyroid disorder screen   4. Dilated cardiomyopathy ([place])   5. Morbid obesity ([place])   6. History of Lap Roux-en-Y gastric bypass 03/05/13    PLAN:    In order of problems listed above:  1. Cardiomyopathy: I discussed my findings with the patient at extensive length.  He vocalized understanding.  Lifestyle modification was encouraged.  Diet was discussed for obesity.  Weight reduction was stressed.  Vocalized understanding and promises to his letter.  I would like to get an echocardiogram since he is on Entresto for an extended period of time.  Electrophysiology was revisited hypertension.  I respect his wishes.  Consequences including sudden cardiac death were discussed and questions were answered to his satisfaction.  He will have all blood work today as he is fasting.  He will be seen in follow-up appointment concerns.   Medication Adjustments/Labs and Tests Ordered: Current medicines are reviewed at length with the patient today.  Concerns regarding medicines are outlined above.  Orders Placed This Encounter  Procedures  . Basic Metabolic Panel (BMET)  . TSH  . CBC  . Hepatic function panel  . Lipid Profile  . EKG 12-Lead  . ECHOCARDIOGRAM COMPLETE   No orders of the defined types were placed in this encounter.    No chief complaint on file.    History of Present Illness:    [name] Sosa is a 58 y.o. male.  Patient has past medical history of cardiomyopathy, essential hypertension morbid obesity.  He denies any problems at this time and takes care of a decrease of daily living.  No chest pain orthopnea or PND.  He leads a sedentary lifestyle.  At the time of my evaluation, the  patient is alert awake oriented and in no distress.  Past Medical History:  Diagnosis Date  . Anemia   . Anxiety    HX OF ANXIETY ATTACKS--CAN'T SLEEP ON BACK-FEELS LIKE HE CAN'T BREATHE  . Apnea, sleep    SEVERE OSA PER STUDY 12/01/12  . Cancer ([place])    HX OF NON HODGKIN'S LYMPHOMA -IN REMISSION  . Cardiomyopathy, nonischemic ([place])    DX 2003 CARDIAC CATH, ECHO 2006 SHOWED EF IMPROVED TO 45-50%--PER CARDIOLOGY OFFICE NOTES DR. Linard Millers FROM 01/20/10  . CHF (congestive heart failure) ([place]) 2003  . Chronic kidney disease    HX OF CHRONIC KIDNEY DISEASE STAGE III - EVALUATED AT [facility] IN THE PAST AND FELT TO BE RELATED TO NSAID'S AND CHEMO.  PT  STATES HE HAS NOT SEEN KIDNEY SPECIALIST AT [facility] IN OVER A YEAR.  Marland Kitchen Chronic systolic heart failure ([place])    CARDIOLOGIST IS DR. Linard Millers  . Claustrophobia   . Diabetes mellitus   . GERD (gastroesophageal reflux disease)    NOT TAKING ANY MEDS FOR REFLUX  . Hyperlipidemia   . Hypertension   . Morbid obesity ([place])   . Multiple thyroid nodules    HX OF NODULES=PT STATES BIOPSY-TOLD ALL OKAY  . Non-Hodgkin lymphoma ([place])    DX ABOUT 2007-TX'D WITH CHEMO-IN REMISSION  . Shortness of breath    WALKING UPSTAIRS    Past Surgical History:  Procedure Laterality  Date  . COLONOSCOPY WITH PROPOFOL N/A 01/09/2013   Procedure: COLONOSCOPY WITH PROPOFOL;  Surgeon: Garlan Fair, MD;  [facility]: [facility];  Service: Endoscopy;  Laterality: N/A;  . GASTRIC ROUX-EN-Y N/A 03/05/2013   Procedure: LAPAROSCOPIC ROUX-EN-Y GASTRIC BYPASS WITH UPPER ENDOSCOPY;  Surgeon: Gayland Curry, MD;  Location: [facility];  Service: General;  Laterality: N/A;  . HYDROCELE EXCISION  02/09/2011   Procedure: HYDROCELECTOMY ADULT;  Surgeon: Fredricka Bonine, MD;  Location: [facility];  Service: Urology;  Laterality: Right;    Current Medications: Current Meds  Medication Sig  . acetaminophen (TYLENOL) 500 MG tablet Take 1,000 mg by mouth every 6 (six) hours as  needed. pain  . ALPRAZolam (XANAX) 0.5 MG tablet Take 0.5 mg by mouth 2 (two) times daily as needed for anxiety.   Marland Kitchen aspirin 81 MG tablet Take 81 mg by mouth daily.  . carvedilol (COREG) 25 MG tablet Take 0.5 tablets (12.5 mg total) by mouth 2 (two) times daily with a meal.  . ENTRESTO 24-26 MG Take 1 tablet by mouth 2 (two) times daily.  . ferrous sulfate 325 (65 FE) MG tablet Take 325 mg by mouth 2 (two) times daily.   . furosemide (LASIX) 80 MG tablet Take 1 tablet by mouth as needed.  Marland Kitchen glucose blood test strip 1 each by Other route as needed.   . insulin glargine (LANTUS) 100 UNIT/ML injection Inject into the skin.  . Insulin Lispro, Human, (HUMALOG KWIKPEN [place]) Inject 20-25 Units into the skin 3 (three) times daily before meals.  Marland Kitchen LANTUS SOLOSTAR 100 UNIT/ML Solostar Pen   . Multiple Vitamin (MULTIVITAMIN WITH MINERALS) TABS tablet Take 1 tablet by mouth 2 (two) times daily.   . ONE TOUCH ULTRA TEST test strip   . zolpidem (AMBIEN) 10 MG tablet Take 10 mg by mouth at bedtime as needed for sleep.      Allergies:   Aciphex [rabeprazole sodium], Acyclovir and related, and Nsaids   Social History   Socioeconomic History  . Marital status: Single    Spouse name: Not on file  . Number of children: Not on file  . Years of education: Not on file  . Highest education level: Not on file  Occupational History  . Not on file  Social Needs  . Financial resource strain: Not on file  . Food insecurity    Worry: Not on file    Inability: Not on file  . Transportation needs    Medical: Not on file    Non-medical: Not on file  Tobacco Use  . Smoking status: Never Smoker  . Smokeless tobacco: Never Used  Substance and Sexual Activity  . Alcohol use: Yes    Comment: RARELY  . Drug use: No  . Sexual activity: Not on file  Lifestyle  . Physical activity    Days per week: Not on file    Minutes per session: Not on file  . Stress: Not on file  Relationships  . Social Product manager on phone: Not on file    Gets together: Not on file    Attends religious service: Not on file    Active member of club or organization: Not on file    Attends meetings of clubs or organizations: Not on file    Relationship status: Not on file  Other Topics Concern  . Not on file  Social History Narrative  . Not on file     Family History: The  patient's family history is not on file. He was adopted.  ROS:   Please see the history of present illness.    All other systems reviewed and are negative.  EKGs/Labs/Other Studies Reviewed:    The following studies were reviewed today: IMPRESSIONS    1. The left ventricle has severely reduced systolic function of 0000000. The cavity size is moderately increased. There is no increased left ventricular wall thickness. Echo evidence of pseudonormalization in diastolic relaxation.  2. The pulmonic valve is not visualized. Pulmonic valve regurgitation was not assessed by color flow Doppler.  3. Moderately dilated LA.    Recent Labs: 04/27/2018: BUN 22; Creatinine, Ser 1.80; Potassium 5.1; Sodium 139; TSH 0.167  Recent Lipid Panel    Component Value Date/Time   CHOL 184 11/25/2017 1645   TRIG 167 (H) 11/25/2017 1645   HDL 54 11/25/2017 1645   CHOLHDL 3.4 11/25/2017 1645   CHOLHDL 3.2 08/07/2013 0815   VLDL 19 08/07/2013 0815   LDLCALC 97 11/25/2017 1645    Physical Exam:    VS:  BP (!) 126/58 (BP Location: Left Arm, Patient Position: Sitting, Cuff Size: Normal)   Pulse 97   Ht 6\' 1"  (1.854 m)   Wt (!) 370 lb (167.8 kg)   SpO2 (!) 82%   BMI 48.82 kg/m     Wt Readings from Last 3 Encounters:  02/21/19 (!) 370 lb (167.8 kg)  04/27/18 (!) 367 lb (166.5 kg)  01/30/18 (!) 369 lb (167.4 kg)     GEN: Patient is in no acute distress HEENT: Normal NECK: No JVD; No carotid bruits LYMPHATICS: No lymphadenopathy CARDIAC: Hear sounds regular, 2/6 systolic murmur at the apex. RESPIRATORY:  Clear to auscultation without rales,  wheezing or rhonchi  ABDOMEN: Soft, non-tender, non-distended MUSCULOSKELETAL:  No edema; No deformity  SKIN: Warm and dry NEUROLOGIC:  Alert and oriented x 3 PSYCHIATRIC:  Normal affect   Signed, Jenean Lindau, MD  02/21/2019 9:45 AM    [place]

## 2019-02-22 LAB — CBC
Hematocrit: 42.3 % (ref 37.5–51.0)
Hemoglobin: 13.6 g/dL (ref 13.0–17.7)
MCH: 26.9 pg (ref 26.6–33.0)
MCHC: 32.2 g/dL (ref 31.5–35.7)
MCV: 84 fL (ref 79–97)
Platelets: 256 x10E3/uL (ref 150–450)
RBC: 5.05 x10E6/uL (ref 4.14–5.80)
RDW: 13.9 % (ref 11.6–15.4)
WBC: 5.7 x10E3/uL (ref 3.4–10.8)

## 2019-02-22 LAB — LIPID PANEL
Chol/HDL Ratio: 2.9 ratio (ref 0.0–5.0)
Cholesterol, Total: 165 mg/dL (ref 100–199)
HDL: 57 mg/dL (ref >39)
LDL Chol Calc (NIH): 87 mg/dL (ref 0–99)
Triglycerides: 116 mg/dL (ref 0–149)
VLDL Cholesterol Cal: 21 mg/dL (ref 5–40)

## 2019-02-22 LAB — TSH: TSH: 0.36 u[IU]/mL — ABNORMAL LOW (ref 0.450–4.500)

## 2019-02-22 LAB — HEPATIC FUNCTION PANEL
ALT: 17 IU/L (ref 0–44)
AST: 20 IU/L (ref 0–40)
Albumin: 4 g/dL (ref 3.8–4.9)
Alkaline Phosphatase: 85 IU/L (ref 39–117)
Bilirubin Total: 0.3 mg/dL (ref 0.0–1.2)
Bilirubin, Direct: 0.12 mg/dL (ref 0.00–0.40)
Total Protein: 6.7 g/dL (ref 6.0–8.5)

## 2019-02-22 LAB — BASIC METABOLIC PANEL WITH GFR
BUN/Creatinine Ratio: 11 (ref 10–24)
BUN: 21 mg/dL (ref 8–27)
CO2: 25 mmol/L (ref 20–29)
Calcium: 9.5 mg/dL (ref 8.6–10.2)
Chloride: 102 mmol/L (ref 96–106)
Creatinine, Ser: 2 mg/dL — ABNORMAL HIGH (ref 0.76–1.27)
GFR calc Af Amer: 41 mL/min/1.73 — ABNORMAL LOW (ref >59)
GFR calc non Af Amer: 35 mL/min/1.73 — ABNORMAL LOW (ref >59)
Glucose: 80 mg/dL (ref 65–99)
Potassium: 5.1 mmol/L (ref 3.5–5.2)
Sodium: 139 mmol/L (ref 134–144)

## 2019-02-28 ENCOUNTER — Telehealth: Payer: Self-pay

## 2019-02-28 NOTE — Telephone Encounter (Signed)
Left message for patient to call office concerning lab results, copy sent to Dr. Laurann Montana

## 2019-02-28 NOTE — Telephone Encounter (Signed)
-----   Message from Jenean Lindau, MD sent at 02/22/2019  9:41 AM EST ----- Needs to see his primary care physician for abnormal kidney and thyroid testing.  Lipids are fine. Jenean Lindau, MD 02/22/2019 9:40 AM

## 2019-03-01 NOTE — Telephone Encounter (Signed)
Patient called back for results.

## 2019-03-02 ENCOUNTER — Telehealth: Payer: Self-pay | Admitting: Emergency Medicine

## 2019-03-02 NOTE — Telephone Encounter (Signed)
Patient informed of results and advised to see pcp for abnormal kindey labs and abnormal tsh. No further questions.

## 2019-03-02 NOTE — Telephone Encounter (Signed)
Left message for patient to return call.

## 2019-03-02 NOTE — Telephone Encounter (Signed)
Opened in error

## 2019-03-09 ENCOUNTER — Ambulatory Visit (HOSPITAL_BASED_OUTPATIENT_CLINIC_OR_DEPARTMENT_OTHER)
Admission: RE | Admit: 2019-03-09 | Discharge: 2019-03-09 | Disposition: A | Discharge status: Home / Self Care | Payer: BC Managed Care – PPO | Source: Ambulatory Visit | Attending: Cardiology | Admitting: Cardiology

## 2019-03-09 ENCOUNTER — Other Ambulatory Visit: Payer: Self-pay

## 2019-03-09 DIAGNOSIS — I428 Other cardiomyopathies: Secondary | ICD-10-CM | POA: Diagnosis present

## 2019-03-09 NOTE — Progress Notes (Signed)
  Echocardiogram 2D Echocardiogram has been performed.  [name] Sosa 03/09/2019, 9:01 AM

## 2019-03-13 ENCOUNTER — Telehealth: Payer: Self-pay

## 2019-03-13 DIAGNOSIS — R0989 Other specified symptoms and signs involving the circulatory and respiratory systems: Secondary | ICD-10-CM

## 2019-03-13 NOTE — Addendum Note (Signed)
Addended by: Beckey Rutter on: 03/13/2019 02:10 PM   Modules accepted: Orders

## 2019-03-13 NOTE — Telephone Encounter (Signed)
Left message for patient to call office for results. °

## 2019-03-13 NOTE — Telephone Encounter (Signed)
-----   Message from Jenean Lindau, MD sent at 03/13/2019 11:53 AM EST ----- Ejection fraction has not changed and is severely depressed.  We have talked about electrophysiology appointment in the past for defibrillator but he has not agreed.  Please run it by him again. Jenean Lindau, MD 03/13/2019 11:53 AM

## 2019-03-13 NOTE — Telephone Encounter (Signed)
Patient is ok with referral to Dr. Curt Bears, order placed for HP. Note routed to [facility]. [place] to call and schedule patient.

## 2019-04-08 NOTE — Progress Notes (Signed)
Electrophysiology Office Note   Date:  04/09/2019   ID:  [name] Sosa, DOB [birth year], MRN [id number]  PCP:  Lavone Orn, MD  Cardiologist:  Revankar Primary Electrophysiologist:  Will Meredith Leeds, MD    Chief Complaint: CHF   History of Present Illness: [name] Sosa is a 59 y.o. male who is being seen today for the evaluation of CHF at the request of Revankar, Reita Cliche, MD. Presenting today for electrophysiology evaluation.  He has a past history of nonischemic cardiomyopathy, hypertension, and morbid obesity.  He has had a cardiomyopathy for quite a few years.  Over the last year, his ejection fraction is dropped to 25 to 30%.  Today, he denies symptoms of palpitations, chest pain, shortness of breath, orthopnea, PND, lower extremity edema, claudication, dizziness, presyncope, syncope, bleeding, or neurologic sequela. The patient is tolerating medications without difficulties.    Past Medical History:  Diagnosis Date  . Anemia   . Anxiety    HX OF ANXIETY ATTACKS--CAN'T SLEEP ON BACK-FEELS LIKE HE CAN'T BREATHE  . Apnea, sleep    SEVERE OSA PER STUDY 12/01/12  . Cancer ([place])    HX OF NON HODGKIN'S LYMPHOMA -IN REMISSION  . Cardiomyopathy, nonischemic ([place])    DX 2003 CARDIAC CATH, ECHO 2006 SHOWED EF IMPROVED TO 45-50%--PER CARDIOLOGY OFFICE NOTES DR. Linard Millers FROM 01/20/10  . CHF (congestive heart failure) ([place]) 2003  . Chronic kidney disease    HX OF CHRONIC KIDNEY DISEASE STAGE III - EVALUATED AT [facility] IN THE PAST AND FELT TO BE RELATED TO NSAID'S AND CHEMO.  PT  STATES HE HAS NOT SEEN KIDNEY SPECIALIST AT [facility] IN OVER A YEAR.  Marland Kitchen Chronic systolic heart failure ([place])    CARDIOLOGIST IS DR. Linard Millers  . Claustrophobia   . Diabetes mellitus   . GERD (gastroesophageal reflux disease)    NOT TAKING ANY MEDS FOR REFLUX  . Hyperlipidemia   . Hypertension   . Morbid obesity ([place])   . Multiple thyroid nodules    HX OF NODULES=PT STATES BIOPSY-TOLD ALL OKAY   . Non-Hodgkin lymphoma ([place])    DX ABOUT 2007-TX'D WITH CHEMO-IN REMISSION  . Shortness of breath    WALKING UPSTAIRS   Past Surgical History:  Procedure Laterality Date  . COLONOSCOPY WITH PROPOFOL N/A 01/09/2013   Procedure: COLONOSCOPY WITH PROPOFOL;  Surgeon: Garlan Fair, MD;  [facility];  Service: Endoscopy;  Laterality: N/A;  . GASTRIC ROUX-EN-Y N/A 03/05/2013   Procedure: LAPAROSCOPIC ROUX-EN-Y GASTRIC BYPASS WITH UPPER ENDOSCOPY;  Surgeon: Gayland Curry, MD;  Location[facility];  Service: General;  Laterality: N/A;  . HYDROCELE EXCISION  02/09/2011   Procedure: HYDROCELECTOMY ADULT;  Surgeon: Fredricka Bonine, MD;  Location: [facility];  Service: Urology;  Laterality: Right;     Current Outpatient Medications  Medication Sig Dispense Refill  . acetaminophen (TYLENOL) 500 MG tablet Take 1,000 mg by mouth every 6 (six) hours as needed. pain    . ALPRAZolam (XANAX) 0.5 MG tablet Take 0.5 mg by mouth 2 (two) times daily as needed for anxiety.     . ferrous sulfate 325 (65 FE) MG tablet Take 325 mg by mouth 2 (two) times daily.     . furosemide (LASIX) 80 MG tablet Take 1 tablet by mouth as needed.    Marland Kitchen glucose blood test strip 1 each by Other route as needed.     . insulin glargine (LANTUS) 100 UNIT/ML injection Inject into the skin.    Marland Kitchen  Insulin Lispro, Human, (HUMALOG KWIKPEN [place]) Inject 20-25 Units into the skin 3 (three) times daily before meals.    Marland Kitchen LANTUS SOLOSTAR 100 UNIT/ML Solostar Pen     . Multiple Vitamin (MULTIVITAMIN WITH MINERALS) TABS tablet Take 1 tablet by mouth 2 (two) times daily.     . ONE TOUCH ULTRA TEST test strip     . zolpidem (AMBIEN) 10 MG tablet Take 10 mg by mouth at bedtime as needed for sleep.     Marland Kitchen aspirin EC 81 MG tablet Take 1 tablet (81 mg total) by mouth daily. 90 tablet 3  . carvedilol (COREG) 25 MG tablet Take 1 tablet (25 mg total) by mouth 2 (two) times daily. 180 tablet 1  . sacubitril-valsartan (ENTRESTO) 49-51 MG Take 1  tablet by mouth 2 (two) times daily. 60 tablet 6   No current facility-administered medications for this visit.    Allergies:   Aciphex [rabeprazole sodium], Acyclovir and related, and Nsaids   Social History:  The patient  reports that he has never smoked. He has never used smokeless tobacco. He reports current alcohol use. He reports that he does not use drugs.   Family History:  The patient's family history is not on file. He was adopted.    ROS:  Please see the history of present illness.   Otherwise, review of systems is positive for none.   All other systems are reviewed and negative.    PHYSICAL EXAM: VS:  BP 124/78   Pulse 85   Ht 6\' 1"  (1.854 m)   Wt (!) 371 lb (168.3 kg)   SpO2 98%   BMI 48.95 kg/m  , BMI Body mass index is 48.95 kg/m. GEN: Well nourished, well developed, in no acute distress  HEENT: normal  Neck: no JVD, carotid bruits, or masses Cardiac: RRR; no murmurs, rubs, or gallops,no edema  Respiratory:  clear to auscultation bilaterally, normal work of breathing GI: soft, nontender, nondistended, + BS MS: no deformity or atrophy  Skin: warm and dry Neuro:  Strength and sensation are intact Psych: euthymic mood, full affect  EKG:  EKG is not ordered today. Personal review of the ekg ordered 02/21/19 shows sinus rhythm, rate 82  Recent Labs: 02/21/2019: ALT 17; BUN 21; Creatinine, Ser 2.00; Hemoglobin 13.6; Platelets 256; Potassium 5.1; Sodium 139; TSH 0.360    Lipid Panel     Component Value Date/Time   CHOL 165 02/21/2019 0913   TRIG 116 02/21/2019 0913   HDL 57 02/21/2019 0913   CHOLHDL 2.9 02/21/2019 0913   CHOLHDL 3.2 08/07/2013 0815   VLDL 19 08/07/2013 0815   LDLCALC 87 02/21/2019 0913     Wt Readings from Last 3 Encounters:  04/09/19 (!) 371 lb (168.3 kg)  02/21/19 (!) 370 lb (167.8 kg)  04/27/18 (!) 367 lb (166.5 kg)      Other studies Reviewed: Additional studies/ records that were reviewed today include: TTE 03/09/19  Review  of the above records today demonstrates:   1. Left ventricular ejection fraction, by visual estimation, is 25 to 30%. The left ventricle has severely decreased function. Left ventricular septal wall thickness was mildly increased. Mildly increased left ventricular posterior wall thickness. There  is no left ventricular hypertrophy.  2. Elevated left atrial pressure.  3. Left ventricular diastolic parameters are consistent with Grade II diastolic dysfunction (pseudonormalization).  4. Moderately dilated left ventricular internal cavity size.  5. The left ventricle demonstrates global hypokinesis.  6. Global right ventricle has mildly  reduced systolic function.The right ventricular size is mildly enlarged. No increase in right ventricular wall thickness.  7. Left atrial size was normal.  8. Right atrial size was normal.  9. The mitral valve is normal in structure. Mild mitral valve regurgitation. No evidence of mitral stenosis. 10. The tricuspid valve is normal in structure. Tricuspid valve regurgitation is not demonstrated. 11. The aortic valve is tricuspid. Aortic valve regurgitation is not visualized. No evidence of aortic valve sclerosis or stenosis. 12. The pulmonic valve was normal in structure. Pulmonic valve regurgitation is not visualized.   ASSESSMENT AND PLAN:  1.  Nonischemic cardiomyopathy: Currently on optimal medical therapy with carvedilol, Entresto.  He is hesitant to implant an ICD at this time.  He would prefer to work with medical management to see if his ejection fraction improves.  Due to that, we will increase his Entresto and his carvedilol and repeat his echo in 6 months.  At that time, we will have further ICD discussions.  Case discussed with primary cardiologist  Current medicines are reviewed at length with the patient today.   The patient does not have concerns regarding his medicines.  The following changes were made today: Increase Entresto, carvedilol  Labs/  tests ordered today include:  Orders Placed This Encounter  Procedures  . ECHOCARDIOGRAM COMPLETE     Disposition:   FU with Will Camnitz 6 months  Signed, Will Meredith Leeds, MD  04/09/2019 3:11 PM     [place] [id number] [phone] (office) [phone] (fax)

## 2019-04-09 ENCOUNTER — Ambulatory Visit (INDEPENDENT_AMBULATORY_CARE_PROVIDER_SITE_OTHER): Payer: PRIVATE HEALTH INSURANCE | Admitting: Cardiology

## 2019-04-09 ENCOUNTER — Encounter: Payer: Self-pay | Admitting: Cardiology

## 2019-04-09 ENCOUNTER — Other Ambulatory Visit: Payer: Self-pay

## 2019-04-09 VITALS — BP 124/78 | HR 85 | Ht 73.0 in | Wt 371.0 lb

## 2019-04-09 DIAGNOSIS — I428 Other cardiomyopathies: Secondary | ICD-10-CM | POA: Diagnosis not present

## 2019-04-09 MED ORDER — ASPIRIN EC 81 MG PO TBEC: 81.0000 mg | DELAYED_RELEASE_TABLET | Freq: Every day | ORAL | 3 refills | Status: DC

## 2019-04-09 MED ORDER — CARVEDILOL 25 MG PO TABS: 25.0000 mg | ORAL_TABLET | Freq: Two times a day (BID) | ORAL | 1 refills | Status: DC

## 2019-04-09 MED ORDER — ENTRESTO 49-51 MG PO TABS: 1.0000 | ORAL_TABLET | Freq: Two times a day (BID) | ORAL | 6 refills | Status: DC

## 2019-04-09 NOTE — Patient Instructions (Addendum)
Medication Instructions:  Your physician has recommended you make the following change in your medication: 1. INCREASE Carvedilol to 25 mg twice a day 2. INCREASE Entresto to 49/51 mg twice a day 3. DECREASE Aspirin to 81 mg once a day  * If you need a refill on your cardiac medications before your next appointment, please call your pharmacy.   Labwork: You will need to have follow up lab work after [employer] increase.  You will have this completed at your appointment with Dr. Geraldo Pitter next month.  If you have labs (blood work) drawn today and your tests are completely normal, you will receive your results only by:  [place] (if you have MyChart) OR  A paper copy in the mail If you have any lab test that is abnormal or we need to change your treatment, we will call you to review the results.  Testing/Procedures: Your physician has requested that you have an echocardiogram in 6 months (prior to your follow up with Dr. Curt Bears.). Echocardiography is a painless test that uses sound waves to create images of your heart. It provides your doctor with information about the size and shape of your heart and how well your heart's chambers and valves are working. This procedure takes approximately one hour. There are no restrictions for this procedure.  Follow-Up: Your physician wants you to follow-up in: 6 months with Dr. Curt Bears (after you have completed your echocardiogram).  You will receive a reminder letter in the mail two months in advance. If you don't receive a letter, please call our office to schedule the follow-up appointment.   Thank you for choosing [facility]!!   Trinidad Curet, RN [phone]

## 2019-05-08 ENCOUNTER — Ambulatory Visit: Payer: Commercial Managed Care - PPO | Admitting: Cardiology

## 2019-05-31 ENCOUNTER — Ambulatory Visit: Payer: PRIVATE HEALTH INSURANCE | Attending: Internal Medicine

## 2019-05-31 DIAGNOSIS — Z23 Encounter for immunization: Secondary | ICD-10-CM

## 2019-05-31 NOTE — Progress Notes (Signed)
   Covid-19 Vaccination Clinic  Name:  [name] Sosa    MRN: [id number] DOB: 1960-07-24  05/31/2019  Mr. [name] was observed post Covid-19 immunization for 15 minutes without incident. He was provided with Vaccine Information Sheet and instruction to access the V-Safe system.   Mr. [name] was instructed to call 911 with any severe reactions post vaccine: Marland Kitchen Difficulty breathing  . Swelling of face and throat  . A fast heartbeat  . A bad rash all over body  . Dizziness and weakness   Immunizations Administered    Name Date Dose VIS Date Route   Moderna COVID-19 Vaccine 05/31/2019  9:45 AM 0.5 mL 02/20/2019 Intramuscular   Manufacturer: Moderna   Lot: [id number]   [id number]

## 2019-06-05 ENCOUNTER — Ambulatory Visit: Payer: Commercial Managed Care - PPO | Admitting: Cardiology

## 2019-06-26 ENCOUNTER — Ambulatory Visit: Payer: PRIVATE HEALTH INSURANCE | Admitting: Cardiology

## 2019-07-04 ENCOUNTER — Ambulatory Visit: Payer: PRIVATE HEALTH INSURANCE

## 2019-07-23 ENCOUNTER — Encounter: Payer: Self-pay | Admitting: Cardiology

## 2019-07-23 ENCOUNTER — Other Ambulatory Visit: Payer: Self-pay

## 2019-07-23 ENCOUNTER — Ambulatory Visit (INDEPENDENT_AMBULATORY_CARE_PROVIDER_SITE_OTHER): Payer: PRIVATE HEALTH INSURANCE | Admitting: Cardiology

## 2019-07-23 VITALS — BP 108/80 | HR 90 | Ht 73.0 in | Wt 369.0 lb

## 2019-07-23 DIAGNOSIS — I428 Other cardiomyopathies: Secondary | ICD-10-CM | POA: Diagnosis not present

## 2019-07-23 DIAGNOSIS — G4733 Obstructive sleep apnea (adult) (pediatric): Secondary | ICD-10-CM

## 2019-07-23 DIAGNOSIS — N183 Chronic kidney disease, stage 3 unspecified: Secondary | ICD-10-CM

## 2019-07-23 NOTE — Progress Notes (Signed)
Cardiology Office Note:    Date:  07/23/2019   ID:  [name] Sosa, DOB 16-Mar-1961, MRN [id number]  PCP:  Lavone Orn, MD  Cardiologist:  Jenean Lindau, MD   Referring MD: Lavone Orn, MD    ASSESSMENT:    1. Nonischemic cardiomyopathy (HCC)   2. Stage 3 chronic kidney disease, unspecified whether stage 3a or 3b CKD   3. OSA (obstructive sleep apnea)    PLAN:    In order of problems listed above:  1. Primary prevention stressed with the patient. Importance of compliance with diet medication stressed and he vocalized understanding. 2. Advanced cardiomyopathy: Echocardiogram follow-up is pending. I discussed with him about these issues and again discussed electrophysiology evaluation and defibrillator but he is vehemently against it. He wants to continue current medications and again benefits and risks explained as before. He understood. 3. Essential hypertension: Blood pressure stable and patient is on multiple medications and we will have a Chem-7 today. 4. Morbid obesity: Diet was discussed weight reduction was stressed risks of obesity explained 5. Chronic kidney disease: We will have a Chem-7 done today and this will help [place] assess stability of renal function. 6. Patient will be seen in follow-up appointment in 4 months or earlier if the patient has any concerns    Medication Adjustments/Labs and Tests Ordered: Current medicines are reviewed at length with the patient today.  Concerns regarding medicines are outlined above.  Orders Placed This Encounter  Procedures  . Basic metabolic panel   No orders of the defined types were placed in this encounter.    No chief complaint on file.    History of Present Illness:    [name] Sosa is a 59 y.o. male. Patient has past medical history of essential hypertension morbid obesity and advanced cardiomyopathy. He is on guideline directed medical therapy. He denies any problems at this time and takes care of  activities of daily living. No chest pain orthopnea or PND. At the time of my evaluation, the patient is alert awake oriented and in no distress.  Past Medical History:  Diagnosis Date  . Anemia   . Anxiety    HX OF ANXIETY ATTACKS--CAN'T SLEEP ON BACK-FEELS LIKE HE CAN'T BREATHE  . Apnea, sleep    SEVERE OSA PER STUDY 12/01/12  . Cancer ([place])    HX OF NON HODGKIN'S LYMPHOMA -IN REMISSION  . Cardiomyopathy, nonischemic ([place])    DX 2003 CARDIAC CATH, ECHO 2006 SHOWED EF IMPROVED TO 45-50%--PER CARDIOLOGY OFFICE NOTES DR. Linard Millers FROM 01/20/10  . CHF (congestive heart failure) ([place]) 2003  . Chronic kidney disease    HX OF CHRONIC KIDNEY DISEASE STAGE III - EVALUATED AT [facility] IN THE PAST AND FELT TO BE RELATED TO NSAID'S AND CHEMO.  PT  STATES HE HAS NOT SEEN KIDNEY SPECIALIST AT [facility] IN OVER A YEAR.  Marland Kitchen Chronic systolic heart failure ([place])    CARDIOLOGIST IS DR. Linard Millers  . Claustrophobia   . Diabetes mellitus   . GERD (gastroesophageal reflux disease)    NOT TAKING ANY MEDS FOR REFLUX  . Hyperlipidemia   . Hypertension   . Morbid obesity ([place])   . Multiple thyroid nodules    HX OF NODULES=PT STATES BIOPSY-TOLD ALL OKAY  . Non-Hodgkin lymphoma ([place])    DX ABOUT 2007-TX'D WITH CHEMO-IN REMISSION  . Shortness of breath    WALKING UPSTAIRS    Past Surgical History:  Procedure Laterality Date  . COLONOSCOPY WITH PROPOFOL N/A 01/09/2013  Procedure: COLONOSCOPY WITH PROPOFOL;  Surgeon: Garlan Fair, MD;  Location: [facility];  Service: Endoscopy;  Laterality: N/A;  . GASTRIC ROUX-EN-Y N/A 03/05/2013   Procedure: LAPAROSCOPIC ROUX-EN-Y GASTRIC BYPASS WITH UPPER ENDOSCOPY;  Surgeon: Gayland Curry, MD;  Location: [facility];  Service: General;  Laterality: N/A;  . HYDROCELE EXCISION  02/09/2011   Procedure: HYDROCELECTOMY ADULT;  Surgeon: Fredricka Bonine, MD;  Location: [facility];  Service: Urology;  Laterality: Right;    Current Medications: Current Meds  Medication Sig    . acetaminophen (TYLENOL) 500 MG tablet Take 1,000 mg by mouth every 6 (six) hours as needed. pain  . ALPRAZolam (XANAX) 0.5 MG tablet Take 0.5 mg by mouth 2 (two) times daily as needed for anxiety.   Marland Kitchen aspirin EC 81 MG tablet Take 1 tablet (81 mg total) by mouth daily.  . carvedilol (COREG) 25 MG tablet Take 1 tablet (25 mg total) by mouth 2 (two) times daily.  . ferrous sulfate 325 (65 FE) MG tablet Take 325 mg by mouth 2 (two) times daily.   . furosemide (LASIX) 80 MG tablet Take 1 tablet by mouth as needed.  Marland Kitchen glucose blood test strip 1 each by Other route as needed.   . insulin glargine (LANTUS) 100 UNIT/ML injection Inject into the skin.  . Insulin Lispro, Human, (HUMALOG KWIKPEN [place]) Inject 20-25 Units into the skin 3 (three) times daily before meals.  Marland Kitchen LANTUS SOLOSTAR 100 UNIT/ML Solostar Pen   . Multiple Vitamin (MULTIVITAMIN WITH MINERALS) TABS tablet Take 1 tablet by mouth 2 (two) times daily.   . ONE TOUCH ULTRA TEST test strip   . sacubitril-valsartan (ENTRESTO) 49-51 MG Take 1 tablet by mouth 2 (two) times daily.  Marland Kitchen zolpidem (AMBIEN) 10 MG tablet Take 10 mg by mouth at bedtime as needed for sleep.      Allergies:   Aciphex [rabeprazole sodium], Acyclovir and related, and Nsaids   Social History   Socioeconomic History  . Marital status: Single    Spouse name: Not on file  . Number of children: Not on file  . Years of education: Not on file  . Highest education level: Not on file  Occupational History  . Not on file  Tobacco Use  . Smoking status: Never Smoker  . Smokeless tobacco: Never Used  Substance and Sexual Activity  . Alcohol use: Yes    Comment: RARELY  . Drug use: No  . Sexual activity: Not on file  Other Topics Concern  . Not on file  Social History Narrative  . Not on file   Social Determinants of Health   Financial Resource Strain:   . Difficulty of Paying Living Expenses:   Food Insecurity:   . Worried About Charity fundraiser in the Last  Year:   . Arboriculturist in the Last Year:   Transportation Needs:   . Film/video editor (Medical):   Marland Kitchen Lack of Transportation (Non-Medical):   Physical Activity:   . Days of Exercise per Week:   . Minutes of Exercise per Session:   Stress:   . Feeling of Stress :   Social Connections:   . Frequency of Communication with Friends and Family:   . Frequency of Social Gatherings with Friends and Family:   . Attends Religious Services:   . Active Member of Clubs or Organizations:   . Attends Archivist Meetings:   Marland Kitchen Marital Status:      Family History:  The patient's family history is not on file. He was adopted.  ROS:   Please see the history of present illness.    All other systems reviewed and are negative.  EKGs/Labs/Other Studies Reviewed:    The following studies were reviewed today: I discussed my findings with the patient at length.   Recent Labs: 02/21/2019: ALT 17; BUN 21; Creatinine, Ser 2.00; Hemoglobin 13.6; Platelets 256; Potassium 5.1; Sodium 139; TSH 0.360  Recent Lipid Panel    Component Value Date/Time   CHOL 165 02/21/2019 0913   TRIG 116 02/21/2019 0913   HDL 57 02/21/2019 0913   CHOLHDL 2.9 02/21/2019 0913   CHOLHDL 3.2 08/07/2013 0815   VLDL 19 08/07/2013 0815   LDLCALC 87 02/21/2019 0913    Physical Exam:    VS:  BP 108/80   Pulse 90   Ht 6\' 1"  (1.854 m)   Wt (!) 369 lb (167.4 kg)   SpO2 97%   BMI 48.68 kg/m     Wt Readings from Last 3 Encounters:  07/23/19 (!) 369 lb (167.4 kg)  04/09/19 (!) 371 lb (168.3 kg)  02/21/19 (!) 370 lb (167.8 kg)     GEN: Patient is in no acute distress HEENT: Normal NECK: No JVD; No carotid bruits LYMPHATICS: No lymphadenopathy CARDIAC: Hear sounds regular, 2/6 systolic murmur at the apex. RESPIRATORY:  Clear to auscultation without rales, wheezing or rhonchi  ABDOMEN: Soft, non-tender, non-distended MUSCULOSKELETAL:  No edema; No deformity  SKIN: Warm and dry NEUROLOGIC:  Alert and  oriented x 3 PSYCHIATRIC:  Normal affect   Signed, Jenean Lindau, MD  07/23/2019 5:06 PM    [place]

## 2019-07-23 NOTE — Patient Instructions (Signed)
Medication Instructions:  No medication changes. *If you need a refill on your cardiac medications before your next appointment, please call your pharmacy*   Lab Work: Your physician recommends that you have a BMET today in the office.  If you have labs (blood work) drawn today and your tests are completely normal, you will receive your results only by: . MyChart Message (if you have MyChart) OR . A paper copy in the mail If you have any lab test that is abnormal or we need to change your treatment, we will call you to review the results.   Testing/Procedures: None ordered   Follow-Up: At [facility], you and your health needs are our priority.  As part of our continuing mission to provide you with exceptional heart care, we have created designated Provider Care Teams.  These Care Teams include your primary Cardiologist (physician) and Advanced Practice Providers (APPs -  Physician Assistants and Nurse Practitioners) who all work together to provide you with the care you need, when you need it.  We recommend signing up for the patient portal called "MyChart".  Sign up information is provided on this After Visit Summary.  MyChart is used to connect with patients for Virtual Visits (Telemedicine).  Patients are able to view lab/test results, encounter notes, upcoming appointments, etc.  Non-urgent messages can be sent to your provider as well.   To learn more about what you can do with MyChart, go to [email or web address].    Your next appointment:   4 month(s)  The format for your next appointment:   In Person  Provider:   Rajan Revankar, MD   Other Instructions NA  

## 2019-07-24 ENCOUNTER — Telehealth: Payer: Self-pay

## 2019-07-24 LAB — BASIC METABOLIC PANEL
BUN/Creatinine Ratio: 10 (ref 10–24)
BUN: 21 mg/dL (ref 8–27)
CO2: 22 mmol/L (ref 20–29)
Calcium: 9.2 mg/dL (ref 8.6–10.2)
Chloride: 107 mmol/L — ABNORMAL HIGH (ref 96–106)
Creatinine, Ser: 2.08 mg/dL — ABNORMAL HIGH (ref 0.76–1.27)
GFR calc Af Amer: 39 mL/min/{1.73_m2} — ABNORMAL LOW (ref >59)
GFR calc non Af Amer: 34 mL/min/{1.73_m2} — ABNORMAL LOW (ref >59)
Glucose: 164 mg/dL — ABNORMAL HIGH (ref 65–99)
Potassium: 4.5 mmol/L (ref 3.5–5.2)
Sodium: 139 mmol/L (ref 134–144)

## 2019-07-24 NOTE — Telephone Encounter (Signed)
Spoke with patient regarding results and recommendation.  Patient verbalizes understanding and is agreeable to plan of care. Advised patient to call back with any issues or concerns.  

## 2019-07-24 NOTE — Telephone Encounter (Signed)
Left message on patients voicemail to please return our call.   

## 2019-07-24 NOTE — Telephone Encounter (Signed)
-----   Message from Jenean Lindau, MD sent at 07/24/2019  8:12 AM EDT ----- The results of the study is unremarkable. Please inform patient. I will discuss in detail at next appointment. Cc  primary care/referring physician Jenean Lindau, MD 07/24/2019 8:12 AM

## 2019-08-03 ENCOUNTER — Telehealth: Payer: Self-pay

## 2019-08-03 NOTE — Telephone Encounter (Signed)
    [facility] Pre-operative Risk Assessment    HEARTCARE STAFF: - Please ensure there is not already an duplicate clearance open for this procedure - Under Visit Info/Reason for Call, type in Other and utilize the format Clearance MM/DD/YY or Clearance TBD  Request for surgical clearance:  1. What type of surgery is being performed?colonoscopy   2. When is this surgery scheduled? June 2021  3. What type of clearance is required (medical clearance vs. Pharmacy clearance to hold med vs. Both)? Medical  4. Are there any medications that need to be held prior to surgery and how long? None   5. Practice name and name of physician performing surgery? [facility] Dr. Alessandra Bevels   6. What is the office phone number? [phone]   7.   What is the office fax number? [phone]  8.   Anesthesia type (None, local, MAC, general) ? Propofol   [name] Sosa 08/03/2019, 4:26 PM  _________________________________________________________________   (provider comments below)

## 2019-08-06 NOTE — Telephone Encounter (Signed)
I did not see the patient from a preoperative standpoint however the patient did not mention any symptoms.  His lung disease effort tolerance is I think he is not at high risk  Because of his multiple comorbidities including severely reduced ejection fraction, essential hypertension diabetes mellitus cardiomyopathy and obesity and renal insufficiency he remains at moderate risk.  Please do not hesitate to call us with any questions in his cardiovascular management.  If a procedure is planned meticulous hemodynamic monitoring should be pursued.

## 2019-08-06 NOTE — Telephone Encounter (Signed)
   Primary Cardiologist: Jenean Lindau, MD  Chart reviewed as part of pre-operative protocol coverage. He was doing well on cardiac stand point when recently seen by Dr. Geraldo Pitter 07/23/19.   Dr. Geraldo Pitter, note mention that will update echo however it scheduled in August. Does he need echo prior to clearance? Please give your recommendation for colonoscopy clearance.   Please forward your response to P CV DIV PREOP.   Thank you  Leanor Kail, PA 08/06/2019, 8:46 AM

## 2019-08-15 ENCOUNTER — Other Ambulatory Visit: Payer: Self-pay | Admitting: Gastroenterology

## 2019-09-21 ENCOUNTER — Other Ambulatory Visit (HOSPITAL_COMMUNITY)
Admission: RE | Admit: 2019-09-21 | Discharge: 2019-09-21 | Disposition: A | Discharge status: Home / Self Care | Payer: No Typology Code available for payment source | Source: Ambulatory Visit | Attending: Gastroenterology | Admitting: Gastroenterology

## 2019-09-21 DIAGNOSIS — Z01812 Encounter for preprocedural laboratory examination: Secondary | ICD-10-CM | POA: Insufficient documentation

## 2019-09-21 DIAGNOSIS — Z20822 Contact with and (suspected) exposure to covid-19: Secondary | ICD-10-CM | POA: Diagnosis not present

## 2019-09-21 LAB — SARS CORONAVIRUS 2 (TAT 6-24 HRS): SARS Coronavirus 2: NEGATIVE

## 2019-09-25 ENCOUNTER — Ambulatory Visit (HOSPITAL_COMMUNITY): Payer: No Typology Code available for payment source | Admitting: Certified Registered"

## 2019-09-25 ENCOUNTER — Ambulatory Visit (HOSPITAL_COMMUNITY)
Admission: RE | Admit: 2019-09-25 | Discharge: 2019-09-25 | Disposition: A | Discharge status: Home / Self Care | Payer: No Typology Code available for payment source | Attending: Gastroenterology | Admitting: Gastroenterology

## 2019-09-25 ENCOUNTER — Encounter (HOSPITAL_COMMUNITY)
Admission: RE | Disposition: A | Discharge status: Home / Self Care | Payer: Self-pay | Source: Home / Self Care | Attending: Gastroenterology

## 2019-09-25 ENCOUNTER — Encounter (HOSPITAL_COMMUNITY): Payer: Self-pay | Admitting: Gastroenterology

## 2019-09-25 ENCOUNTER — Other Ambulatory Visit: Payer: Self-pay

## 2019-09-25 DIAGNOSIS — N183 Chronic kidney disease, stage 3 unspecified: Secondary | ICD-10-CM | POA: Insufficient documentation

## 2019-09-25 DIAGNOSIS — Z1211 Encounter for screening for malignant neoplasm of colon: Secondary | ICD-10-CM | POA: Insufficient documentation

## 2019-09-25 DIAGNOSIS — K648 Other hemorrhoids: Secondary | ICD-10-CM | POA: Insufficient documentation

## 2019-09-25 DIAGNOSIS — Z86010 Personal history of colonic polyps: Secondary | ICD-10-CM | POA: Diagnosis not present

## 2019-09-25 DIAGNOSIS — I130 Hypertensive heart and chronic kidney disease with heart failure and stage 1 through stage 4 chronic kidney disease, or unspecified chronic kidney disease: Secondary | ICD-10-CM | POA: Diagnosis not present

## 2019-09-25 DIAGNOSIS — K219 Gastro-esophageal reflux disease without esophagitis: Secondary | ICD-10-CM | POA: Insufficient documentation

## 2019-09-25 DIAGNOSIS — I5022 Chronic systolic (congestive) heart failure: Secondary | ICD-10-CM | POA: Diagnosis not present

## 2019-09-25 DIAGNOSIS — D125 Benign neoplasm of sigmoid colon: Secondary | ICD-10-CM | POA: Diagnosis not present

## 2019-09-25 DIAGNOSIS — D123 Benign neoplasm of transverse colon: Secondary | ICD-10-CM | POA: Insufficient documentation

## 2019-09-25 DIAGNOSIS — E785 Hyperlipidemia, unspecified: Secondary | ICD-10-CM | POA: Insufficient documentation

## 2019-09-25 DIAGNOSIS — Z8572 Personal history of non-Hodgkin lymphomas: Secondary | ICD-10-CM | POA: Diagnosis not present

## 2019-09-25 DIAGNOSIS — Z79899 Other long term (current) drug therapy: Secondary | ICD-10-CM | POA: Diagnosis not present

## 2019-09-25 DIAGNOSIS — Z9884 Bariatric surgery status: Secondary | ICD-10-CM | POA: Diagnosis not present

## 2019-09-25 DIAGNOSIS — Z794 Long term (current) use of insulin: Secondary | ICD-10-CM | POA: Diagnosis not present

## 2019-09-25 DIAGNOSIS — E1122 Type 2 diabetes mellitus with diabetic chronic kidney disease: Secondary | ICD-10-CM | POA: Diagnosis not present

## 2019-09-25 DIAGNOSIS — Z7982 Long term (current) use of aspirin: Secondary | ICD-10-CM | POA: Insufficient documentation

## 2019-09-25 DIAGNOSIS — F419 Anxiety disorder, unspecified: Secondary | ICD-10-CM | POA: Insufficient documentation

## 2019-09-25 DIAGNOSIS — I428 Other cardiomyopathies: Secondary | ICD-10-CM | POA: Insufficient documentation

## 2019-09-25 HISTORY — PX: POLYPECTOMY: SHX5525

## 2019-09-25 HISTORY — PX: COLONOSCOPY WITH PROPOFOL: SHX5780

## 2019-09-25 HISTORY — PX: BIOPSY: SHX5522

## 2019-09-25 LAB — GLUCOSE, CAPILLARY
Glucose-Capillary: 105 mg/dL — ABNORMAL HIGH (ref 70–99)
Glucose-Capillary: 107 mg/dL — ABNORMAL HIGH (ref 70–99)

## 2019-09-25 SURGERY — COLONOSCOPY WITH PROPOFOL
Anesthesia: Monitor Anesthesia Care

## 2019-09-25 MED ORDER — SODIUM CHLORIDE 0.9 % IV SOLN: INTRAVENOUS | Status: DC

## 2019-09-25 MED ORDER — PROPOFOL 500 MG/50ML IV EMUL
INTRAVENOUS | Status: AC
  Filled 2019-09-25: qty 50

## 2019-09-25 MED ORDER — PROPOFOL 10 MG/ML IV BOLUS
INTRAVENOUS | Status: AC
  Filled 2019-09-25: qty 20

## 2019-09-25 MED ORDER — PROPOFOL 10 MG/ML IV BOLUS
INTRAVENOUS | Status: DC | PRN
  Administered 2019-09-25 (×5): 20 mg via INTRAVENOUS

## 2019-09-25 MED ORDER — PHENYLEPHRINE 40 MCG/ML (10ML) SYRINGE FOR IV PUSH (FOR BLOOD PRESSURE SUPPORT)
PREFILLED_SYRINGE | INTRAVENOUS | Status: DC | PRN
  Administered 2019-09-25 (×2): 40 ug via INTRAVENOUS
  Administered 2019-09-25 (×2): 80 ug via INTRAVENOUS

## 2019-09-25 MED ORDER — LIDOCAINE 2% (20 MG/ML) 5 ML SYRINGE
INTRAMUSCULAR | Status: DC | PRN
  Administered 2019-09-25: 80 mg via INTRAVENOUS

## 2019-09-25 MED ORDER — PROPOFOL 500 MG/50ML IV EMUL
INTRAVENOUS | Status: DC | PRN
  Administered 2019-09-25: 125 ug/kg/min via INTRAVENOUS

## 2019-09-25 MED ORDER — LACTATED RINGERS IV SOLN: INTRAVENOUS | Status: DC

## 2019-09-25 SURGICAL SUPPLY — 21 items

## 2019-09-25 NOTE — Transfer of Care (Signed)
Immediate Anesthesia Transfer of Care Note  Patient: EGBERT SEIDEL  Procedure(s) Performed: COLONOSCOPY WITH PROPOFOL (N/A ) BIOPSY  Patient Location: PACU and Endo  Anesthesia Type:MAC  Level of Consciousness: awake, alert  and oriented  Airway & Oxygen Therapy: Patient Spontanous Breathing and Patient connected to face mask oxygen  Post-op Assessment: Report given to RN and Post -op Vital signs reviewed and stable  Post vital signs: Reviewed and stable  Last Vitals:  Vitals Value Taken Time  BP    Temp    Pulse 79 09/25/19 1115  Resp 25 09/25/19 1115  SpO2 100 % 09/25/19 1115  Vitals shown include unvalidated device data.  Last Pain:  Vitals:   09/25/19 0946  TempSrc: Oral  PainSc: 0-No pain         Complications: No complications documented.

## 2019-09-25 NOTE — Anesthesia Postprocedure Evaluation (Signed)
Anesthesia Post Note  Patient: [name] Sosa  Procedure(s) Performed: COLONOSCOPY WITH PROPOFOL (N/A ) BIOPSY     Patient location during evaluation: PACU Anesthesia Type: MAC Level of consciousness: awake and alert Pain management: pain level controlled Vital Signs Assessment: post-procedure vital signs reviewed and stable Respiratory status: spontaneous breathing, nonlabored ventilation, respiratory function stable and patient connected to nasal cannula oxygen Cardiovascular status: blood pressure returned to baseline and stable Postop Assessment: no apparent nausea or vomiting Anesthetic complications: no   No complications documented.  Last Vitals:  Vitals:   09/25/19 1116 09/25/19 1130  BP: 103/62 117/86  Pulse: 79 72  Resp: (!) 30 18  Temp: 36.8 C   SpO2: 100% 99%    Last Pain:  Vitals:   09/25/19 1135  TempSrc:   PainSc: 0-No pain                 [name]

## 2019-09-25 NOTE — H&P (Signed)
Primary Care Physician:  Lavone Orn, MD Primary Gastroenterologist:  Dr.Graydon Fofana  Reason for Visit : Personal history of adenomatous polyp  HPI: [name] Sosa is a 59 y.o. male presented today for surveillance colonoscopy.  Colonoscopy in 2014 showed 3 tubular adenomas.  Repeat was recommended in 5 years.  Patient with past medical history of nonischemic cardiomyopathy with EF of 20 to 25%, history of diabetes, chronic kidney disease and morbid obesity.  He status post gastric bypass in December 2014.  Denies any acute GI symptoms.  Denies any acute chest pain and shortness of breath.  Past Medical History:  Diagnosis Date  . Anemia   . Anxiety    HX OF ANXIETY ATTACKS--CAN'T SLEEP ON BACK-FEELS LIKE HE CAN'T BREATHE  . Apnea, sleep    SEVERE OSA PER STUDY 12/01/12  . Cancer ([place])    HX OF NON HODGKIN'S LYMPHOMA -IN REMISSION  . Cardiomyopathy, nonischemic ([place])    DX 2003 CARDIAC CATH, ECHO 2006 SHOWED EF IMPROVED TO 45-50%--PER CARDIOLOGY OFFICE NOTES DR. Linard Millers FROM 01/20/10  . CHF (congestive heart failure) ([place]) 2003  . Chronic kidney disease    HX OF CHRONIC KIDNEY DISEASE STAGE III - EVALUATED AT [facility] IN THE PAST AND FELT TO BE RELATED TO NSAID'S AND CHEMO.  PT  STATES HE HAS NOT SEEN KIDNEY SPECIALIST AT [facility] IN OVER A YEAR.  Marland Kitchen Chronic systolic heart failure ([place])    CARDIOLOGIST IS DR. Linard Millers  . Claustrophobia   . Diabetes mellitus   . GERD (gastroesophageal reflux disease)    NOT TAKING ANY MEDS FOR REFLUX  . Hyperlipidemia   . Hypertension   . Morbid obesity ([place])   . Multiple thyroid nodules    HX OF NODULES=PT STATES BIOPSY-TOLD ALL OKAY  . Non-Hodgkin lymphoma ([place])    DX ABOUT 2007-TX'D WITH CHEMO-IN REMISSION  . Shortness of breath    WALKING UPSTAIRS    Past Surgical History:  Procedure Laterality Date  . COLONOSCOPY WITH PROPOFOL N/A 01/09/2013   Procedure: COLONOSCOPY WITH PROPOFOL;  Surgeon: Garlan Fair, MD;  [facility];  Service: Endoscopy;  Laterality: N/A;  . GASTRIC ROUX-EN-Y N/A 03/05/2013   Procedure: LAPAROSCOPIC ROUX-EN-Y GASTRIC BYPASS WITH UPPER ENDOSCOPY;  Surgeon: Gayland Curry, MD;  Location[facility];  Service: General;  Laterality: N/A;  . HYDROCELE EXCISION  02/09/2011   Procedure: HYDROCELECTOMY ADULT;  Surgeon: Fredricka Bonine, MD;  Location: [facility];  Service: Urology;  Laterality: Right;    Prior to Admission medications   Medication Sig Start Date End Date Taking? Authorizing Provider  acetaminophen (TYLENOL) 500 MG tablet Take 1,000 mg by mouth every 6 (six) hours as needed for moderate pain. pain   Yes [provider]  ALPRAZolam (XANAX) 0.5 MG tablet Take 0.5 mg by mouth 2 (two) times daily as needed for anxiety.    Yes [provider]  aspirin EC 81 MG tablet Take 1 tablet (81 mg total) by mouth daily. 04/09/19  Yes Camnitz, Will Hassell Done, MD  B Complex Vitamins (B COMPLEX 100 PO) Take 1 drop by mouth daily.   Yes [provider]  calcium carbonate (OS-CAL - DOSED IN MG OF ELEMENTAL CALCIUM) 1250 (500 Ca) MG tablet Take 1 tablet by mouth 2 (two) times daily with a meal.   Yes [provider]  carvedilol (COREG) 25 MG tablet Take 1 tablet (25 mg total) by mouth 2 (two) times daily. 04/09/19  Yes Camnitz, Ocie Doyne, MD  ferrous sulfate 325 (65 FE) MG tablet Take 325 mg by mouth 2 (two) times daily.    Yes [provider]  furosemide (LASIX) 80 MG tablet Take 80 mg by mouth daily as needed for fluid.  01/18/18  Yes [provider]  insulin glargine (LANTUS) 100 UNIT/ML injection Inject 62 Units into the skin every evening.    Yes [provider]  Insulin Lispro, Human, (HUMALOG KWIKPEN [place]) Inject 20-35 Units into the skin 3 (three) times daily before meals.    Yes [provider]  Multiple Vitamin (MULTIVITAMIN WITH MINERALS) TABS tablet Take 1 tablet by mouth 2 (two) times daily.    Yes [provider]  sacubitril-valsartan (ENTRESTO) 49-51 MG Take 1 tablet by mouth 2 (two) times daily. 04/09/19  Yes Camnitz, Will Hassell Done, MD  zolpidem (AMBIEN) 10 MG tablet Take 10 mg by mouth at bedtime as needed for sleep.  01/26/13  Yes [provider]  glucose blood test strip 1 each by Other route as needed.  12/20/07   [provider]  ONE TOUCH ULTRA TEST test strip  03/21/13   [provider]    Scheduled Meds: Continuous Infusions: . sodium chloride     PRN Meds:.  Allergies as of 08/15/2019 - Review Complete 07/23/2019  Allergen Reaction Noted  . Aciphex [rabeprazole sodium]  06/24/2013  . Acyclovir and related  12/05/2017  . Nsaids  02/27/2013    Family History  Adopted: Yes    Social History   Socioeconomic History  . Marital status: Single    Spouse name: Not on file  . Number of children: Not on file  . Years of education: Not on file  . Highest education level: Not on file  Occupational History  . Not on file  Tobacco Use  . Smoking status: Never Smoker  . Smokeless tobacco: Never Used  Substance and Sexual Activity  . Alcohol use: Yes    Comment: RARELY  . Drug use: No  . Sexual activity: Not on file  Other Topics Concern  . Not on file  Social History Narrative  . Not on file   Social Determinants of Health   Financial Resource Strain:   . Difficulty of Paying Living Expenses:   Food Insecurity:   . Worried About Charity fundraiser in the Last Year:   . Arboriculturist in the Last Year:   Transportation Needs:   . Film/video editor (Medical):   Marland Kitchen Lack of Transportation (Non-Medical):   Physical Activity:   . Days of Exercise per Week:   . Minutes of Exercise per Session:   Stress:   . Feeling of Stress :   Social Connections:   . Frequency of Communication with Friends and Family:   . Frequency of Social Gatherings with Friends and Family:   . Attends Religious Services:   . Active Member of Clubs or  Organizations:   . Attends Archivist Meetings:   Marland Kitchen Marital Status:   Intimate Partner Violence:   . Fear of Current or Ex-Partner:   . Emotionally Abused:   Marland Kitchen Physically Abused:   . Sexually Abused:     Physical Exam: Vital signs: Vitals:   09/25/19 0946  BP: 133/77  Pulse: 78  Resp: (!) 23  Temp: 98.6 F (37 C)  SpO2: 100%     General:   Morbidly obese pleasant and cooperative in NAD Lungs: No visible respiratory distress.  Anterior exam only.  Heart:  Regular rate and rhythm Abdomen: Obese abdomen, soft, nontender, nondistended, bowel sounds present.  No peritoneal signs Rectal:  Deferred  GI:  Lab Results: No results for input(s): WBC, HGB, HCT, PLT in the last 72 hours. BMET No results for input(s): NA, K, CL, CO2, GLUCOSE, BUN, CREATININE, CALCIUM in the last 72 hours. LFT No results for input(s): PROT, ALBUMIN, AST, ALT, ALKPHOS, BILITOT, BILIDIR, IBILI in the last 72 hours. PT/INR No results for input(s): LABPROT, INR in the last 72 hours.   Studies/Results: No results found.  Impression/Plan: History of adenomatous polyps Nonischemic cardiomyopathy History of gastric bypass  Recommendations ------------------------ -Proceed with colonoscopy.  Moderate risk for complications because of multiple morbidities discussed with the patient.  Cardiology notes reviewed.  Risks (bleeding, infection, bowel perforation that could require surgery, sedation-related changes in cardiopulmonary systems), benefits (identification and possible treatment of source of symptoms, exclusion of certain causes of symptoms), and alternatives (watchful waiting, radiographic imaging studies, empiric medical treatment)  were explained to patient in detail and patient wishes to proceed.    LOS: 0 days   Otis Brace  MD, FACP 09/25/2019, 9:48 AM  Contact #  [phone]

## 2019-09-25 NOTE — Anesthesia Procedure Notes (Signed)
Procedure Name: MAC Date/Time: 09/25/2019 10:31 AM Performed by: Eben Burow, CRNA Pre-anesthesia Checklist: Patient identified, Emergency Drugs available, Suction available, Patient being monitored and Timeout performed Oxygen Delivery Method: Simple face mask

## 2019-09-25 NOTE — Anesthesia Preprocedure Evaluation (Signed)
Anesthesia Evaluation  Patient identified by MRN, date of birth, ID band Patient awake    Reviewed: Allergy & Precautions, NPO status , Patient's Chart, lab work & pertinent test results  Airway Mallampati: III  TM Distance: >3 FB Neck ROM: Full    Dental no notable dental hx.    Pulmonary sleep apnea ,    breath sounds clear to auscultation + decreased breath sounds      Cardiovascular hypertension, +CHF  Normal cardiovascular exam Rhythm:Regular Rate:Normal  EF 25-30%   Neuro/Psych negative neurological ROS  negative psych ROS   GI/Hepatic negative GI ROS, Neg liver ROS,   Endo/Other  diabetes, Insulin DependentMorbid obesity  Renal/GU Renal InsufficiencyRenal disease  negative genitourinary   Musculoskeletal negative musculoskeletal ROS (+)   Abdominal   Peds negative pediatric ROS (+)  Hematology negative hematology ROS (+)   Anesthesia Other Findings   Reproductive/Obstetrics negative OB ROS                             Anesthesia Physical Anesthesia Plan  ASA: III  Anesthesia Plan: MAC   Post-op Pain Management:    Induction: Intravenous  PONV Risk Score and Plan: 0  Airway Management Planned: Simple Face Mask  Additional Equipment:   Intra-op Plan:   Post-operative Plan:   Informed Consent: I have reviewed the patients History and Physical, chart, labs and discussed the procedure including the risks, benefits and alternatives for the proposed anesthesia with the patient or authorized representative who has indicated his/her understanding and acceptance.     Dental advisory given  Plan Discussed with: CRNA and Surgeon  Anesthesia Plan Comments:         Anesthesia Quick Evaluation

## 2019-09-25 NOTE — Op Note (Signed)
[facility] Patient Name: [name] Sosa Procedure Date: 09/25/2019 MRN: [id number] Attending MD: Otis Brace , MD Date of Birth: 1960/05/05 CSN: [id number] Age: 59 Admit Type: Outpatient Procedure:                Colonoscopy Indications:              Surveillance: Personal history of adenomatous                            polyps on last colonoscopy > 5 years ago, Last                            colonoscopy: 2014 Providers:                Otis Brace, MD, Cleda Daub, RN, Elspeth Cho Tech., Technician, Jefm Miles CRNA Referring MD:              Medicines:                Sedation Administered by an Anesthesia Professional Complications:            No immediate complications. Estimated Blood Loss:     Estimated blood loss was minimal. Procedure:                Pre-Anesthesia Assessment:                           - Prior to the procedure, a History and Physical                            was performed, and patient medications and                            allergies were reviewed. The patient's tolerance of                            previous anesthesia was also reviewed. The risks                            and benefits of the procedure and the sedation                            options and risks were discussed with the patient.                            All questions were answered, and informed consent                            was obtained. Prior Anticoagulants: The patient has                            taken no previous anticoagulant or antiplatelet  agents except for aspirin. ASA Grade Assessment:                            III - A patient with severe systemic disease. After                            reviewing the risks and benefits, the patient was                            deemed in satisfactory condition to undergo the                            procedure.                           After  obtaining informed consent, the colonoscope                            was passed under direct vision. Throughout the                            procedure, the patient's blood pressure, pulse, and                            oxygen saturations were monitored continuously. The                            PCF-H190DL ([id number] Olympus pediatric colonscope                            was introduced through the anus and advanced to the                            the terminal ileum, with identification of the                            appendiceal orifice and IC valve. The colonoscopy                            was performed without difficulty. The patient                            tolerated the procedure well. The quality of the                            bowel preparation was adequate to identify polyps 6                            mm and larger in size. Scope In: 10:39:20 AM Scope Out: 42:70:62 AM Scope Withdrawal Time: 0 hours 22 minutes 36 seconds  Total Procedure Duration: 0 hours 26 minutes 46 seconds  Findings:      The perianal and digital rectal examinations were normal.      The terminal ileum appeared normal.  A 15 mm polypoid lesion was found at the ileocecal valve. The lesion was       sessile. No bleeding was present. Biopsies were taken with a cold       forceps for histology.      A 2 mm polyp was found in the hepatic flexure. The polyp was removed       with a cold biopsy forceps. Resection and retrieval were complete.      Two polyps were found in the transverse colon. The polyps were       diminutive in size. These polyps were removed with a cold biopsy       forceps. Resection and retrieval were complete.      Two sessile polyps were found in the sigmoid colon. The polyps were 4 to       5 mm in size. These polyps were removed with a cold snare. Resection and       retrieval were complete.      Internal hemorrhoids were found during retroflexion. The hemorrhoids        were small. Impression:               - The examined portion of the ileum was normal.                           - Polypoid lesion at the ileocecal valve. Biopsied.                           - One 2 mm polyp at the hepatic flexure, removed                            with a cold biopsy forceps. Resected and retrieved.                           - Two diminutive polyps in the transverse colon,                            removed with a cold biopsy forceps. Resected and                            retrieved.                           - Two 4 to 5 mm polyps in the sigmoid colon,                            removed with a cold snare. Resected and retrieved.                           - Internal hemorrhoids. Moderate Sedation:      Moderate (conscious) sedation was personally administered by an       anesthesia professional. The following parameters were monitored: oxygen       saturation, heart rate, blood pressure, and response to care. Recommendation:           - Patient has a contact number available for  emergencies. The signs and symptoms of potential                            delayed complications were discussed with the                            patient. Return to normal activities tomorrow.                            Written discharge instructions were provided to the                            patient.                           - Resume previous diet.                           - Continue present medications.                           - Await pathology results.                           - Repeat colonoscopy date to be determined after                            pending pathology results are reviewed for                            surveillance based on pathology results.                           - Return to my office PRN. Procedure Code(s):        --- Professional ---                           [id number], Colonoscopy, flexible; with removal of                             tumor(s), polyp(s), or other lesion(s) by snare                            technique                           45380, 25, Colonoscopy, flexible; with biopsy,                            single or multiple Diagnosis Code(s):        --- Professional ---                           K63.5, Polyp of colon                           Z86.010, Personal history of colonic polyps  K64.8, Other hemorrhoids                           D49.0, Neoplasm of unspecified behavior of                            digestive system CPT copyright 2019 [employer]. All rights reserved. The codes documented in this report are preliminary and upon coder review may  be revised to meet current compliance requirements. Otis Brace, MD Otis Brace, MD 09/25/2019 11:20:02 AM Number of Addenda: 0

## 2019-09-25 NOTE — Discharge Instructions (Signed)

## 2019-09-26 LAB — SURGICAL PATHOLOGY

## 2019-09-28 ENCOUNTER — Other Ambulatory Visit (HOSPITAL_BASED_OUTPATIENT_CLINIC_OR_DEPARTMENT_OTHER): Payer: Commercial Managed Care - PPO

## 2019-09-28 ENCOUNTER — Encounter (HOSPITAL_COMMUNITY): Payer: Self-pay | Admitting: Gastroenterology

## 2019-10-16 ENCOUNTER — Other Ambulatory Visit: Payer: Self-pay | Admitting: Cardiology

## 2019-10-22 ENCOUNTER — Other Ambulatory Visit (HOSPITAL_BASED_OUTPATIENT_CLINIC_OR_DEPARTMENT_OTHER): Payer: PRIVATE HEALTH INSURANCE

## 2019-11-14 ENCOUNTER — Ambulatory Visit (HOSPITAL_BASED_OUTPATIENT_CLINIC_OR_DEPARTMENT_OTHER): Payer: No Typology Code available for payment source

## 2019-11-14 ENCOUNTER — Other Ambulatory Visit (HOSPITAL_BASED_OUTPATIENT_CLINIC_OR_DEPARTMENT_OTHER): Payer: PRIVATE HEALTH INSURANCE

## 2019-12-05 ENCOUNTER — Ambulatory Visit: Payer: No Typology Code available for payment source | Admitting: Cardiology

## 2019-12-07 ENCOUNTER — Other Ambulatory Visit: Payer: No Typology Code available for payment source

## 2019-12-19 ENCOUNTER — Other Ambulatory Visit: Payer: Self-pay

## 2019-12-19 ENCOUNTER — Ambulatory Visit (HOSPITAL_BASED_OUTPATIENT_CLINIC_OR_DEPARTMENT_OTHER)
Admission: RE | Admit: 2019-12-19 | Discharge: 2019-12-19 | Disposition: A | Discharge status: Home / Self Care | Payer: No Typology Code available for payment source | Source: Ambulatory Visit | Attending: Cardiology | Admitting: Cardiology

## 2019-12-19 DIAGNOSIS — I428 Other cardiomyopathies: Secondary | ICD-10-CM | POA: Insufficient documentation

## 2019-12-19 LAB — ECHOCARDIOGRAM COMPLETE
Area-P 1/2: 4.31 cm2
S' Lateral: 5.59 cm
Single Plane A4C EF: 38.3 %

## 2019-12-19 MED ORDER — PERFLUTREN LIPID MICROSPHERE
4.0000 mL | INTRAVENOUS | Status: AC | PRN
  Administered 2019-12-19: 4 mL via INTRAVENOUS
  Filled 2019-12-19: qty 4

## 2019-12-31 ENCOUNTER — Ambulatory Visit: Payer: No Typology Code available for payment source | Admitting: Cardiology

## 2019-12-31 ENCOUNTER — Other Ambulatory Visit: Payer: Self-pay

## 2019-12-31 DIAGNOSIS — R0602 Shortness of breath: Secondary | ICD-10-CM | POA: Insufficient documentation

## 2019-12-31 DIAGNOSIS — I428 Other cardiomyopathies: Secondary | ICD-10-CM | POA: Insufficient documentation

## 2019-12-31 DIAGNOSIS — K219 Gastro-esophageal reflux disease without esophagitis: Secondary | ICD-10-CM | POA: Insufficient documentation

## 2019-12-31 DIAGNOSIS — F40240 Claustrophobia: Secondary | ICD-10-CM | POA: Insufficient documentation

## 2019-12-31 DIAGNOSIS — E785 Hyperlipidemia, unspecified: Secondary | ICD-10-CM | POA: Insufficient documentation

## 2019-12-31 DIAGNOSIS — C801 Malignant (primary) neoplasm, unspecified: Secondary | ICD-10-CM | POA: Insufficient documentation

## 2019-12-31 DIAGNOSIS — I10 Essential (primary) hypertension: Secondary | ICD-10-CM | POA: Insufficient documentation

## 2019-12-31 DIAGNOSIS — I5022 Chronic systolic (congestive) heart failure: Secondary | ICD-10-CM | POA: Insufficient documentation

## 2019-12-31 DIAGNOSIS — E042 Nontoxic multinodular goiter: Secondary | ICD-10-CM | POA: Insufficient documentation

## 2019-12-31 DIAGNOSIS — C8590 Non-Hodgkin lymphoma, unspecified, unspecified site: Secondary | ICD-10-CM | POA: Insufficient documentation

## 2019-12-31 DIAGNOSIS — N189 Chronic kidney disease, unspecified: Secondary | ICD-10-CM | POA: Insufficient documentation

## 2019-12-31 DIAGNOSIS — G4730 Sleep apnea, unspecified: Secondary | ICD-10-CM | POA: Insufficient documentation

## 2019-12-31 DIAGNOSIS — D649 Anemia, unspecified: Secondary | ICD-10-CM | POA: Insufficient documentation

## 2019-12-31 DIAGNOSIS — F419 Anxiety disorder, unspecified: Secondary | ICD-10-CM | POA: Insufficient documentation

## 2020-01-01 ENCOUNTER — Encounter: Payer: Self-pay | Admitting: Cardiology

## 2020-01-01 ENCOUNTER — Other Ambulatory Visit: Payer: Self-pay

## 2020-01-01 ENCOUNTER — Ambulatory Visit (INDEPENDENT_AMBULATORY_CARE_PROVIDER_SITE_OTHER): Payer: No Typology Code available for payment source | Admitting: Cardiology

## 2020-01-01 VITALS — BP 128/78 | HR 84 | Ht 73.0 in | Wt 377.0 lb

## 2020-01-01 DIAGNOSIS — Z794 Long term (current) use of insulin: Secondary | ICD-10-CM

## 2020-01-01 DIAGNOSIS — G4733 Obstructive sleep apnea (adult) (pediatric): Secondary | ICD-10-CM

## 2020-01-01 DIAGNOSIS — I428 Other cardiomyopathies: Secondary | ICD-10-CM | POA: Diagnosis not present

## 2020-01-01 DIAGNOSIS — I5022 Chronic systolic (congestive) heart failure: Secondary | ICD-10-CM | POA: Diagnosis not present

## 2020-01-01 DIAGNOSIS — E1169 Type 2 diabetes mellitus with other specified complication: Secondary | ICD-10-CM

## 2020-01-01 DIAGNOSIS — E782 Mixed hyperlipidemia: Secondary | ICD-10-CM | POA: Diagnosis not present

## 2020-01-01 DIAGNOSIS — N183 Chronic kidney disease, stage 3 unspecified: Secondary | ICD-10-CM

## 2020-01-01 MED ORDER — DAPAGLIFLOZIN PROPANEDIOL 5 MG PO TABS: 5.0000 mg | ORAL_TABLET | Freq: Every day | ORAL | 3 refills | Status: DC

## 2020-01-01 NOTE — Patient Instructions (Signed)
Medication Instructions:  Your physician has recommended you make the following change in your medication:   Start Farxiga 5 mg daily.  *If you need a refill on your cardiac medications before your next appointment, please call your pharmacy*   Lab Work: None ordered If you have labs (blood work) drawn today and your tests are completely normal, you will receive your results only by: Marland Kitchen MyChart Message (if you have MyChart) OR . A paper copy in the mail If you have any lab test that is abnormal or we need to change your treatment, we will call you to review the results.   Testing/Procedures: None ordered   Follow-Up: At [facility], you and your health needs are our priority.  As part of our continuing mission to provide you with exceptional heart care, we have created designated Provider Care Teams.  These Care Teams include your primary Cardiologist (physician) and Advanced Practice Providers (APPs -  Physician Assistants and Nurse Practitioners) who all work together to provide you with the care you need, when you need it.  We recommend signing up for the patient portal called "MyChart".  Sign up information is provided on this After Visit Summary.  MyChart is used to connect with patients for Virtual Visits (Telemedicine).  Patients are able to view lab/test results, encounter notes, upcoming appointments, etc.  Non-urgent messages can be sent to your provider as well.   To learn more about what you can do with MyChart, go to [email or web address].    Your next appointment:   1 month(s)  The format for your next appointment:   In Person  Provider:   Jyl Heinz, MD   Other Instructions Dapagliflozin tablets What is this medicine? DAPAGLIFLOZIN (DAP a gli FLOE zin) controls blood sugar in people with diabetes. It is used with lifestyle changes like diet and exercise. It also treats heart failure. It may lower the need for treatment of heart failure in the  hospital. This medicine may be used for other purposes; ask your health care provider or pharmacist if you have questions. COMMON BRAND NAME(S): Wilder Glade What should I tell my health care provider before I take this medicine? They need to know if you have any of these conditions:  dehydration  diabetic ketoacidosis  diet low in salt  eating less due to illness, surgery, dieting, or any other reason  having surgery  history of pancreatitis or pancreas problems  history of yeast infection of the penis or vagina  if you often drink alcohol  infections in the bladder, kidneys, or urinary tract  kidney disease  low blood pressure  on hemodialysis  problems urinating  type 1 diabetes  uncircumcised male  an unusual or allergic reaction to dapagliflozin, other medicines, foods, dyes, or preservatives  pregnant or trying to get pregnant  breast-feeding How should I use this medicine? Take this medicine by mouth with a glass of water. Follow the directions on the prescription label. You can take it with or without food. If it upsets your stomach, take it with food. Take this medicine in the morning. Take your dose at the same time each day. Do not take more often than directed. Do not stop taking except on your doctor's advice. A special MedGuide will be given to you by the pharmacist with each prescription and refill. Be sure to read this information carefully each time. Talk to your pediatrician regarding the use of this medicine in children. Special care may be needed. Overdosage: If you think  you have taken too much of this medicine contact a poison control center or emergency room at once. NOTE: This medicine is only for you. Do not share this medicine with others. What if I miss a dose? If you miss a dose, take it as soon as you can. If it is almost time for your next dose, take only that dose. Do not take double or extra doses. What may interact with this medicine? Do  not take this medicine with any of the following medications:  gatifloxacin This medicine may also interact with the following medications:  alcohol  certain medicines for blood pressure, heart disease  diuretics  insulin  nateglinide  pioglitazone  quinolone antibiotics like ciprofloxacin, levofloxacin, ofloxacin  repaglinide  some herbal dietary supplements  steroid medicines like prednisone or cortisone  sulfonylureas like glimepiride, glipizide, glyburide  thyroid medicine This list may not describe all possible interactions. Give your health care provider a list of all the medicines, herbs, non-prescription drugs, or dietary supplements you use. Also tell them if you smoke, drink alcohol, or use illegal drugs. Some items may interact with your medicine. What should I watch for while using this medicine? Visit your doctor or health care professional for regular checks on your progress. This medicine can cause a serious condition in which there is too much acid in the blood. If you develop nausea, vomiting, stomach pain, unusual tiredness, or breathing problems, stop taking this medicine and call your doctor right away. If possible, use a ketone dipstick to check for ketones in your urine. A test called the HbA1C (A1C) will be monitored. This is a simple blood test. It measures your blood sugar control over the last 2 to 3 months. You will receive this test every 3 to 6 months. Learn how to check your blood sugar. Learn the symptoms of low and high blood sugar and how to manage them. Always carry a quick-source of sugar with you in case you have symptoms of low blood sugar. Examples include hard sugar candy or glucose tablets. Make sure others know that you can choke if you eat or drink when you develop serious symptoms of low blood sugar, such as seizures or unconsciousness. They must get medical help at once. Tell your doctor or health care professional if you have high blood  sugar. You might need to change the dose of your medicine. If you are sick or exercising more than usual, you might need to change the dose of your medicine. Do not skip meals. Ask your doctor or health care professional if you should avoid alcohol. Many nonprescription cough and cold products contain sugar or alcohol. These can affect blood sugar. Wear a medical ID bracelet or chain, and carry a card that describes your disease and details of your medicine and dosage times. What side effects may I notice from receiving this medicine? Side effects that you should report to your doctor or health care professional as soon as possible:  allergic reactions like skin rash, itching or hives, swelling of the face, lips, or tongue  breathing problems  dizziness  feeling faint or lightheaded, falls  muscle weakness  nausea, vomiting, unusual stomach upset or pain  new pain or tenderness, change in skin color, sores or ulcers, or infection in legs or feet  penile discharge, itching, or pain in men  signs and symptoms of a genital infection, such as fever; tenderness, redness, or swelling in the genitals or area from the genitals to the back of  the rectum  signs and symptoms of low blood sugar such as feeling anxious, confusion, dizziness, increased hunger, unusually weak or tired, sweating, shakiness, cold, irritable, headache, blurred vision, fast heartbeat, loss of consciousness  signs and symptoms of a urinary tract infection, such as fever, chills, a burning feeling when urinating, blood in the urine, back pain  trouble passing urine or change in the amount of urine, including an urgent need to urinate more often, in larger amounts, or at night  unusual tiredness  vaginal discharge, itching, or odor in women Side effects that usually do not require medical attention (report to your doctor or health care professional if they continue or are bothersome):  mild increase in  urination  thirsty This list may not describe all possible side effects. Call your doctor for medical advice about side effects. You may report side effects to FDA at 1-800-FDA-1088. Where should I keep my medicine? Keep out of the reach of children. Store at room temperature between 15 and 30 degrees C (59 and 86 degrees F). Throw away any unused medicine after the expiration date. NOTE: This sheet is a summary. It may not cover all possible information. If you have questions about this medicine, talk to your doctor, pharmacist, or health care provider.  2020 [employer]vier/Gold Standard (2018-07-27 18:58:14)

## 2020-01-01 NOTE — Progress Notes (Signed)
Cardiology Office Note:    Date:  01/01/2020   ID:  [name] Sosa, DOB 04/30/1960, MRN [id number]  PCP:  Lavone Orn, MD  Cardiologist:  Jenean Lindau, MD   Referring MD: Lavone Orn, MD    ASSESSMENT:    1. Nonischemic cardiomyopathy ([place])   2. Chronic systolic heart failure ([place])   3. Mixed hyperlipidemia   4. OSA (obstructive sleep apnea)    PLAN:    In order of problems listed above:  1. I discussed my findings with the patient at extensive length and primary prevention stressed.  Importance of compliance with diet medication stressed and he vocalized understanding. 2. Nonischemic cardiomyopathy: Congestive heart failure: I discussed findings with the patient at length.  I discussed [place].  He is also diabetic.  He is agreeable.  We will initiate him on Farxiga and see him in follow-up appointment in a month.  Benefits and potential risks explained and he vocalized understanding. 3. Essential hypertension: Blood pressure stable and diet as mentioned and emphasized including salt intake and lifestyle modification mixed 4. Dyslipidemia and diabetes mellitus: Again diet and exercise was stressed.  He promises to do better. 5. Morbid obesity: I discussed this with him and the risks.  He also has sleep apnea and he follows this and the use of his sleep apnea apparatus meticulously.  Weight reduction was stressed risks of obesity explained and he promises to do better. 6. Patient will be seen in follow-up appointment in 4 weeks or earlier if the patient has any concerns    Medication Adjustments/Labs and Tests Ordered: Current medicines are reviewed at length with the patient today.  Concerns regarding medicines are outlined above.  No orders of the defined types were placed in this encounter.  No orders of the defined types were placed in this encounter.    No chief complaint on file.    History of Present Illness:    [name] Sosa is a 59 y.o. male.   Patient has past medical history of nonischemic cardiomyopathy, essential hypertension dyslipidemia and diabetes mellitus.  He recently saw electrophysiology colleagues and was not keen on evaluation and defibrillator placement.  His ejection fraction is 35 to 40% on last echocardiogram and details are mentioned below.  Unfortunately he has not been quite compliant with diet and exercise and has gained significant weight.  At the time of my evaluation, the patient is alert awake oriented and in no distress.  Past Medical History:  Diagnosis Date  . Anemia   . Anxiety    HX OF ANXIETY ATTACKS--CAN'T SLEEP ON BACK-FEELS LIKE HE CAN'T BREATHE  . Apnea, sleep    SEVERE OSA PER STUDY 12/01/12  . Cancer ([place])    HX OF NON HODGKIN'S LYMPHOMA -IN REMISSION  . Cardiomyopathy ([place]) 04/27/2018  . Cardiomyopathy, nonischemic ([place])    DX 2003 CARDIAC CATH, ECHO 2006 SHOWED EF IMPROVED TO 45-50%--PER CARDIOLOGY OFFICE NOTES DR. Linard Millers FROM 01/20/10  . CHF (congestive heart failure) ([place]) 2003  . Chronic kidney disease    HX OF CHRONIC KIDNEY DISEASE STAGE III - EVALUATED AT [facility] IN THE PAST AND FELT TO BE RELATED TO NSAID'S AND CHEMO.  PT  STATES HE HAS NOT SEEN KIDNEY SPECIALIST AT [facility] IN OVER A YEAR.  [name] Sosa Chronic systolic heart failure ([place])    CARDIOLOGIST IS DR. Linard Millers  . CKD (chronic kidney disease), stage III ([place]) 12/08/2012  . Claustrophobia   . Diabetes ([place]) 04/27/2012  . Diabetes mellitus   .  Diffuse large cell lymphoma in remission ([place]) 04/27/2012  . GERD (gastroesophageal reflux disease)    NOT TAKING ANY MEDS FOR REFLUX  . History of Lap Roux-en-Y gastric bypass 03/05/13 03/23/2013  . Hyperlipidemia   . Hypertension   . IDDM (insulin dependent diabetes mellitus) 12/08/2012  . Morbid obesity ([place])   . Multiple thyroid nodules    HX OF NODULES=PT STATES BIOPSY-TOLD ALL OKAY  . Non-Hodgkin lymphoma ([place])    DX ABOUT 2007-TX'D WITH CHEMO-IN REMISSION  . Nonischemic cardiomyopathy ([place])  11/25/2017  . OSA (obstructive sleep apnea) 12/08/2012  . Shortness of breath    WALKING UPSTAIRS    Past Surgical History:  Procedure Laterality Date  . BIOPSY  09/25/2019   Procedure: BIOPSY;  Surgeon: Otis Brace, MD;  Location: [facility];  Service: Gastroenterology;;  . COLONOSCOPY WITH PROPOFOL N/A 01/09/2013   Procedure: COLONOSCOPY WITH PROPOFOL;  Surgeon: Garlan Fair, MD;  Location: [facility];  Service: Endoscopy;  Laterality: N/A;  . COLONOSCOPY WITH PROPOFOL N/A 09/25/2019   Procedure: COLONOSCOPY WITH PROPOFOL;  Surgeon: Otis Brace, MD;  [facility];  Service: Gastroenterology;  Laterality: N/A;  . GASTRIC ROUX-EN-Y N/A 03/05/2013   Procedure: LAPAROSCOPIC ROUX-EN-Y GASTRIC BYPASS WITH UPPER ENDOSCOPY;  Surgeon: Gayland Curry, MD;  Location: [facility];  Service: General;  Laterality: N/A;  . HYDROCELE EXCISION  02/09/2011   Procedure: HYDROCELECTOMY ADULT;  Surgeon: Fredricka Bonine, MD;  [facility]: [facility];  Service: Urology;  Laterality: Right;  . POLYPECTOMY  09/25/2019   Procedure: POLYPECTOMY;  Surgeon: Otis Brace, MD;  [facility];  Service: Gastroenterology;;    Current Medications: Current Meds  Medication Sig  . acetaminophen (TYLENOL) 500 MG tablet Take 1,000 mg by mouth every 6 (six) hours as needed for moderate pain. pain  . ALPRAZolam (XANAX) 0.5 MG tablet Take 0.5 mg by mouth 2 (two) times daily as needed for anxiety.   [name] Sosa aspirin EC 81 MG tablet Take 1 tablet (81 mg total) by mouth daily.  . B Complex Vitamins (B COMPLEX 100 PO) Take 1 drop by mouth daily.  . calcium carbonate (OS-CAL - DOSED IN MG OF ELEMENTAL CALCIUM) 1250 (500 Ca) MG tablet Take 2 tablets by mouth 2 (two) times daily with a meal.   . carvedilol (COREG) 25 MG tablet TAKE 1 TABLET BY MOUTH TWICE A DAY  . ENTRESTO 49-51 MG TAKE 1 TABLET BY MOUTH TWICE A DAY  . ferrous sulfate 325 (65 FE) MG tablet Take 325 mg by mouth 2 (two) times daily.   .  furosemide (LASIX) 80 MG tablet Take 80 mg by mouth daily as needed for fluid.   [name] Sosa glucose blood test strip 1 each by Other route as needed.   [name] Sosa HUMALOG KWIKPEN 200 UNIT/ML KwikPen Inject 35 Units into the skin daily. 20-35 per meal  . LANTUS SOLOSTAR 100 UNIT/ML Solostar Pen Inject 70 Units into the skin daily.   . Multiple Vitamin (MULTIVITAMIN WITH MINERALS) TABS tablet Take 1 tablet by mouth 2 (two) times daily.   . ONE TOUCH ULTRA TEST test strip   . zolpidem (AMBIEN) 10 MG tablet Take 10 mg by mouth at bedtime as needed for sleep.      Allergies:   Aciphex [rabeprazole sodium], Acyclovir and related, and Nsaids   Social History   Socioeconomic History  . Marital status: Single    Spouse name: Not on file  . Number of children: Not on file  . Years of education: Not  on file  . Highest education level: Not on file  Occupational History  . Not on file  Tobacco Use  . Smoking status: Never Smoker  . Smokeless tobacco: Never Used  Substance and Sexual Activity  . Alcohol use: Yes    Comment: RARELY  . Drug use: No  . Sexual activity: Not on file  Other Topics Concern  . Not on file  Social History Narrative  . Not on file   Social Determinants of Health   Financial Resource Strain:   . Difficulty of Paying Living Expenses: Not on file  Food Insecurity:   . Worried About Charity fundraiser in the Last Year: Not on file  . Ran Out of Food in the Last Year: Not on file  Transportation Needs:   . Lack of Transportation (Medical): Not on file  . Lack of Transportation (Non-Medical): Not on file  Physical Activity:   . Days of Exercise per Week: Not on file  . Minutes of Exercise per Session: Not on file  Stress:   . Feeling of Stress : Not on file  Social Connections:   . Frequency of Communication with Friends and Family: Not on file  . Frequency of Social Gatherings with Friends and Family: Not on file  . Attends Religious Services: Not on file  . Active Member of  Clubs or Organizations: Not on file  . Attends Archivist Meetings: Not on file  . Marital Status: Not on file     Family History: The patient's family history is not on file. He was adopted.  ROS:   Please see the history of present illness.    All other systems reviewed and are negative.  EKGs/Labs/Other Studies Reviewed:    The following studies were reviewed today: IMPRESSIONS    1. Left ventricular ejection fraction, by estimation, is 30 to 35%. The  left ventricle has severely decreased function. The left ventricle  demonstrates global hypokinesis. The left ventricular internal cavity size  was mildly dilated. Left ventricular  diastolic parameters are consistent with Grade I diastolic dysfunction  (impaired relaxation).  2. Right ventricular systolic function was not well visualized. The right  ventricular size is normal.  3. The mitral valve is normal in structure. No evidence of mitral valve  regurgitation. No evidence of mitral stenosis.  4. The aortic valve is tricuspid. Aortic valve regurgitation is not  visualized. No aortic stenosis is present.  5. The inferior vena cava is normal in size with greater than 50%  respiratory variability, suggesting right atrial pressure of 3 mmHg.    Recent Labs: 02/21/2019: ALT 17; Hemoglobin 13.6; Platelets 256; TSH 0.360 07/23/2019: BUN 21; Creatinine, Ser 2.08; Potassium 4.5; Sodium 139  Recent Lipid Panel    Component Value Date/Time   CHOL 165 02/21/2019 0913   TRIG 116 02/21/2019 0913   HDL 57 02/21/2019 0913   CHOLHDL 2.9 02/21/2019 0913   CHOLHDL 3.2 08/07/2013 0815   VLDL 19 08/07/2013 0815   LDLCALC 87 02/21/2019 0913    Physical Exam:    VS:  BP 128/78   Pulse 84   Ht 6\' 1"  (1.854 m)   Wt (!) 377 lb (171 kg)   SpO2 98%   BMI 49.74 kg/m     Wt Readings from Last 3 Encounters:  01/01/20 (!) 377 lb (171 kg)  09/25/19 (!) 365 lb (165.6 kg)  07/23/19 (!) 369 lb (167.4 kg)     GEN: Patient  is in no  acute distress HEENT: Normal NECK: No JVD; No carotid bruits LYMPHATICS: No lymphadenopathy CARDIAC: Hear sounds regular, 2/6 systolic murmur at the apex. RESPIRATORY:  Clear to auscultation without rales, wheezing or rhonchi  ABDOMEN: Soft, non-tender, non-distended MUSCULOSKELETAL:  No edema; No deformity  SKIN: Warm and dry NEUROLOGIC:  Alert and oriented x 3 PSYCHIATRIC:  Normal affect   Signed, Jenean Lindau, MD  01/01/2020 4:37 PM    [facility]

## 2020-02-01 ENCOUNTER — Ambulatory Visit (INDEPENDENT_AMBULATORY_CARE_PROVIDER_SITE_OTHER): Payer: No Typology Code available for payment source | Admitting: Cardiology

## 2020-02-01 ENCOUNTER — Encounter: Payer: Self-pay | Admitting: Cardiology

## 2020-02-01 ENCOUNTER — Other Ambulatory Visit: Payer: Self-pay

## 2020-02-01 VITALS — BP 130/81 | HR 76 | Ht 73.0 in | Wt 372.0 lb

## 2020-02-01 DIAGNOSIS — I5020 Unspecified systolic (congestive) heart failure: Secondary | ICD-10-CM | POA: Diagnosis not present

## 2020-02-01 DIAGNOSIS — E782 Mixed hyperlipidemia: Secondary | ICD-10-CM

## 2020-02-01 DIAGNOSIS — I428 Other cardiomyopathies: Secondary | ICD-10-CM | POA: Diagnosis not present

## 2020-02-01 DIAGNOSIS — Z794 Long term (current) use of insulin: Secondary | ICD-10-CM

## 2020-02-01 DIAGNOSIS — E1169 Type 2 diabetes mellitus with other specified complication: Secondary | ICD-10-CM

## 2020-02-01 DIAGNOSIS — N183 Chronic kidney disease, stage 3 unspecified: Secondary | ICD-10-CM

## 2020-02-01 DIAGNOSIS — G4733 Obstructive sleep apnea (adult) (pediatric): Secondary | ICD-10-CM

## 2020-02-01 NOTE — Patient Instructions (Signed)
Medication Instructions:  No medication changes. *If you need a refill on your cardiac medications before your next appointment, please call your pharmacy*   Lab Work: Your physician recommends that you return for lab work in: before your next visit. You need to have labs done when you are fasting.  You can come Monday through Friday 8:30 am to 12:00 pm and 1:15 to 4:30. You do not need to make an appointment as the order has already been placed. The labs you are going to have done are BMET, CBC, TSH, hgb A1C, LFT and Lipids.   If you have labs (blood work) drawn today and your tests are completely normal, you will receive your results only by: Marland Kitchen MyChart Message (if you have MyChart) OR . A paper copy in the mail If you have any lab test that is abnormal or we need to change your treatment, we will call you to review the results.   Testing/Procedures: None ordered   Follow-Up: At [facility], you and your health needs are our priority.  As part of our continuing mission to provide you with exceptional heart care, we have created designated Provider Care Teams.  These Care Teams include your primary Cardiologist (physician) and Advanced Practice Providers (APPs -  Physician Assistants and Nurse Practitioners) who all work together to provide you with the care you need, when you need it.  We recommend signing up for the patient portal called "MyChart".  Sign up information is provided on this After Visit Summary.  MyChart is used to connect with patients for Virtual Visits (Telemedicine).  Patients are able to view lab/test results, encounter notes, upcoming appointments, etc.  Non-urgent messages can be sent to your provider as well.   To learn more about what you can do with MyChart, go to [email or web address].    Your next appointment:   2 month(s)  The format for your next appointment:   In Person  Provider:   Jyl Heinz, MD   Other Instructions NA

## 2020-02-01 NOTE — Progress Notes (Signed)
Cardiology Office Note:    Date:  02/01/2020   ID:  [name] Sosa, DOB 07/05/1960, MRN [id number]  PCP:  Lavone Orn, MD  Cardiologist:  Jenean Lindau, MD   Referring MD: Lavone Orn, MD    ASSESSMENT:    1. Cardiomyopathy, nonischemic ([place])   2. Systolic congestive heart failure, unspecified HF chronicity ([place])   3. Mixed hyperlipidemia   4. Morbid obesity ([place])   5. OSA (obstructive sleep apnea)   6. Stage 3 chronic kidney disease, unspecified whether stage 3a or 3b CKD ([place])    PLAN:    In order of problems listed above:  1. Primary prevention stressed with the patient.  Importance of compliance with diet medication stressed any vocalized understanding. 2. Cardiomyopathy: Nonischemic: We will treat with Delene Loll and Wilder Glade and guideline directed medical therapy and tolerating well.  Diet was emphasized.  Weight reduction stressed and he promises to do better. 3. Mixed dyslipidemia: Lipids were reviewed and we will check them again before his next appointment in 2 months. 4. Obstructive sleep apnea: Sleep health issues were discussed. 5. Essential hypertension: Blood pressure stable and diet was emphasized including salt intake issues. 6. Renal insufficiency: Creatinine was reviewed from the past and found to be stable.  He was advised not to take over-the-counter medication without talking to his primary care physician.    Medication Adjustments/Labs and Tests Ordered: Current medicines are reviewed at length with the patient today.  Concerns regarding medicines are outlined above.  No orders of the defined types were placed in this encounter.  No orders of the defined types were placed in this encounter.    No chief complaint on file.    History of Present Illness:    [name] Sosa is a 59 y.o. male.  Patient has past medical history of nonischemic cardiomyopathy, essential hypertension dyslipidemia morbid obesity and renal insufficiency.  He was  initiated on Farxiga recently and tolerates it well.  He denies any issues with it.  No chest pain orthopnea or PND.  He has not lost much weight and does not exercise regularly because he has injured his knee.  At the time of my evaluation, the patient is alert awake oriented and in no distress.  Past Medical History:  Diagnosis Date  . Anemia   . Anxiety    HX OF ANXIETY ATTACKS--CAN'T SLEEP ON BACK-FEELS LIKE HE CAN'T BREATHE  . Apnea, sleep    SEVERE OSA PER STUDY 12/01/12  . Cancer ([place])    HX OF NON HODGKIN'S LYMPHOMA -IN REMISSION  . Cardiomyopathy ([place]) 04/27/2018  . Cardiomyopathy, nonischemic ([place])    DX 2003 CARDIAC CATH, ECHO 2006 SHOWED EF IMPROVED TO 45-50%--PER CARDIOLOGY OFFICE NOTES DR. Linard Millers FROM 01/20/10  . CHF (congestive heart failure) ([place]) 2003  . Chronic kidney disease    HX OF CHRONIC KIDNEY DISEASE STAGE III - EVALUATED AT [facility] IN THE PAST AND FELT TO BE RELATED TO NSAID'S AND CHEMO.  PT  STATES HE HAS NOT SEEN KIDNEY SPECIALIST AT [facility] IN OVER A YEAR.  Marland Kitchen Chronic systolic heart failure ([place])    CARDIOLOGIST IS DR. Linard Millers  . CKD (chronic kidney disease), stage III ([place]) 12/08/2012  . Claustrophobia   . Diabetes ([place]) 04/27/2012  . Diabetes mellitus   . Diffuse large cell lymphoma in remission ([place]) 04/27/2012  . GERD (gastroesophageal reflux disease)    NOT TAKING ANY MEDS FOR REFLUX  . History of Lap Roux-en-Y gastric bypass 03/05/13 03/23/2013  .  Hyperlipidemia   . Hypertension   . IDDM (insulin dependent diabetes mellitus) 12/08/2012  . Morbid obesity ([place])   . Multiple thyroid nodules    HX OF NODULES=PT STATES BIOPSY-TOLD ALL OKAY  . Non-Hodgkin lymphoma ([place])    DX ABOUT 2007-TX'D WITH CHEMO-IN REMISSION  . Nonischemic cardiomyopathy ([place]) 11/25/2017  . OSA (obstructive sleep apnea) 12/08/2012  . Shortness of breath    WALKING UPSTAIRS    Past Surgical History:  Procedure Laterality Date  . BIOPSY  09/25/2019   Procedure: BIOPSY;  Surgeon:  Otis Brace, MD;  [facility];  Service: Gastroenterology;;  . COLONOSCOPY WITH PROPOFOL N/A 01/09/2013   Procedure: COLONOSCOPY WITH PROPOFOL;  Surgeon: Garlan Fair, MD;  [facility];  Service: Endoscopy;  Laterality: N/A;  . COLONOSCOPY WITH PROPOFOL N/A 09/25/2019   Procedure: COLONOSCOPY WITH PROPOFOL;  Surgeon: Otis Brace, MD;  [facility];  Service: Gastroenterology;  Laterality: N/A;  . GASTRIC ROUX-EN-Y N/A 03/05/2013   Procedure: LAPAROSCOPIC ROUX-EN-Y GASTRIC BYPASS WITH UPPER ENDOSCOPY;  Surgeon: Gayland Curry, MD;  [facility];  Service: General;  Laterality: N/A;  . HYDROCELE EXCISION  02/09/2011   Procedure: HYDROCELECTOMY ADULT;  Surgeon: Fredricka Bonine, MD;  [facility];  Service: Urology;  Laterality: Right;  . POLYPECTOMY  09/25/2019   Procedure: POLYPECTOMY;  Surgeon: Otis Brace, MD;  [facility];  Service: Gastroenterology;;    Current Medications: Current Meds  Medication Sig  . acetaminophen (TYLENOL) 500 MG tablet Take 1,000 mg by mouth every 6 (six) hours as needed for moderate pain. pain  . ALPRAZolam (XANAX) 0.5 MG tablet Take 0.5 mg by mouth 2 (two) times daily as needed for anxiety.   Marland Kitchen aspirin EC 81 MG tablet Take 1 tablet (81 mg total) by mouth daily.  . B Complex Vitamins (B COMPLEX 100 PO) Take 1 drop by mouth daily.  . calcium carbonate (OS-CAL - DOSED IN MG OF ELEMENTAL CALCIUM) 1250 (500 Ca) MG tablet Take 2 tablets by mouth 2 (two) times daily with a meal.   . carvedilol (COREG) 25 MG tablet TAKE 1 TABLET BY MOUTH TWICE A DAY  . dapagliflozin propanediol (FARXIGA) 5 MG TABS tablet Take 1 tablet (5 mg total) by mouth daily.  Marland Kitchen ENTRESTO 49-51 MG TAKE 1 TABLET BY MOUTH TWICE A DAY  . ferrous sulfate 325 (65 FE) MG tablet Take 325 mg by mouth 2 (two) times daily.   . furosemide (LASIX) 80 MG tablet Take 80 mg by mouth daily as needed for fluid.   Marland Kitchen glucose blood test strip 1  each by Other route as needed.   Marland Kitchen HUMALOG KWIKPEN 200 UNIT/ML KwikPen Inject 35 Units into the skin daily. 20-35 per meal  . LANTUS SOLOSTAR 100 UNIT/ML Solostar Pen Inject 70 Units into the skin daily.   . Multiple Vitamin (MULTIVITAMIN WITH MINERALS) TABS tablet Take 1 tablet by mouth 2 (two) times daily.   . ONE TOUCH ULTRA TEST test strip   . zolpidem (AMBIEN) 10 MG tablet Take 10 mg by mouth at bedtime as needed for sleep.      Allergies:   Aciphex [rabeprazole sodium], Acyclovir and related, and Nsaids   Social History   Socioeconomic History  . Marital status: Single    Spouse name: Not on file  . Number of children: Not on file  . Years of education: Not on file  . Highest education level: Not on file  Occupational History  . Not on  file  Tobacco Use  . Smoking status: Never Smoker  . Smokeless tobacco: Never Used  Substance and Sexual Activity  . Alcohol use: Yes    Comment: RARELY  . Drug use: No  . Sexual activity: Not on file  Other Topics Concern  . Not on file  Social History Narrative  . Not on file   Social Determinants of Health   Financial Resource Strain:   . Difficulty of Paying Living Expenses: Not on file  Food Insecurity:   . Worried About Charity fundraiser in the Last Year: Not on file  . Ran Out of Food in the Last Year: Not on file  Transportation Needs:   . Lack of Transportation (Medical): Not on file  . Lack of Transportation (Non-Medical): Not on file  Physical Activity:   . Days of Exercise per Week: Not on file  . Minutes of Exercise per Session: Not on file  Stress:   . Feeling of Stress : Not on file  Social Connections:   . Frequency of Communication with Friends and Family: Not on file  . Frequency of Social Gatherings with Friends and Family: Not on file  . Attends Religious Services: Not on file  . Active Member of Clubs or Organizations: Not on file  . Attends Archivist Meetings: Not on file  . Marital Status:  Not on file     Family History: The patient's family history is not on file. He was adopted.  ROS:   Please see the history of present illness.    All other systems reviewed and are negative.  EKGs/Labs/Other Studies Reviewed:    The following studies were reviewed today: I discussed my findings with the patient at extensive length.   Recent Labs: 02/21/2019: ALT 17; Hemoglobin 13.6; Platelets 256; TSH 0.360 07/23/2019: BUN 21; Creatinine, Ser 2.08; Potassium 4.5; Sodium 139  Recent Lipid Panel    Component Value Date/Time   CHOL 165 02/21/2019 0913   TRIG 116 02/21/2019 0913   HDL 57 02/21/2019 0913   CHOLHDL 2.9 02/21/2019 0913   CHOLHDL 3.2 08/07/2013 0815   VLDL 19 08/07/2013 0815   LDLCALC 87 02/21/2019 0913    Physical Exam:    VS:  BP 130/81   Pulse 76   Ht 6\' 1"  (1.854 m)   Wt (!) 372 lb 0.6 oz (168.8 kg)   SpO2 98%   BMI 49.08 kg/m     Wt Readings from Last 3 Encounters:  02/01/20 (!) 372 lb 0.6 oz (168.8 kg)  01/01/20 (!) 377 lb (171 kg)  09/25/19 (!) 365 lb (165.6 kg)     GEN: Patient is in no acute distress HEENT: Normal NECK: No JVD; No carotid bruits LYMPHATICS: No lymphadenopathy CARDIAC: Hear sounds regular, 2/6 systolic murmur at the apex. RESPIRATORY:  Clear to auscultation without rales, wheezing or rhonchi  ABDOMEN: Soft, non-tender, non-distended MUSCULOSKELETAL:  No edema; No deformity  SKIN: Warm and dry NEUROLOGIC:  Alert and oriented x 3 PSYCHIATRIC:  Normal affect   Signed, Jenean Lindau, MD  02/01/2020 8:28 AM    [place]

## 2020-04-12 ENCOUNTER — Other Ambulatory Visit: Payer: Self-pay | Admitting: Cardiology

## 2020-04-12 DIAGNOSIS — E1169 Type 2 diabetes mellitus with other specified complication: Secondary | ICD-10-CM

## 2020-04-12 DIAGNOSIS — I5022 Chronic systolic (congestive) heart failure: Secondary | ICD-10-CM

## 2020-04-12 DIAGNOSIS — N183 Chronic kidney disease, stage 3 unspecified: Secondary | ICD-10-CM

## 2020-04-16 ENCOUNTER — Ambulatory Visit: Payer: No Typology Code available for payment source | Admitting: Cardiology

## 2020-05-14 ENCOUNTER — Other Ambulatory Visit: Payer: Self-pay

## 2020-05-14 ENCOUNTER — Ambulatory Visit (INDEPENDENT_AMBULATORY_CARE_PROVIDER_SITE_OTHER): Payer: No Typology Code available for payment source | Admitting: Cardiology

## 2020-05-14 ENCOUNTER — Encounter: Payer: Self-pay | Admitting: Cardiology

## 2020-05-14 VITALS — BP 132/76 | HR 79 | Ht 73.0 in | Wt 380.0 lb

## 2020-05-14 DIAGNOSIS — Z86010 Personal history of colonic polyps: Secondary | ICD-10-CM | POA: Insufficient documentation

## 2020-05-14 DIAGNOSIS — E7800 Pure hypercholesterolemia, unspecified: Secondary | ICD-10-CM

## 2020-05-14 DIAGNOSIS — I5020 Unspecified systolic (congestive) heart failure: Secondary | ICD-10-CM

## 2020-05-14 DIAGNOSIS — E041 Nontoxic single thyroid nodule: Secondary | ICD-10-CM | POA: Insufficient documentation

## 2020-05-14 DIAGNOSIS — E1121 Type 2 diabetes mellitus with diabetic nephropathy: Secondary | ICD-10-CM | POA: Insufficient documentation

## 2020-05-14 DIAGNOSIS — K219 Gastro-esophageal reflux disease without esophagitis: Secondary | ICD-10-CM | POA: Insufficient documentation

## 2020-05-14 DIAGNOSIS — Z860101 Personal history of adenomatous and serrated colon polyps: Secondary | ICD-10-CM

## 2020-05-14 DIAGNOSIS — Z6841 Body Mass Index (BMI) 40.0 and over, adult: Secondary | ICD-10-CM | POA: Insufficient documentation

## 2020-05-14 DIAGNOSIS — E782 Mixed hyperlipidemia: Secondary | ICD-10-CM

## 2020-05-14 DIAGNOSIS — Z9884 Bariatric surgery status: Secondary | ICD-10-CM

## 2020-05-14 DIAGNOSIS — N433 Hydrocele, unspecified: Secondary | ICD-10-CM | POA: Insufficient documentation

## 2020-05-14 DIAGNOSIS — Z794 Long term (current) use of insulin: Secondary | ICD-10-CM

## 2020-05-14 DIAGNOSIS — D126 Benign neoplasm of colon, unspecified: Secondary | ICD-10-CM

## 2020-05-14 DIAGNOSIS — I10 Essential (primary) hypertension: Secondary | ICD-10-CM | POA: Diagnosis not present

## 2020-05-14 DIAGNOSIS — J310 Chronic rhinitis: Secondary | ICD-10-CM

## 2020-05-14 DIAGNOSIS — E669 Obesity, unspecified: Secondary | ICD-10-CM

## 2020-05-14 DIAGNOSIS — G4733 Obstructive sleep apnea (adult) (pediatric): Secondary | ICD-10-CM

## 2020-05-14 DIAGNOSIS — E038 Other specified hypothyroidism: Secondary | ICD-10-CM | POA: Insufficient documentation

## 2020-05-14 DIAGNOSIS — I428 Other cardiomyopathies: Secondary | ICD-10-CM

## 2020-05-14 DIAGNOSIS — M5431 Sciatica, right side: Secondary | ICD-10-CM

## 2020-05-14 DIAGNOSIS — R209 Unspecified disturbances of skin sensation: Secondary | ICD-10-CM

## 2020-05-14 DIAGNOSIS — N183 Chronic kidney disease, stage 3 unspecified: Secondary | ICD-10-CM

## 2020-05-14 DIAGNOSIS — E1169 Type 2 diabetes mellitus with other specified complication: Secondary | ICD-10-CM

## 2020-05-14 DIAGNOSIS — E291 Testicular hypofunction: Secondary | ICD-10-CM | POA: Insufficient documentation

## 2020-05-14 HISTORY — DX: Unspecified disturbances of skin sensation: R20.9

## 2020-05-14 HISTORY — DX: Benign neoplasm of colon, unspecified: D12.6

## 2020-05-14 HISTORY — DX: Testicular hypofunction: E29.1

## 2020-05-14 HISTORY — DX: Pure hypercholesterolemia, unspecified: E78.00

## 2020-05-14 HISTORY — DX: Obesity, unspecified: E66.9

## 2020-05-14 HISTORY — DX: Personal history of adenomatous and serrated colon polyps: Z86.0101

## 2020-05-14 HISTORY — DX: Bariatric surgery status: Z98.84

## 2020-05-14 HISTORY — DX: Hydrocele, unspecified: N43.3

## 2020-05-14 HISTORY — DX: Other specified hypothyroidism: E03.8

## 2020-05-14 HISTORY — DX: Chronic rhinitis: J31.0

## 2020-05-14 HISTORY — DX: Sciatica, right side: M54.31

## 2020-05-14 HISTORY — DX: Gastro-esophageal reflux disease without esophagitis: K21.9

## 2020-05-14 HISTORY — DX: Type 2 diabetes mellitus with diabetic nephropathy: E11.21

## 2020-05-14 HISTORY — DX: Long term (current) use of insulin: Z79.4

## 2020-05-14 HISTORY — DX: Personal history of colonic polyps: Z86.010

## 2020-05-14 HISTORY — DX: Nontoxic single thyroid nodule: E04.1

## 2020-05-14 HISTORY — DX: Body Mass Index (BMI) 40.0 and over, adult: Z684

## 2020-05-14 NOTE — Patient Instructions (Signed)
Medication Instructions:  No medication changes. *If you need a refill on your cardiac medications before your next appointment, please call your pharmacy*   Lab Work: Your physician recommends that you have a BMET today in the office.   Your physician recommends that you return for lab work in: before your next visit. You need to have labs done when you are fasting.  You can come Monday through Friday 8:30 am to 12:00 pm and 1:15 to 4:30. You do not need to make an appointment as the order has already been placed. The labs you are going to have done are BMET, CBC, TSH, LFT and Lipids.  If you have labs (blood work) drawn today and your tests are completely normal, you will receive your results only by: Marland Kitchen MyChart Message (if you have MyChart) OR . A paper copy in the mail If you have any lab test that is abnormal or we need to change your treatment, we will call you to review the results.   Testing/Procedures: Your physician has requested that you have an echocardiogram. Echocardiography is a painless test that uses sound waves to create images of your heart. It provides your doctor with information about the size and shape of your heart and how well your heart's chambers and valves are working. This procedure takes approximately one hour. There are no restrictions for this procedure.    Follow-Up: At [facility], you and your health needs are our priority.  As part of our continuing mission to provide you with exceptional heart care, we have created designated Provider Care Teams.  These Care Teams include your primary Cardiologist (physician) and Advanced Practice Providers (APPs -  Physician Assistants and Nurse Practitioners) who all work together to provide you with the care you need, when you need it.  We recommend signing up for the patient portal called "MyChart".  Sign up information is provided on this After Visit Summary.  MyChart is used to connect with patients for Virtual  Visits (Telemedicine).  Patients are able to view lab/test results, encounter notes, upcoming appointments, etc.  Non-urgent messages can be sent to your provider as well.   To learn more about what you can do with MyChart, go to [email or web address].    Your next appointment:   4 month(s)  The format for your next appointment:   In Person  Provider:   Jyl Heinz, MD   Other Instructions  Echocardiogram An echocardiogram is a procedure that uses painless sound waves (ultrasound) to produce an image of the heart. Images from an echocardiogram can provide important information about:  Signs of coronary artery disease (CAD).  Aneurysm detection. An aneurysm is a weak or damaged part of an artery wall that bulges out from the normal force of blood pumping through the body.  Heart size and shape. Changes in the size or shape of the heart can be associated with certain conditions, including heart failure, aneurysm, and CAD.  Heart muscle function.  Heart valve function.  Signs of a past heart attack.  Fluid buildup around the heart.  Thickening of the heart muscle.  A tumor or infectious growth around the heart valves. Tell a health care provider about:  Any allergies you have.  All medicines you are taking, including vitamins, herbs, eye drops, creams, and over-the-counter medicines.  Any blood disorders you have.  Any surgeries you have had.  Any medical conditions you have.  Whether you are pregnant or may be pregnant. What are the risks? Generally,  this is a safe procedure. However, problems may occur, including:  Allergic reaction to dye (contrast) that may be used during the procedure. What happens before the procedure? No specific preparation is needed. You may eat and drink normally. What happens during the procedure?    An IV tube may be inserted into one of your veins.  You may receive contrast through this tube. A contrast is an injection that  improves the quality of the pictures from your heart.  A gel will be applied to your chest.  A wand-like tool (transducer) will be moved over your chest. The gel will help to transmit the sound waves from the transducer.  The sound waves will harmlessly bounce off of your heart to allow the heart images to be captured in real-time motion. The images will be recorded on a computer. The procedure may vary among health care providers and hospitals. What happens after the procedure?  You may return to your normal, everyday life, including diet, activities, and medicines, unless your health care provider tells you not to do that. Summary  An echocardiogram is a procedure that uses painless sound waves (ultrasound) to produce an image of the heart.  Images from an echocardiogram can provide important information about the size and shape of your heart, heart muscle function, heart valve function, and fluid buildup around your heart.  You do not need to do anything to prepare before this procedure. You may eat and drink normally.  After the echocardiogram is completed, you may return to your normal, everyday life, unless your health care provider tells you not to do that. This information is not intended to replace advice given to you by your health care provider. Make sure you discuss any questions you have with your health care provider. Document Revised: 06/29/2018 Document Reviewed: 04/10/2016 Elsevier Patient Education  [place].

## 2020-05-14 NOTE — Progress Notes (Signed)
Cardiology Office Note:    Date:  05/14/2020   ID:  [name] Sosa, DOB 1960-06-23, MRN [id number]  PCP:  Lavone Orn, MD  Cardiologist:  Jenean Lindau, MD   Referring MD: Lavone Orn, MD    ASSESSMENT:    1. Nonischemic cardiomyopathy ([place])   2. Systolic congestive heart failure, unspecified HF chronicity ([place])   3. Mixed hyperlipidemia   4. Primary hypertension   5. Obstructive sleep apnea   6. Morbid obesity ([place])    PLAN:    In order of problems listed above:  1. Primary prevention stressed with the patient.  Importance of compliance with diet medication stressed any vocalized understanding.  I told him to exercise at least half an hour a day 5 days a week and he promises to do so. 2. Nonischemic cardiomyopathy: Stable at this time.  Ejection fraction 30 to 35%.  Patient on guideline directed medical therapy.  He is not currently keen on EP evaluation for defibrillator.  This has been addressed also by our EP team. 3. Essential hypertension: Blood pressure stable and diet was emphasized. 4. Mixed dyslipidemia: On statin therapy and tolerating well. 5. Morbid obesity: Risks of obesity explained he promises to do better this time.  Patient is on multiple medications and will have a Chem-7 today. 6. I will get him for follow-up appointment in 4 months.  Before this I will do complete blood work and echocardiogram to assess the effectiveness of guideline directed medical therapy and hopefully his active involvement in his home health which will involve eating right exercising and losing weight in addition to his medical therapy.   Medication Adjustments/Labs and Tests Ordered: Current medicines are reviewed at length with the patient today.  Concerns regarding medicines are outlined above.  No orders of the defined types were placed in this encounter.  No orders of the defined types were placed in this encounter.    No chief complaint on file.    History of  Present Illness:    [name] Sosa is a 60 y.o. male.  Patient has past medical history of essential hypertension, dyslipidemia, morbid obesity post bariatric surgery, nonischemic cardiomyopathy.  He denies any problems at this time and takes care of activities of daily living.  No chest pain orthopnea or PND.  He leads a sedentary lifestyle and has gained significant amount of weight since last evaluation.  He acknowledges this.  At the time of my evaluation, the patient is alert awake oriented and in no distress.  Past Medical History:  Diagnosis Date  . Anemia   . Anxiety    HX OF ANXIETY ATTACKS--CAN'T SLEEP ON BACK-FEELS LIKE HE CAN'T BREATHE  . Apnea, sleep    SEVERE OSA PER STUDY 12/01/12  . Cancer ([place])    HX OF NON HODGKIN'S LYMPHOMA -IN REMISSION  . Cardiomyopathy ([place]) 04/27/2018  . Cardiomyopathy, nonischemic ([place])    DX 2003 CARDIAC CATH, ECHO 2006 SHOWED EF IMPROVED TO 45-50%--PER CARDIOLOGY OFFICE NOTES DR. Linard Millers FROM 01/20/10  . CHF (congestive heart failure) ([place]) 2003  . Chronic kidney disease    HX OF CHRONIC KIDNEY DISEASE STAGE III - EVALUATED AT [facility] IN THE PAST AND FELT TO BE RELATED TO NSAID'S AND CHEMO.  PT  STATES HE HAS NOT SEEN KIDNEY SPECIALIST AT [facility] IN OVER A YEAR.  Marland Kitchen Chronic systolic heart failure ([place])    CARDIOLOGIST IS DR. Linard Millers  . CKD (chronic kidney disease), stage III ([place]) 12/08/2012  .  Claustrophobia   . Diabetes ([place]) 04/27/2012  . Diabetes mellitus   . Diffuse large cell lymphoma in remission ([place]) 04/27/2012  . GERD (gastroesophageal reflux disease)    NOT TAKING ANY MEDS FOR REFLUX  . History of Lap Roux-en-Y gastric bypass 03/05/13 03/23/2013  . Hyperlipidemia   . Hypertension   . IDDM (insulin dependent diabetes mellitus) 12/08/2012  . Morbid obesity ([place])   . Multiple thyroid nodules    HX OF NODULES=PT STATES BIOPSY-TOLD ALL OKAY  . Non-Hodgkin lymphoma ([place])    DX ABOUT 2007-TX'D WITH CHEMO-IN REMISSION  . Nonischemic  cardiomyopathy ([place]) 11/25/2017  . OSA (obstructive sleep apnea) 12/08/2012  . Shortness of breath    WALKING UPSTAIRS    Past Surgical History:  Procedure Laterality Date  . BIOPSY  09/25/2019   Procedure: BIOPSY;  Surgeon: Otis Brace, MD;  Location: [facility];  Service: Gastroenterology;;  . COLONOSCOPY WITH PROPOFOL N/A 01/09/2013   Procedure: COLONOSCOPY WITH PROPOFOL;  Surgeon: Garlan Fair, MD;  Location: [facility];  Service: Endoscopy;  Laterality: N/A;  . COLONOSCOPY WITH PROPOFOL N/A 09/25/2019   Procedure: COLONOSCOPY WITH PROPOFOL;  Surgeon: Otis Brace, MD;  Location: [facility];  Service: Gastroenterology;  Laterality: N/A;  . GASTRIC ROUX-EN-Y N/A 03/05/2013   Procedure: LAPAROSCOPIC ROUX-EN-Y GASTRIC BYPASS WITH UPPER ENDOSCOPY;  Surgeon: Gayland Curry, MD;  Location: [facility];  Service: General;  Laterality: N/A;  . HYDROCELE EXCISION  02/09/2011   Procedure: HYDROCELECTOMY ADULT;  Surgeon: Fredricka Bonine, MD;  Location: [facility];  Service: Urology;  Laterality: Right;  . POLYPECTOMY  09/25/2019   Procedure: POLYPECTOMY;  Surgeon: Otis Brace, MD;  Location: [facility];  Service: Gastroenterology;;    Current Medications: Current Meds  Medication Sig  . acetaminophen (TYLENOL) 500 MG tablet Take 1,000 mg by mouth every 6 (six) hours as needed for moderate pain. pain  . ALPRAZolam (XANAX) 0.5 MG tablet Take 0.5 mg by mouth 2 (two) times daily as needed for anxiety.  Marland Kitchen aspirin EC 81 MG tablet Take 1 tablet (81 mg total) by mouth daily.  . B Complex Vitamins (B COMPLEX 100 PO) Take 1 drop by mouth daily.  . calcium carbonate (OS-CAL - DOSED IN MG OF ELEMENTAL CALCIUM) 1250 (500 Ca) MG tablet Take 2 tablets by mouth 2 (two) times a week.  . carvedilol (COREG) 25 MG tablet TAKE 1 TABLET BY MOUTH TWICE A DAY  . ENTRESTO 49-51 MG TAKE 1 TABLET BY MOUTH TWICE A DAY  . FARXIGA 5 MG TABS tablet TAKE 1 TABLET BY MOUTH EVERY DAY  . ferrous sulfate 325  (65 FE) MG tablet Take 325 mg by mouth 2 (two) times daily.  . furosemide (LASIX) 80 MG tablet Take 80 mg by mouth daily as needed for fluid.   Marland Kitchen HUMALOG KWIKPEN 200 UNIT/ML KwikPen Inject 35 Units into the skin daily. 20-35 per meal  . LANTUS SOLOSTAR 100 UNIT/ML Solostar Pen Inject 80 Units into the skin daily.  . Multiple Vitamin (MULTIVITAMIN WITH MINERALS) TABS tablet Take 1 tablet by mouth 2 (two) times daily.   . rosuvastatin (CRESTOR) 5 MG tablet Take 5 mg by mouth 2 (two) times a week.  . zolpidem (AMBIEN) 10 MG tablet Take 10 mg by mouth at bedtime as needed for sleep.      Allergies:   Aciphex [rabeprazole sodium], Aciphex [rabeprazole], Acyclovir and related, and Nsaids   Social History   Socioeconomic History  . Marital status: Single    Spouse  name: Not on file  . Number of children: Not on file  . Years of education: Not on file  . Highest education level: Not on file  Occupational History  . Not on file  Tobacco Use  . Smoking status: Never Smoker  . Smokeless tobacco: Never Used  Substance and Sexual Activity  . Alcohol use: Yes    Comment: RARELY  . Drug use: No  . Sexual activity: Not on file  Other Topics Concern  . Not on file  Social History Narrative  . Not on file   Social Determinants of Health   Financial Resource Strain: Not on file  Food Insecurity: Not on file  Transportation Needs: Not on file  Physical Activity: Not on file  Stress: Not on file  Social Connections: Not on file     Family History: The patient's family history is not on file. He was adopted.  ROS:   Please see the history of present illness.    All other systems reviewed and are negative.  EKGs/Labs/Other Studies Reviewed:    The following studies were reviewed today: IMPRESSIONS    1. Left ventricular ejection fraction, by estimation, is 30 to 35%. The  left ventricle has severely decreased function. The left ventricle  demonstrates global hypokinesis. The left  ventricular internal cavity size  was mildly dilated. Left ventricular  diastolic parameters are consistent with Grade I diastolic dysfunction  (impaired relaxation).  2. Right ventricular systolic function was not well visualized. The right  ventricular size is normal.  3. The mitral valve is normal in structure. No evidence of mitral valve  regurgitation. No evidence of mitral stenosis.  4. The aortic valve is tricuspid. Aortic valve regurgitation is not  visualized. No aortic stenosis is present.  5. The inferior vena cava is normal in size with greater than 50%  respiratory variability, suggesting right atrial pressure of 3 mmHg.    Recent Labs: 07/23/2019: BUN 21; Creatinine, Ser 2.08; Potassium 4.5; Sodium 139  Recent Lipid Panel    Component Value Date/Time   CHOL 165 02/21/2019 0913   TRIG 116 02/21/2019 0913   HDL 57 02/21/2019 0913   CHOLHDL 2.9 02/21/2019 0913   CHOLHDL 3.2 08/07/2013 0815   VLDL 19 08/07/2013 0815   LDLCALC 87 02/21/2019 0913    Physical Exam:    VS:  BP 132/76   Pulse 79   Ht 6\' 1"  (1.854 m)   Wt (!) 380 lb 0.6 oz (172.4 kg)   SpO2 97%   BMI 50.14 kg/m     Wt Readings from Last 3 Encounters:  05/14/20 (!) 380 lb 0.6 oz (172.4 kg)  02/01/20 (!) 372 lb 0.6 oz (168.8 kg)  01/01/20 (!) 377 lb (171 kg)     GEN: Patient is in no acute distress HEENT: Normal NECK: No JVD; No carotid bruits LYMPHATICS: No lymphadenopathy CARDIAC: Hear sounds regular, 2/6 systolic murmur at the apex. RESPIRATORY:  Clear to auscultation without rales, wheezing or rhonchi  ABDOMEN: Soft, non-tender, non-distended MUSCULOSKELETAL:  No edema; No deformity  SKIN: Warm and dry NEUROLOGIC:  Alert and oriented x 3 PSYCHIATRIC:  Normal affect   Signed, Jenean Lindau, MD  05/14/2020 2:22 PM    [facility]

## 2020-05-15 LAB — BASIC METABOLIC PANEL
BUN/Creatinine Ratio: 13 (ref 10–24)
BUN: 25 mg/dL (ref 8–27)
CO2: 20 mmol/L (ref 20–29)
Calcium: 9.1 mg/dL (ref 8.6–10.2)
Chloride: 102 mmol/L (ref 96–106)
Creatinine, Ser: 1.97 mg/dL — ABNORMAL HIGH (ref 0.76–1.27)
GFR calc Af Amer: 41 mL/min/{1.73_m2} — ABNORMAL LOW (ref >59)
GFR calc non Af Amer: 36 mL/min/{1.73_m2} — ABNORMAL LOW (ref >59)
Glucose: 91 mg/dL (ref 65–99)
Potassium: 4.9 mmol/L (ref 3.5–5.2)
Sodium: 137 mmol/L (ref 134–144)

## 2020-06-06 ENCOUNTER — Other Ambulatory Visit: Payer: Self-pay | Admitting: Internal Medicine

## 2020-06-06 DIAGNOSIS — R29898 Other symptoms and signs involving the musculoskeletal system: Secondary | ICD-10-CM

## 2020-06-28 ENCOUNTER — Ambulatory Visit
Admission: RE | Admit: 2020-06-28 | Discharge: 2020-06-28 | Disposition: A | Discharge status: Home / Self Care | Payer: No Typology Code available for payment source | Source: Ambulatory Visit | Attending: Internal Medicine | Admitting: Internal Medicine

## 2020-06-28 ENCOUNTER — Other Ambulatory Visit: Payer: Self-pay

## 2020-06-28 DIAGNOSIS — R29898 Other symptoms and signs involving the musculoskeletal system: Secondary | ICD-10-CM

## 2020-07-05 ENCOUNTER — Other Ambulatory Visit: Payer: Self-pay | Admitting: Cardiology

## 2020-07-07 NOTE — Telephone Encounter (Signed)
Carvedilol 25 mg # 180 tablets and Entresto 49-51 mg 180 tabletsl to pharmacy with 2 additional refills

## 2020-07-27 ENCOUNTER — Other Ambulatory Visit: Payer: Self-pay | Admitting: Cardiology

## 2020-07-27 DIAGNOSIS — E1169 Type 2 diabetes mellitus with other specified complication: Secondary | ICD-10-CM

## 2020-07-27 DIAGNOSIS — I5022 Chronic systolic (congestive) heart failure: Secondary | ICD-10-CM

## 2020-07-27 DIAGNOSIS — Z794 Long term (current) use of insulin: Secondary | ICD-10-CM

## 2020-07-27 DIAGNOSIS — N183 Chronic kidney disease, stage 3 unspecified: Secondary | ICD-10-CM

## 2020-09-08 ENCOUNTER — Other Ambulatory Visit (HOSPITAL_BASED_OUTPATIENT_CLINIC_OR_DEPARTMENT_OTHER): Payer: No Typology Code available for payment source

## 2020-09-10 ENCOUNTER — Other Ambulatory Visit (HOSPITAL_BASED_OUTPATIENT_CLINIC_OR_DEPARTMENT_OTHER): Payer: No Typology Code available for payment source

## 2020-09-15 ENCOUNTER — Ambulatory Visit: Payer: No Typology Code available for payment source | Admitting: Cardiology

## 2020-09-19 ENCOUNTER — Other Ambulatory Visit: Payer: Self-pay

## 2020-09-19 ENCOUNTER — Ambulatory Visit (HOSPITAL_BASED_OUTPATIENT_CLINIC_OR_DEPARTMENT_OTHER)
Admission: RE | Admit: 2020-09-19 | Discharge: 2020-09-19 | Disposition: A | Discharge status: Home / Self Care | Payer: No Typology Code available for payment source | Source: Ambulatory Visit | Attending: Cardiology | Admitting: Cardiology

## 2020-09-19 DIAGNOSIS — I428 Other cardiomyopathies: Secondary | ICD-10-CM | POA: Diagnosis present

## 2020-09-19 LAB — ECHOCARDIOGRAM COMPLETE
AV Mean grad: 4 mmHg
AV Peak grad: 7.2 mmHg
Ao pk vel: 1.34 m/s
Area-P 1/2: 3.27 cm2
Calc EF: 42.8 %
S' Lateral: 5.82 cm
Single Plane A2C EF: 48.1 %
Single Plane A4C EF: 39.5 %

## 2020-09-19 NOTE — Progress Notes (Signed)
*  PRELIMINARY RESULTS* Echocardiogram 2D Echocardiogram has been performed.  Luisa Hart RDCS 09/19/2020, 8:58 AM

## 2020-10-06 ENCOUNTER — Other Ambulatory Visit: Payer: Self-pay

## 2020-10-07 ENCOUNTER — Other Ambulatory Visit: Payer: Self-pay

## 2020-10-07 ENCOUNTER — Encounter: Payer: Self-pay | Admitting: Cardiology

## 2020-10-07 ENCOUNTER — Ambulatory Visit (INDEPENDENT_AMBULATORY_CARE_PROVIDER_SITE_OTHER): Payer: No Typology Code available for payment source | Admitting: Cardiology

## 2020-10-07 VITALS — BP 126/76 | HR 74 | Ht 73.0 in | Wt 371.0 lb

## 2020-10-07 DIAGNOSIS — G4733 Obstructive sleep apnea (adult) (pediatric): Secondary | ICD-10-CM | POA: Diagnosis not present

## 2020-10-07 DIAGNOSIS — I10 Essential (primary) hypertension: Secondary | ICD-10-CM | POA: Diagnosis not present

## 2020-10-07 DIAGNOSIS — E782 Mixed hyperlipidemia: Secondary | ICD-10-CM

## 2020-10-07 DIAGNOSIS — I428 Other cardiomyopathies: Secondary | ICD-10-CM

## 2020-10-07 NOTE — Progress Notes (Signed)
Cardiology Office Note:    Date:  10/07/2020   ID:  HULAN SZUMSKI, DOB 1960/04/25, MRN [id number]  PCP:  Lavone Orn, MD  Cardiologist:  Jenean Lindau, MD   Referring MD: Lavone Orn, MD    ASSESSMENT:    1. Nonischemic cardiomyopathy ([place])   2. Mixed hyperlipidemia   3. Primary hypertension   4. Obstructive sleep apnea   5. Morbid obesity ([place])    PLAN:    In order of problems listed above:  Primary prevention stressed with the patient.  Importance of compliance with diet medication stressed any vocalized understanding. Essential hypertension: Blood pressure stable and diet was emphasized. Advanced cardiomyopathy with ejection fraction of 30 to 35%.  Stable at this time.  Patient is on guideline directed medical therapy.  He has not been on evaluations by our electrophysiology colleagues and he is being pretty set with his mindset on this issue.  I respect his wishes. Renal insufficiency: We will check his Chem-7 today.  He is tolerating medications well.  I also discussed with the patient about salt intake issues and regular weight checks and he is agreeable. Orbit obesity: He is doing his best to lose weight and he has lost 7 pounds in the past few weeks. Patient will be seen in follow-up appointment in 4 months or earlier if the patient has any concerns    Medication Adjustments/Labs and Tests Ordered: Current medicines are reviewed at length with the patient today.  Concerns regarding medicines are outlined above.  No orders of the defined types were placed in this encounter.  No orders of the defined types were placed in this encounter.    No chief complaint on file.    History of Present Illness:    [name] TORREZ is a 60 y.o. male.  Patient has past medical history of nonischemic cardiomyopathy, essential hypertension, dyslipidemia and morbid obesity.  He is doing his best to lose weight.  He denies any chest pain orthopnea or PND.  At the time of my  evaluation, the patient is alert awake oriented and in no distress.  Past Medical History:  Diagnosis Date   Adenomatous polyp of colon 05/14/2020   Anemia    Anxiety    HX OF ANXIETY ATTACKS--CAN'T SLEEP ON BACK-FEELS LIKE HE CAN'T BREATHE   Apnea, sleep    SEVERE OSA PER STUDY 12/01/12   Bariatric surgery status 05/14/2020   Body mass index (BMI) 45.0-49.9, adult ([place]) 05/14/2020   Cancer ([place])    HX OF NON HODGKIN'S LYMPHOMA -IN REMISSION   Cardiomyopathy ([place]) 04/27/2018   Cardiomyopathy, nonischemic ([place])    DX 2003 CARDIAC CATH, ECHO 2006 SHOWED EF IMPROVED TO 45-50%--PER CARDIOLOGY OFFICE NOTES DR. Linard Millers FROM 01/20/10   CHF (congestive heart failure) ([place]) 2003   Chronic kidney disease    HX OF CHRONIC KIDNEY DISEASE STAGE III - EVALUATED AT [facility] IN THE PAST AND FELT TO BE RELATED TO NSAID'S AND CHEMO.  PT  STATES HE HAS NOT SEEN KIDNEY SPECIALIST AT [facility] IN OVER A YEAR.   Chronic rhinitis 3/53/2992   Chronic systolic heart failure (HCC)    CARDIOLOGIST IS DR. Linard Millers   CKD (chronic kidney disease), stage III ([place]) 12/08/2012   Claustrophobia    Diabetes ([place]) 04/27/2012   Diabetes mellitus    Diabetic renal disease ([place]) 05/14/2020   Diffuse large cell lymphoma in remission ([place]) 04/27/2012   Gastro-esophageal reflux disease without esophagitis 05/14/2020   GERD (gastroesophageal reflux disease)  NOT TAKING ANY MEDS FOR REFLUX   History of adenomatous polyp of colon 05/14/2020   History of Lap Roux-en-Y gastric bypass 03/05/13 03/23/2013   Hydrocele 05/14/2020   Hyperlipidemia    Hypertension    IDDM (insulin dependent diabetes mellitus) 12/08/2012   Long term (current) use of insulin ([place]) 05/14/2020   Morbid obesity ([place])    Multiple thyroid nodules    HX OF NODULES=PT STATES BIOPSY-TOLD ALL OKAY   Non-Hodgkin lymphoma ([place])    DX ABOUT 2007-TX'D WITH CHEMO-IN REMISSION   Nonischemic cardiomyopathy ([place]) 11/25/2017   Obesity 05/14/2020   Obstructive sleep  apnea 12/08/2012   Pure hypercholesterolemia 05/14/2020   Sciatica, right side 05/14/2020   Shortness of breath    WALKING UPSTAIRS   Skin sensation disturbance 05/14/2020   Subclinical hypothyroidism 05/14/2020   Testicular hypofunction 05/14/2020   Thyroid nodule 05/14/2020    Past Surgical History:  Procedure Laterality Date   BIOPSY  09/25/2019   Procedure: BIOPSY;  Surgeon: Otis Brace, MD;  Location: [facility];  Service: Gastroenterology;;   COLONOSCOPY WITH PROPOFOL N/A 01/09/2013   Procedure: COLONOSCOPY WITH PROPOFOL;  Surgeon: Garlan Fair, MD;  Location: [facility];  Service: Endoscopy;  Laterality: N/A;   COLONOSCOPY WITH PROPOFOL N/A 09/25/2019   Procedure: COLONOSCOPY WITH PROPOFOL;  Surgeon: Otis Brace, MD;  Location: [facility];  Service: Gastroenterology;  Laterality: N/A;   GASTRIC ROUX-EN-Y N/A 03/05/2013   Procedure: LAPAROSCOPIC ROUX-EN-Y GASTRIC BYPASS WITH UPPER ENDOSCOPY;  Surgeon: Gayland Curry, MD;  Location: [facility];  Service: General;  Laterality: N/A;   HYDROCELE EXCISION  02/09/2011   Procedure: HYDROCELECTOMY ADULT;  Surgeon: Fredricka Bonine, MD;  Location: [facility];  Service: Urology;  Laterality: Right;   POLYPECTOMY  09/25/2019   Procedure: POLYPECTOMY;  Surgeon: Otis Brace, MD;  [facility]: [facility];  Service: Gastroenterology;;    Current Medications: Current Meds  Medication Sig   acetaminophen (TYLENOL) 500 MG tablet Take 1,000 mg by mouth every 6 (six) hours as needed for moderate pain. pain   ALPRAZolam (XANAX) 0.5 MG tablet Take 0.5 mg by mouth 2 (two) times daily as needed for anxiety.   aspirin EC 81 MG tablet Take 1 tablet (81 mg total) by mouth daily.   calcium carbonate (OS-CAL - DOSED IN MG OF ELEMENTAL CALCIUM) 1250 (500 Ca) MG tablet Take 2 tablets by mouth 2 (two) times a week.   carvedilol (COREG) 25 MG tablet Take 25 mg by mouth 2 (two) times daily with a meal.   dapagliflozin propanediol (FARXIGA) 5 MG  TABS tablet Take 5 mg by mouth daily.   ferrous sulfate 325 (65 FE) MG tablet Take 325 mg by mouth 2 (two) times daily.   furosemide (LASIX) 80 MG tablet Take 80 mg by mouth daily as needed for fluid.    HUMALOG KWIKPEN 200 UNIT/ML KwikPen Inject 35 Units into the skin daily. 20-35 per meal   Insulin Glargine-yfgn 100 UNIT/ML SOPN Inject 65 Units into the skin daily.   LANTUS SOLOSTAR 100 UNIT/ML Solostar Pen Inject 80 Units into the skin daily.   Multiple Vitamin (MULTIVITAMIN WITH MINERALS) TABS tablet Take 1 tablet by mouth 2 (two) times daily.    rosuvastatin (CRESTOR) 5 MG tablet Take 5 mg by mouth 2 (two) times a week.   sacubitril-valsartan (ENTRESTO) 49-51 MG Take 1 tablet by mouth 2 (two) times daily.   zolpidem (AMBIEN) 10 MG tablet Take 10 mg by mouth at bedtime as needed for sleep.  Allergies:   Aciphex [rabeprazole sodium], Acyclovir and related, and Nsaids   Social History   Socioeconomic History   Marital status: Single    Spouse name: Not on file   Number of children: Not on file   Years of education: Not on file   Highest education level: Not on file  Occupational History   Not on file  Tobacco Use   Smoking status: Never   Smokeless tobacco: Never  Substance and Sexual Activity   Alcohol use: Yes    Comment: RARELY   Drug use: No   Sexual activity: Not on file  Other Topics Concern   Not on file  Social History Narrative   Not on file   Social Determinants of Health   Financial Resource Strain: Not on file  Food Insecurity: Not on file  Transportation Needs: Not on file  Physical Activity: Not on file  Stress: Not on file  Social Connections: Not on file     Family History: The patient's He was adopted. Family history is unknown by patient.  ROS:   Please see the history of present illness.    All other systems reviewed and are negative.  EKGs/Labs/Other Studies Reviewed:    The following studies were reviewed today: IMPRESSIONS     1.  Left ventricular ejection fraction, by estimation, is 30 to 35%. The  left ventricle has moderately decreased function. The left ventricle has  no regional wall motion abnormalities. Left ventricular diastolic  parameters are consistent with Grade II  diastolic dysfunction (pseudonormalization).    Recent Labs: 05/14/2020: BUN 25; Creatinine, Ser 1.97; Potassium 4.9; Sodium 137  Recent Lipid Panel    Component Value Date/Time   CHOL 165 02/21/2019 0913   TRIG 116 02/21/2019 0913   HDL 57 02/21/2019 0913   CHOLHDL 2.9 02/21/2019 0913   CHOLHDL 3.2 08/07/2013 0815   VLDL 19 08/07/2013 0815   LDLCALC 87 02/21/2019 0913    Physical Exam:    VS:  BP 126/76   Pulse 74   Ht 6\' 1"  (1.854 m)   Wt (!) 371 lb 0.6 oz (168.3 kg)   SpO2 95%   BMI 48.95 kg/m     Wt Readings from Last 3 Encounters:  10/07/20 (!) 371 lb 0.6 oz (168.3 kg)  05/14/20 (!) 380 lb 0.6 oz (172.4 kg)  02/01/20 (!) 372 lb 0.6 oz (168.8 kg)     GEN: Patient is in no acute distress HEENT: Normal NECK: No JVD; No carotid bruits LYMPHATICS: No lymphadenopathy CARDIAC: Hear sounds regular, 2/6 systolic murmur at the apex. RESPIRATORY:  Clear to auscultation without rales, wheezing or rhonchi  ABDOMEN: Soft, non-tender, non-distended MUSCULOSKELETAL:  No edema; No deformity  SKIN: Warm and dry NEUROLOGIC:  Alert and oriented x 3 PSYCHIATRIC:  Normal affect   Signed, Jenean Lindau, MD  10/07/2020 4:12 PM    [facility]

## 2020-10-07 NOTE — Addendum Note (Signed)
Addended by: Truddie Hidden on: 10/07/2020 04:23 PM   Modules accepted: Orders

## 2020-10-07 NOTE — Patient Instructions (Signed)
Medication Instructions:  No medication changes. *If you need a refill on your cardiac medications before your next appointment, please call your pharmacy*   Lab Work: Your physician recommends that you have a BMET done today in the office.  If you have labs (blood work) drawn today and your tests are completely normal, you will receive your results only by: [place] (if you have MyChart) OR A paper copy in the mail If you have any lab test that is abnormal or we need to change your treatment, we will call you to review the results.   Testing/Procedures: None ordered   Follow-Up: At [facility], you and your health needs are our priority.  As part of our continuing mission to provide you with exceptional heart care, we have created designated Provider Care Teams.  These Care Teams include your primary Cardiologist (physician) and Advanced Practice Providers (APPs -  Physician Assistants and Nurse Practitioners) who all work together to provide you with the care you need, when you need it.  We recommend signing up for the patient portal called "MyChart".  Sign up information is provided on this After Visit Summary.  MyChart is used to connect with patients for Virtual Visits (Telemedicine).  Patients are able to view lab/test results, encounter notes, upcoming appointments, etc.  Non-urgent messages can be sent to your provider as well.   To learn more about what you can do with MyChart, go to [email or web address].    Your next appointment:   4 month(s)  The format for your next appointment:   In Person  Provider:   Jyl Heinz, MD   Other Instructions NA

## 2020-10-08 LAB — BASIC METABOLIC PANEL
BUN/Creatinine Ratio: 14 (ref 10–24)
BUN: 26 mg/dL (ref 8–27)
CO2: 22 mmol/L (ref 20–29)
Calcium: 8.9 mg/dL (ref 8.6–10.2)
Chloride: 101 mmol/L (ref 96–106)
Creatinine, Ser: 1.91 mg/dL — ABNORMAL HIGH (ref 0.76–1.27)
Glucose: 115 mg/dL — ABNORMAL HIGH (ref 65–99)
Potassium: 4.6 mmol/L (ref 3.5–5.2)
Sodium: 137 mmol/L (ref 134–144)
eGFR: 39 mL/min/{1.73_m2} — ABNORMAL LOW (ref >59)

## 2021-02-10 ENCOUNTER — Ambulatory Visit: Payer: No Typology Code available for payment source | Admitting: Cardiology

## 2021-04-02 ENCOUNTER — Encounter: Payer: Self-pay | Admitting: Cardiology

## 2021-04-02 ENCOUNTER — Ambulatory Visit (INDEPENDENT_AMBULATORY_CARE_PROVIDER_SITE_OTHER): Payer: BC Managed Care – PPO | Admitting: Cardiology

## 2021-04-02 ENCOUNTER — Other Ambulatory Visit: Payer: Self-pay

## 2021-04-02 VITALS — BP 120/76 | HR 74 | Ht 72.0 in | Wt 349.1 lb

## 2021-04-02 DIAGNOSIS — I428 Other cardiomyopathies: Secondary | ICD-10-CM | POA: Diagnosis not present

## 2021-04-02 DIAGNOSIS — M1610 Unilateral primary osteoarthritis, unspecified hip: Secondary | ICD-10-CM

## 2021-04-02 DIAGNOSIS — M47816 Spondylosis without myelopathy or radiculopathy, lumbar region: Secondary | ICD-10-CM

## 2021-04-02 DIAGNOSIS — I5020 Unspecified systolic (congestive) heart failure: Secondary | ICD-10-CM | POA: Diagnosis not present

## 2021-04-02 HISTORY — DX: Spondylosis without myelopathy or radiculopathy, lumbar region: M47.816

## 2021-04-02 HISTORY — DX: Unilateral primary osteoarthritis, unspecified hip: M16.10

## 2021-04-02 NOTE — Progress Notes (Signed)
Cardiology Office Note:    Date:  04/02/2021   ID:  [name] Sosa, DOB Aug 02, 1960, MRN [id number]  PCP:  Lavone Orn, MD  Cardiologist:  Jenean Lindau, MD   Referring MD: Lavone Orn, MD    ASSESSMENT:    1. Cardiomyopathy, nonischemic ([place])   2. Systolic congestive heart failure, unspecified HF chronicity ([place])   3. Morbid obesity ([place])    PLAN:    In order of problems listed above:  Primary prevention stressed with the patient.  Importance of compliance with diet medication stressed and vocalized understanding. Essential hypertension: Blood pressure stable and diet was emphasized.  Lifestyle modification urged. Cardiomyopathy: Echocardiogram report was discussed and he is on guideline directed medical therapy. Morbid obesity: Weight reduction stressed and he promises to do better.  He is working very hard cutting down on his carbs and eating well to lose weight.  He has been successful to a good extent and I congratulated him about this. Diabetes mellitus and renal insufficiency: These issues were discussed with the patient.  These are followed by primary care. Patient will be seen in follow-up appointment in 6 months or earlier if the patient has any concerns.  I told him that if he needs hip surgery I would consider getting a stress test done for restratification in view of multiple risk factors and is agreeable.  At the appropriate time he will contact me for this.   Medication Adjustments/Labs and Tests Ordered: Current medicines are reviewed at length with the patient today.  Concerns regarding medicines are outlined above.  No orders of the defined types were placed in this encounter.  No orders of the defined types were placed in this encounter.    No chief complaint on file.    History of Present Illness:    [name] Sosa is a 61 y.o. male.  Patient has past medical history of cardiomyopathy, essential hypertension dyslipidemia sleep apnea and morbid  obesity.  He denies any problems at this time and takes care of activities of daily living.  No chest pain orthopnea or PND.  He has significant hip issues and in the future is contemplating hip replacement surgery.  At the time of my evaluation, the patient is alert awake oriented and in no distress.  Before that he plans to lose significant amount of weight getting ready for the surgery.  Past Medical History:  Diagnosis Date   Adenomatous polyp of colon 05/14/2020   Anemia    Anxiety    HX OF ANXIETY ATTACKS--CAN'T SLEEP ON BACK-FEELS LIKE HE CAN'T BREATHE   Apnea, sleep    SEVERE OSA PER STUDY 12/01/12   Bariatric surgery status 05/14/2020   Body mass index (BMI) 45.0-49.9, adult ([place]) 05/14/2020   Cancer ([place])    HX OF NON HODGKIN'S LYMPHOMA -IN REMISSION   Cardiomyopathy ([place]) 04/27/2018   Cardiomyopathy, nonischemic ([place])    DX 2003 CARDIAC CATH, ECHO 2006 SHOWED EF IMPROVED TO 45-50%--PER CARDIOLOGY OFFICE NOTES DR. Linard Millers FROM 01/20/10   CHF (congestive heart failure) ([place]) 2003   Chronic kidney disease    HX OF CHRONIC KIDNEY DISEASE STAGE III - EVALUATED AT [facility] IN THE PAST AND FELT TO BE RELATED TO NSAID'S AND CHEMO.  PT  STATES HE HAS NOT SEEN KIDNEY SPECIALIST AT [facility] IN OVER A YEAR.   Chronic rhinitis 63/84/6659   Chronic systolic heart failure (HCC)    CARDIOLOGIST IS DR. Linard Millers   CKD (chronic kidney disease), stage III ([place])  12/08/2012   Claustrophobia    Diabetes ([place]) 04/27/2012   Diabetic renal disease ([place]) 05/14/2020   Diffuse large cell lymphoma in remission ([place]) 04/27/2012   Gastro-esophageal reflux disease without esophagitis 05/14/2020   GERD (gastroesophageal reflux disease)    NOT TAKING ANY MEDS FOR REFLUX   History of adenomatous polyp of colon 05/14/2020   History of Lap Roux-en-Y gastric bypass 03/05/13 03/23/2013   Hydrocele 05/14/2020   Hyperlipidemia    Hypertension    IDDM (insulin dependent diabetes mellitus) 12/08/2012   Long term  (current) use of insulin ([place]) 05/14/2020   Morbid obesity ([place])    Multiple thyroid nodules    HX OF NODULES=PT STATES BIOPSY-TOLD ALL OKAY   Non-Hodgkin lymphoma ([place])    DX ABOUT 2007-TX'D WITH CHEMO-IN REMISSION   Nonischemic cardiomyopathy ([place]) 11/25/2017   Obesity 05/14/2020   Obstructive sleep apnea 12/08/2012   Pure hypercholesterolemia 05/14/2020   Sciatica, right side 05/14/2020   Shortness of breath    WALKING UPSTAIRS   Skin sensation disturbance 05/14/2020   Subclinical hypothyroidism 05/14/2020   Testicular hypofunction 05/14/2020   Thyroid nodule 05/14/2020    Past Surgical History:  Procedure Laterality Date   BIOPSY  09/25/2019   Procedure: BIOPSY;  Surgeon: Otis Brace, MD;  Location: [facility];  Service: Gastroenterology;;   COLONOSCOPY WITH PROPOFOL N/A 01/09/2013   Procedure: COLONOSCOPY WITH PROPOFOL;  Surgeon: Garlan Fair, MD;  Location: [facility];  Service: Endoscopy;  Laterality: N/A;   COLONOSCOPY WITH PROPOFOL N/A 09/25/2019   Procedure: COLONOSCOPY WITH PROPOFOL;  Surgeon: Otis Brace, MD;  Location: [facility];  Service: Gastroenterology;  Laterality: N/A;   GASTRIC ROUX-EN-Y N/A 03/05/2013   Procedure: LAPAROSCOPIC ROUX-EN-Y GASTRIC BYPASS WITH UPPER ENDOSCOPY;  Surgeon: Gayland Curry, MD;  Location: [facility];  Service: General;  Laterality: N/A;   HYDROCELE EXCISION  02/09/2011   Procedure: HYDROCELECTOMY ADULT;  Surgeon: Fredricka Bonine, MD;  Location: [facility];  Service: Urology;  Laterality: Right;   POLYPECTOMY  09/25/2019   Procedure: POLYPECTOMY;  Surgeon: Otis Brace, MD;  Location: [facility];  Service: Gastroenterology;;    Current Medications: No outpatient medications have been marked as taking for the 04/02/21 encounter (Office Visit) with Javious Hallisey, Reita Cliche, MD.     Allergies:   Aciphex [rabeprazole sodium], Acyclovir and related, and Nsaids   Social History   Socioeconomic History   Marital status:  Single    Spouse name: Not on file   Number of children: Not on file   Years of education: Not on file   Highest education level: Not on file  Occupational History   Not on file  Tobacco Use   Smoking status: Never   Smokeless tobacco: Never  Substance and Sexual Activity   Alcohol use: Yes    Comment: RARELY   Drug use: No   Sexual activity: Not on file  Other Topics Concern   Not on file  Social History Narrative   Not on file   Social Determinants of Health   Financial Resource Strain: Not on file  Food Insecurity: Not on file  Transportation Needs: Not on file  Physical Activity: Not on file  Stress: Not on file  Social Connections: Not on file     Family History: The patient's He was adopted. Family history is unknown by patient.  ROS:   Please see the history of present illness.    All other systems reviewed and are negative.  EKGs/Labs/Other Studies Reviewed:  The following studies were reviewed today: I discussed my findings with the patient at length   Recent Labs: 10/07/2020: BUN 26; Creatinine, Ser 1.91; Potassium 4.6; Sodium 137  Recent Lipid Panel    Component Value Date/Time   CHOL 165 02/21/2019 0913   TRIG 116 02/21/2019 0913   HDL 57 02/21/2019 0913   CHOLHDL 2.9 02/21/2019 0913   CHOLHDL 3.2 08/07/2013 0815   VLDL 19 08/07/2013 0815   LDLCALC 87 02/21/2019 0913    Physical Exam:    VS:  BP 120/76    Pulse 74    Ht 6' (1.829 m)    Wt (!) 349 lb 1.3 oz (158.3 kg)    SpO2 96%    BMI 47.34 kg/m     Wt Readings from Last 3 Encounters:  04/02/21 (!) 349 lb 1.3 oz (158.3 kg)  10/07/20 (!) 371 lb 0.6 oz (168.3 kg)  05/14/20 (!) 380 lb 0.6 oz (172.4 kg)     GEN: Patient is in no acute distress HEENT: Normal NECK: No JVD; No carotid bruits LYMPHATICS: No lymphadenopathy CARDIAC: Hear sounds regular, 2/6 systolic murmur at the apex. RESPIRATORY:  Clear to auscultation without rales, wheezing or rhonchi  ABDOMEN: Soft, non-tender,  non-distended MUSCULOSKELETAL:  No edema; No deformity  SKIN: Warm and dry NEUROLOGIC:  Alert and oriented x 3 PSYCHIATRIC:  Normal affect   Signed, Jenean Lindau, MD  04/02/2021 4:46 PM    [facility]

## 2021-04-02 NOTE — Patient Instructions (Signed)

## 2021-04-04 ENCOUNTER — Other Ambulatory Visit: Payer: Self-pay | Admitting: Cardiology

## 2021-04-05 DIAGNOSIS — G4733 Obstructive sleep apnea (adult) (pediatric): Secondary | ICD-10-CM | POA: Diagnosis not present

## 2021-04-06 ENCOUNTER — Other Ambulatory Visit: Payer: Self-pay | Admitting: Cardiology

## 2021-04-06 DIAGNOSIS — I5022 Chronic systolic (congestive) heart failure: Secondary | ICD-10-CM

## 2021-04-06 DIAGNOSIS — N183 Chronic kidney disease, stage 3 unspecified: Secondary | ICD-10-CM

## 2021-04-06 DIAGNOSIS — G4733 Obstructive sleep apnea (adult) (pediatric): Secondary | ICD-10-CM | POA: Diagnosis not present

## 2021-05-04 DIAGNOSIS — N1832 Chronic kidney disease, stage 3b: Secondary | ICD-10-CM | POA: Diagnosis not present

## 2021-05-04 DIAGNOSIS — E1122 Type 2 diabetes mellitus with diabetic chronic kidney disease: Secondary | ICD-10-CM | POA: Diagnosis not present

## 2021-05-04 DIAGNOSIS — M1612 Unilateral primary osteoarthritis, left hip: Secondary | ICD-10-CM | POA: Diagnosis not present

## 2021-05-04 DIAGNOSIS — Z794 Long term (current) use of insulin: Secondary | ICD-10-CM | POA: Diagnosis not present

## 2021-05-06 DIAGNOSIS — G4733 Obstructive sleep apnea (adult) (pediatric): Secondary | ICD-10-CM | POA: Diagnosis not present

## 2021-06-03 DIAGNOSIS — G4733 Obstructive sleep apnea (adult) (pediatric): Secondary | ICD-10-CM | POA: Diagnosis not present

## 2021-06-11 DIAGNOSIS — E7800 Pure hypercholesterolemia, unspecified: Secondary | ICD-10-CM | POA: Diagnosis not present

## 2021-06-11 DIAGNOSIS — E1122 Type 2 diabetes mellitus with diabetic chronic kidney disease: Secondary | ICD-10-CM | POA: Diagnosis not present

## 2021-06-11 DIAGNOSIS — Z9884 Bariatric surgery status: Secondary | ICD-10-CM | POA: Diagnosis not present

## 2021-06-11 DIAGNOSIS — N1832 Chronic kidney disease, stage 3b: Secondary | ICD-10-CM | POA: Diagnosis not present

## 2021-06-11 DIAGNOSIS — E0590 Thyrotoxicosis, unspecified without thyrotoxic crisis or storm: Secondary | ICD-10-CM | POA: Diagnosis not present

## 2021-06-11 DIAGNOSIS — Z0000 Encounter for general adult medical examination without abnormal findings: Secondary | ICD-10-CM | POA: Diagnosis not present

## 2021-06-11 DIAGNOSIS — E538 Deficiency of other specified B group vitamins: Secondary | ICD-10-CM | POA: Diagnosis not present

## 2021-06-12 ENCOUNTER — Telehealth: Payer: Self-pay | Admitting: Cardiology

## 2021-06-12 NOTE — Telephone Encounter (Signed)
New message: ? ? ? ? ?Dr Lavone Orn would like to talk to you, concerning your patient, [name] Sosa ?

## 2021-06-16 ENCOUNTER — Telehealth: Payer: Self-pay | Admitting: Cardiology

## 2021-06-16 NOTE — Telephone Encounter (Signed)
Calling to speak with Dr. Geraldo Pitter bout the patient. Please advise  ?

## 2021-06-17 DIAGNOSIS — G4733 Obstructive sleep apnea (adult) (pediatric): Secondary | ICD-10-CM | POA: Diagnosis not present

## 2021-06-24 DIAGNOSIS — M1611 Unilateral primary osteoarthritis, right hip: Secondary | ICD-10-CM | POA: Diagnosis not present

## 2021-06-24 DIAGNOSIS — Z6841 Body Mass Index (BMI) 40.0 and over, adult: Secondary | ICD-10-CM | POA: Diagnosis not present

## 2021-07-04 DIAGNOSIS — G4733 Obstructive sleep apnea (adult) (pediatric): Secondary | ICD-10-CM | POA: Diagnosis not present

## 2021-07-17 ENCOUNTER — Other Ambulatory Visit: Payer: Self-pay | Admitting: Orthopaedic Surgery

## 2021-07-17 DIAGNOSIS — G4733 Obstructive sleep apnea (adult) (pediatric): Secondary | ICD-10-CM | POA: Diagnosis not present

## 2021-07-20 ENCOUNTER — Telehealth: Payer: Self-pay

## 2021-07-20 DIAGNOSIS — I5020 Unspecified systolic (congestive) heart failure: Secondary | ICD-10-CM

## 2021-07-20 DIAGNOSIS — N183 Chronic kidney disease, stage 3 unspecified: Secondary | ICD-10-CM

## 2021-07-20 DIAGNOSIS — Z01810 Encounter for preprocedural cardiovascular examination: Secondary | ICD-10-CM

## 2021-07-20 DIAGNOSIS — I428 Other cardiomyopathies: Secondary | ICD-10-CM

## 2021-07-20 DIAGNOSIS — Z794 Long term (current) use of insulin: Secondary | ICD-10-CM

## 2021-07-20 NOTE — Telephone Encounter (Signed)
? ?  Patient Name: [name] Sosa  ?DOB: Apr 26, 1960 ?MRN: [id number] ? ?Primary Cardiologist: Jenean Lindau, MD ? ?Chart reviewed as part of pre-operative protocol coverage. Patient was seen by Dr. Geraldo Pitter 03/2021 for evaluation at which time he stated, "Patient will be seen in follow-up appointment in 6 months or earlier if the patient has any concerns.  I told him that if he needs hip surgery I would consider getting a stress test done for restratification in view of multiple risk factors and is agreeable.  At the appropriate time he will contact me for this." ? ?Dr. Geraldo Pitter, patient has hip surgery preliminarily scheduled 08/11/21. Please advise on whether you want to see patient back in follow-up or recommend stress test/what kind. Please include your nursing staff on the message if the latter so they can help arrange. Preop team can follow for your recommendations and arrange a virtual visit after the test so we can review. Thanks. ? ? ?Charlie Pitter, PA-C ?07/20/2021, 3:55 PM ? ? ?

## 2021-07-20 NOTE — Telephone Encounter (Signed)
Pt aware of stress test required. Instructions reviewed and sent via MyChart. Pt verbalized understanding and had no additional questions. ?

## 2021-07-20 NOTE — Telephone Encounter (Signed)
? ?  Pre-operative Risk Assessment  ?  ?Patient Name: [name] Sosa  ?DOB: Jul 30, 1960 ?MRN: [id number]  ? ?  ? ?Request for Surgical Clearance   ? ?Procedure:   right anterior hip arthroplasty  ? ?Date of Surgery:  Clearance 08/11/21                              ?   ?Surgeon:  Dr. Melrose Nakayama ?Surgeon's Group or Practice Name:  [facility] ?Phone number:  [phone] ?Fax number:  [phone] ?  ?Type of Clearance Requested:   ?- Medical  ?  ?Type of Anesthesia:  Spinal ?  ?Additional requests/questions:   ? ?Signed, ?Mersadie Kavanaugh Tressa Busman   ?07/20/2021, 1:28 PM  ? ?

## 2021-07-22 NOTE — Telephone Encounter (Signed)
? ? ?  Name: [name] Sosa  ?DOB: 10/13/1960  ?MRN: [id number] ? ?Primary Cardiologist: Jenean Lindau, MD ? ?Patient will need tele visit arranged after stress test but it looks like we need to sign his consent for stress test so I LMTCB. Once we complete the [id number] order, we can request a tele visit to occur after 5/18. ? ?Charlie Pitter, PA-C ?07/22/2021, 8:22 AM ?[place] ?[address] ?[place], [place] [address] ? ? ?

## 2021-07-23 ENCOUNTER — Telehealth: Payer: Self-pay | Admitting: Cardiology

## 2021-07-23 NOTE — Telephone Encounter (Signed)
?  Patient Consent for Virtual Visit  ? ? ?    ? ?[name] has provided verbal consent on 07/23/2021 for a virtual visit (video or telephone). ? ? ?CONSENT FOR VIRTUAL VISIT FOR:  [name] Sosa  ?By participating in this virtual visit I agree to the following: ? ?I hereby voluntarily request, consent and authorize [facility] and its employed or contracted physicians, physician assistants, nurse practitioners or other licensed health care professionals (the Practitioner), to provide me with telemedicine health care services (the ?Services") as deemed necessary by the treating Practitioner. I acknowledge and consent to receive the Services by the Practitioner via telemedicine. I understand that the telemedicine visit will involve communicating with the Practitioner through live audiovisual communication technology and the disclosure of certain medical information by electronic transmission. I acknowledge that I have been given the opportunity to request an in-person assessment or other available alternative prior to the telemedicine visit and am voluntarily participating in the telemedicine visit. ? ?I understand that I have the right to withhold or withdraw my consent to the use of telemedicine in the course of my care at any time, without affecting my right to future care or treatment, and that the Practitioner or I may terminate the telemedicine visit at any time. I understand that I have the right to inspect all information obtained and/or recorded in the course of the telemedicine visit and may receive copies of available information for a reasonable fee.  I understand that some of the potential risks of receiving the Services via telemedicine include:  ?Delay or interruption in medical evaluation due to technological equipment failure or disruption; ?Information transmitted may not be sufficient (e.g. poor resolution of images) to allow for appropriate medical decision making by the Practitioner;  and/or  ?In rare instances, security protocols could fail, causing a breach of personal health information. ? ?Furthermore, I acknowledge that it is my responsibility to provide information about my medical history, conditions and care that is complete and accurate to the best of my ability. I acknowledge that Practitioner's advice, recommendations, and/or decision may be based on factors not within their control, such as incomplete or inaccurate data provided by me or distortions of diagnostic images or specimens that may result from electronic transmissions. I understand that the practice of medicine is not an exact science and that Practitioner makes no warranties or guarantees regarding treatment outcomes. I acknowledge that a copy of this consent can be made available to me via my patient portal ([facility]), or I can request a printed copy by calling the office of [facility].   ? ?I understand that my insurance will be billed for this visit.  ? ?I have read or had this consent read to me. ?I understand the contents of this consent, which adequately explains the benefits and risks of the Services being provided via telemedicine.  ?I have been provided ample opportunity to ask questions regarding this consent and the Services and have had my questions answered to my satisfaction. ?I give my informed consent for the services to be provided through the use of telemedicine in my medical care ? ? ?

## 2021-07-23 NOTE — Telephone Encounter (Signed)
?  Patient states he is returning a call regarding his pre op clearance ?

## 2021-07-29 ENCOUNTER — Telehealth (HOSPITAL_COMMUNITY): Payer: Self-pay | Admitting: *Deleted

## 2021-07-29 NOTE — Telephone Encounter (Signed)
Left message on voicemail per DPR in reference to upcoming appointment scheduled on 08/05/2021 at 1:15 with detailed instructions given per Myocardial Perfusion Study Information Sheet for the test. LM to arrive 15 minutes early, and that it is imperative to arrive on time for appointment to keep from having the test rescheduled. If you need to cancel or reschedule your appointment, please call the office within 24 hours of your appointment. Failure to do so may result in a cancellation of your appointment, and a $50 no show fee. Phone number given for call back for any questions.  ? ?

## 2021-07-30 NOTE — Telephone Encounter (Signed)
Open encounter to sign attestation. ?

## 2021-08-03 DIAGNOSIS — M1611 Unilateral primary osteoarthritis, right hip: Secondary | ICD-10-CM | POA: Diagnosis not present

## 2021-08-04 NOTE — Progress Notes (Addendum)
COVID Vaccine Completed: yes x2  Date of COVID positive in last 90 days: no  PCP - Lavone Orn, MD Cardiologist - Jyl Heinz, MD Electrophysiologist- Allegra Lai, MD  Clearance by Jobe Gibbon 07/21/21 on chart Needs cardiac clearance   Chest x-ray - 08/05/21 Epic EKG - 08/05/21 Epic/chart Stress Test - 08/05/21  ECHO - 09/19/20 Epic Cardiac Cath - 2003 per pt Pacemaker/ICD device last checked: n/a Spinal Cord Stimulator: n/a  Bowel Prep - no  Sleep Study - yes CPAP - yes every night  Fasting Blood Sugar - 75-113 Checks Blood Sugar 2 times a day  Blood Thinner Instructions: Aspirin Instructions: ASA 81 hold 7 days Last Dose: 08/03/21  Activity level: Can perform activities of daily living without stopping and without symptoms of chest pain or shortness of breath. Difficulty with stairs due to hip       Anesthesia review: cardiomyopathy, CHF, HTN, DM2, OSA, CKD, non-hodgkin lymphoma, creatinine 2.11, mediastinum mass on CXR  Patient denies shortness of breath, fever, cough and chest pain at PAT appointment   Patient verbalized understanding of instructions that were given to them at the PAT appointment. Patient was also instructed that they will need to review over the PAT instructions again at home before surgery.

## 2021-08-04 NOTE — Patient Instructions (Addendum)
DUE TO COVID-19 ONLY TWO VISITORS  (aged 63 and older)  ARE ALLOWED TO COME WITH YOU AND STAY IN THE WAITING ROOM ONLY DURING PRE OP AND PROCEDURE.   ?**NO VISITORS ARE ALLOWED IN THE SHORT STAY AREA OR RECOVERY ROOM!!** ? ?IF YOU WILL BE ADMITTED INTO THE HOSPITAL YOU ARE ALLOWED ONLY FOUR SUPPORT PEOPLE DURING VISITATION HOURS ONLY (7 AM -8PM)   ?The support person(s) must pass our screening, gel in and out, and wear a mask at all times, including in the patient?s room. ?Patients must also wear a mask when staff or their support person are in the room. ?Visitors GUEST BADGE MUST BE WORN VISIBLY  ?One adult visitor may remain with you overnight and MUST be in the room by 8 P.M. ?  ? ? Your procedure is scheduled on: 08/11/21 ? ? Report to [facility] Main Entrance ? ?  Report to admitting at 9:40 AM ? ? Call this number if you have problems the morning of surgery [phone] ? ? Do not eat food :After Midnight. ? ? After Midnight you may have the following liquids until 9:25 AM DAY OF SURGERY ? ?Water ?Black Coffee (sugar ok, NO MILK/CREAM OR CREAMERS)  ?Tea (sugar ok, NO MILK/CREAM OR CREAMERS) regular and decaf                             ?Plain Jell-O (NO RED)                                           ?Fruit ices (not with fruit pulp, NO RED)                                     ?Popsicles (NO RED)                                                                  ?Juice: apple, WHITE grape, WHITE cranberry ?Sports drinks like Gatorade (NO RED) ?Clear broth(vegetable,chicken,beef) ?  ?  ?The day of surgery:  ?Drink ONE (1) Pre-Surgery G2 at 9:25 AM the morning of surgery. Drink in one sitting. Do not sip.  ?This drink was given to you during your hospital  ?pre-op appointment visit. ?Nothing else to drink after completing the  ?Pre-Surgery G2. ?  ?       If you have questions, please contact your surgeon?s office. ? ? ?FOLLOW BOWEL PREP AND ANY ADDITIONAL PRE OP INSTRUCTIONS YOU RECEIVED FROM YOUR  SURGEON'S OFFICE!!! ?  ?  ?Oral Hygiene is also important to reduce your risk of infection.                                    ?Remember - BRUSH YOUR TEETH THE MORNING OF SURGERY WITH YOUR REGULAR TOOTHPASTE ? ? Take these medicines the morning of surgery with A SIP OF WATER: Tylenol, Xanax, Carvedilol, Tramadol. ? ?DO NOT TAKE ANY ORAL DIABETIC MEDICATIONS DAY OF YOUR  SURGERY ? ?How to Manage Your Diabetes ?Before and After Surgery ? ?Why is it important to control my blood sugar before and after surgery? ?Improving blood sugar levels before and after surgery helps healing and can limit problems. ?A way of improving blood sugar control is eating a healthy diet by: ? Eating less sugar and carbohydrates ? Increasing activity/exercise ? Talking with your doctor about reaching your blood sugar goals ?High blood sugars (greater than 180 mg/dL) can raise your risk of infections and slow your recovery, so you will need to focus on controlling your diabetes during the weeks before surgery. ?Make sure that the doctor who takes care of your diabetes knows about your planned surgery including the date and location. ? ?How do I manage my blood sugar before surgery? ?Check your blood sugar at least 4 times a day, starting 2 days before surgery, to make sure that the level is not too high or low. ?Check your blood sugar the morning of your surgery when you wake up and every 2 hours until you get to the Short Stay unit. ?If your blood sugar is less than 70 mg/dL, you will need to treat for low blood sugar: ?Do not take insulin. ?Treat a low blood sugar (less than 70 mg/dL) with ? cup of clear juice (cranberry or apple), 4 glucose tablets, OR glucose gel. ?Recheck blood sugar in 15 minutes after treatment (to make sure it is greater than 70 mg/dL). If your blood sugar is not greater than 70 mg/dL on recheck, call [phone] for further instructions. ?Report your blood sugar to the short stay nurse when you get to Short  Stay. ? ?If you are admitted to the hospital after surgery: ?Your blood sugar will be checked by the staff and you will probably be given insulin after surgery (instead of oral diabetes medicines) to make sure you have good blood sugar levels. ?The goal for blood sugar control after surgery is 80-180 mg/dL. ? ? ?WHAT DO I DO ABOUT MY DIABETES MEDICATION? ? ?Do not take oral diabetes medicines (pills) the morning of surgery. ? ?THE DAY BEFORE SURGERY, take Humalog before meals as prescribed. Do not take [place]. Do not take [place]    ? ? ?THE MORNING OF SURGERY, do not take [place]. Do not take Humalog unless blood sugar is greater than 220, then take 50% of dose. ? ?The day of surgery, do not take other diabetes injectables, including Byetta (exenatide), Bydureon (exenatide ER), Victoza (liraglutide), or Trulicity (dulaglutide). ? ?If your CBG is greater than 220 mg/dL, you may take ? of your sliding scale  ?(correction) dose of insulin. ? ?Reviewed and Endorsed by [facility] Patient Education Committee, August 2015  ? ?Bring CPAP mask and tubing day of surgery. ?                  ?           You may not have any metal on your body including jewelry, and body piercing ? ?           Do not wear lotions, powders, cologne, or deodorant ? ?            Men may shave face and neck. ? ? Do not bring valuables to the hospital. [facility] NOT ?            RESPONSIBLE   FOR VALUABLES. ? ? Bring small overnight bag day of surgery. ?  ? Special Instructions: Bring a  copy of your healthcare power of attorney and living will documents         the day of surgery if you haven't scanned them before. ? ?            Please read over the following fact sheets you were given: IF [place] [phone]- Apolonio Schneiders ? ?   [facility] - Preparing for Surgery ?Before surgery, you can play an important role.  Because skin is not sterile, your skin needs to be as free of germs as possible.   You can reduce the number of germs on your skin by washing with CHG (chlorahexidine gluconate) soap before surgery.  CHG is an antiseptic cleaner which kills germs and bonds with the skin to continue killing germs even after washing. ?Please DO NOT use if you have an allergy to CHG or antibacterial soaps.  If your skin becomes reddened/irritated stop using the CHG and inform your nurse when you arrive at Short Stay. ?Do not shave (including legs and underarms) for at least 48 hours prior to the first CHG shower.  You may shave your face/neck. ? ?Please follow these instructions carefully: ? 1.  Shower with CHG Soap the night before surgery and the  morning of surgery. ? 2.  If you choose to wash your hair, wash your hair first as usual with your normal  shampoo. ? 3.  After you shampoo, rinse your hair and body thoroughly to remove the shampoo.                            ? 4.  Use CHG as you would any other liquid soap.  You can apply chg directly to the skin and wash.  Gently with a scrungie or clean washcloth. ? 5.  Apply the CHG Soap to your body ONLY FROM THE NECK DOWN.   Do   not use on face/ open      ?                     Wound or open sores. Avoid contact with eyes, ears mouth and   genitals (private parts).  ?                     Production manager,  Genitals (private parts) with your normal soap. ?            6.  Wash thoroughly, paying special attention to the area where your    surgery  will be performed. ? 7.  Thoroughly rinse your body with warm water from the neck down. ? 8.  DO NOT shower/wash with your normal soap after using and rinsing off the CHG Soap. ?               9.  Pat yourself dry with a clean towel. ?           10.  Wear clean pajamas. ?           11.  Place clean sheets on your bed the night of your first shower and do not  sleep with pets. ?Day of Surgery : ?Do not apply any lotions/deodorants the morning of surgery.  Please wear clean clothes to the hospital/surgery center. ? ?FAILURE TO  FOLLOW THESE INSTRUCTIONS MAY RESULT IN THE CANCELLATION OF YOUR SURGERY ? ?PATIENT SIGNATURE_________________________________ ? ?NURSE SIGNATURE__________________________________ ? ?______________________________________

## 2021-08-05 ENCOUNTER — Encounter (HOSPITAL_COMMUNITY)
Admission: RE | Admit: 2021-08-05 | Discharge: 2021-08-05 | Disposition: A | Discharge status: Home / Self Care | Payer: BC Managed Care – PPO | Source: Ambulatory Visit | Attending: Orthopaedic Surgery | Admitting: Orthopaedic Surgery

## 2021-08-05 ENCOUNTER — Encounter (HOSPITAL_COMMUNITY): Payer: Self-pay

## 2021-08-05 ENCOUNTER — Ambulatory Visit (HOSPITAL_BASED_OUTPATIENT_CLINIC_OR_DEPARTMENT_OTHER): Payer: BC Managed Care – PPO

## 2021-08-05 ENCOUNTER — Ambulatory Visit (HOSPITAL_COMMUNITY)
Admission: RE | Admit: 2021-08-05 | Discharge: 2021-08-05 | Disposition: A | Discharge status: Home / Self Care | Payer: BC Managed Care – PPO | Source: Ambulatory Visit | Attending: Orthopaedic Surgery | Admitting: Orthopaedic Surgery

## 2021-08-05 VITALS — BP 132/81 | HR 78 | Temp 98.1°F | Resp 14 | Ht 73.0 in | Wt 315.0 lb

## 2021-08-05 DIAGNOSIS — Z794 Long term (current) use of insulin: Secondary | ICD-10-CM | POA: Insufficient documentation

## 2021-08-05 DIAGNOSIS — E119 Type 2 diabetes mellitus without complications: Secondary | ICD-10-CM

## 2021-08-05 DIAGNOSIS — I10 Essential (primary) hypertension: Secondary | ICD-10-CM

## 2021-08-05 DIAGNOSIS — E1169 Type 2 diabetes mellitus with other specified complication: Secondary | ICD-10-CM | POA: Insufficient documentation

## 2021-08-05 DIAGNOSIS — Z01818 Encounter for other preprocedural examination: Secondary | ICD-10-CM

## 2021-08-05 DIAGNOSIS — N183 Chronic kidney disease, stage 3 unspecified: Secondary | ICD-10-CM | POA: Insufficient documentation

## 2021-08-05 DIAGNOSIS — I428 Other cardiomyopathies: Secondary | ICD-10-CM

## 2021-08-05 DIAGNOSIS — Z01810 Encounter for preprocedural cardiovascular examination: Secondary | ICD-10-CM | POA: Insufficient documentation

## 2021-08-05 DIAGNOSIS — I5020 Unspecified systolic (congestive) heart failure: Secondary | ICD-10-CM | POA: Diagnosis not present

## 2021-08-05 LAB — HEMOGLOBIN A1C
Hgb A1c MFr Bld: 6.3 % — ABNORMAL HIGH (ref 4.8–5.6)
Mean Plasma Glucose: 134.11 mg/dL

## 2021-08-05 LAB — BASIC METABOLIC PANEL
Anion gap: 6 (ref 5–15)
BUN: 28 mg/dL — ABNORMAL HIGH (ref 8–23)
CO2: 26 mmol/L (ref 22–32)
Calcium: 9.2 mg/dL (ref 8.9–10.3)
Chloride: 108 mmol/L (ref 98–111)
Creatinine, Ser: 2.11 mg/dL — ABNORMAL HIGH (ref 0.61–1.24)
GFR, Estimated: 35 mL/min — ABNORMAL LOW (ref >60)
Glucose, Bld: 112 mg/dL — ABNORMAL HIGH (ref 70–99)
Potassium: 4.6 mmol/L (ref 3.5–5.1)
Sodium: 140 mmol/L (ref 135–145)

## 2021-08-05 LAB — SURGICAL PCR SCREEN
MRSA, PCR: NEGATIVE
Staphylococcus aureus: NEGATIVE

## 2021-08-05 LAB — CBC
HCT: 43.5 % (ref 39.0–52.0)
Hemoglobin: 13.6 g/dL (ref 13.0–17.0)
MCH: 27.5 pg (ref 26.0–34.0)
MCHC: 31.3 g/dL (ref 30.0–36.0)
MCV: 88.1 fL (ref 80.0–100.0)
Platelets: 246 10*3/uL (ref 150–400)
RBC: 4.94 MIL/uL (ref 4.22–5.81)
RDW: 14.2 % (ref 11.5–15.5)
WBC: 4.5 10*3/uL (ref 4.0–10.5)
nRBC: 0 % (ref 0.0–0.2)

## 2021-08-05 LAB — GLUCOSE, CAPILLARY: Glucose-Capillary: 101 mg/dL — ABNORMAL HIGH (ref 70–99)

## 2021-08-05 MED ORDER — REGADENOSON 0.4 MG/5ML IV SOLN
0.4000 mg | Freq: Once | INTRAVENOUS | Status: AC
  Administered 2021-08-05: 0.4 mg via INTRAVENOUS

## 2021-08-05 MED ORDER — TECHNETIUM TC 99M TETROFOSMIN IV KIT
32.3000 | PACK | Freq: Once | INTRAVENOUS | Status: AC | PRN
  Administered 2021-08-05: 32.3 via INTRAVENOUS

## 2021-08-05 NOTE — Progress Notes (Signed)
Creatinine 2.11, results routed to Dr. Rhona Raider ?

## 2021-08-06 ENCOUNTER — Ambulatory Visit (HOSPITAL_COMMUNITY): Payer: BC Managed Care – PPO | Attending: Cardiology

## 2021-08-06 ENCOUNTER — Encounter (HOSPITAL_COMMUNITY): Payer: Self-pay | Admitting: Physician Assistant

## 2021-08-06 ENCOUNTER — Other Ambulatory Visit: Payer: Self-pay | Admitting: Orthopaedic Surgery

## 2021-08-06 ENCOUNTER — Ambulatory Visit
Admission: RE | Admit: 2021-08-06 | Discharge: 2021-08-06 | Disposition: A | Discharge status: Home / Self Care | Payer: BC Managed Care – PPO | Source: Ambulatory Visit | Attending: Orthopaedic Surgery | Admitting: Orthopaedic Surgery

## 2021-08-06 DIAGNOSIS — Z8572 Personal history of non-Hodgkin lymphomas: Secondary | ICD-10-CM | POA: Diagnosis not present

## 2021-08-06 DIAGNOSIS — R222 Localized swelling, mass and lump, trunk: Secondary | ICD-10-CM

## 2021-08-06 DIAGNOSIS — E041 Nontoxic single thyroid nodule: Secondary | ICD-10-CM | POA: Diagnosis not present

## 2021-08-06 DIAGNOSIS — Z01818 Encounter for other preprocedural examination: Secondary | ICD-10-CM | POA: Diagnosis not present

## 2021-08-06 DIAGNOSIS — J398 Other specified diseases of upper respiratory tract: Secondary | ICD-10-CM | POA: Diagnosis not present

## 2021-08-06 LAB — MYOCARDIAL PERFUSION IMAGING
LV dias vol: 228 mL (ref 62–150)
LV sys vol: 164 mL
Nuc Stress EF: 28 %
Peak HR: 97 {beats}/min
Rest HR: 75 {beats}/min
Rest Nuclear Isotope Dose: 31.5 mCi
SDS: 6
SRS: 0
SSS: 6
ST Depression (mm): 0 mm
Stress Nuclear Isotope Dose: 32.3 mCi
TID: 1.06

## 2021-08-06 MED ORDER — IOPAMIDOL (ISOVUE-300) INJECTION 61%
65.0000 mL | Freq: Once | INTRAVENOUS | Status: AC | PRN
  Administered 2021-08-06: 65 mL via INTRAVENOUS

## 2021-08-06 MED ORDER — TECHNETIUM TC 99M TETROFOSMIN IV KIT
31.5000 | PACK | Freq: Once | INTRAVENOUS | Status: AC | PRN
  Administered 2021-08-06: 31.5 via INTRAVENOUS

## 2021-08-06 NOTE — Progress Notes (Addendum)
Anesthesia Chart Review   Case: [id number] Date/Time: 08/11/21 1211   Procedure: RIGHT TOTAL HIP ARTHROPLASTY ANTERIOR APPROACH (Right: Hip)   Anesthesia type: Spinal   Pre-op diagnosis: RIGHT HIP DEGENERATIVE JOINT DISEASE   Location[facility] ROOM 06 / [facility]   Surgeons: Melrose Nakayama, MD       DISCUSSION:61 y.o. never smoker with h/o CHF, sleep apnea, Non-Hodgkin Lymphoma, DM II, CKD Stage III, right hip djd scheduled for above procedure 08/11/2021 with Dr. Melrose Nakayama.   Pt last seen by cardiology 04/02/2021. Per OV note, "I told him that if he needs hip surgery I would consider getting a stress test done for restratification in view of multiple risk factors and is agreeable.  At the appropriate time he will contact me for this."  Low risk stress test 08/05/2021.   Seen by cardiology 08/07/2021. Per OV note, "Preoperative Cardiovascular Risk Assessment: Revised cardiac risk index is >11% indicating high CV risk at baseline; reviewed nuclear stress test with Dr. Geraldo Pitter who feels given the absence of ischemia and no recent cardiac symptoms, OK to proceed at moderate CV risk. Relayed the stress test result and recommendation to the patient. Given his history of heart failure, recommend monitoring rhythm on telemetry and volume status closely post-operatively. The original clearance request does not include any request to hold aspirin. The patient states he was already told to hold for 7 days. No specific contraindications exist from cardiac standpoint if needing to interrupt therapy. We typically advise that blood thinners be resumed when felt safe by performing physician."  Chest xray done at PAT visit with potential mediastinal mass.  CT for further evaluation done 08/06/2021, results pending.  Addendum 08/07/2021:  CT shows enlarged left thyroid with tracheal deviation 3cm to the right.  Per Dr. Laurann Montana" This is a very old goiter, had been biopsied in the past and benign. It has grown modestly  over time. It is displacing the trachea to the left about 3 cm but no compression.  As long as anesthesia feels like they can still intubate him, then I think he can have surgery.  Mr. Gonnella also has subclinical, but not overt hyperthyroidism, as long as he stays on beta blocker carvedilol he should be ok."  Discussed with Dr. Sabra Heck.  Anticipate pt can proceed with planned procedure barring acute status change and after evaluation by anesthesia DOS>  VS: There were no vitals taken for this visit.  PROVIDERS: Lavone Orn, MD is PCP    LABS: Labs reviewed: Acceptable for surgery. (all labs ordered are listed, but only abnormal results are displayed)  Labs Reviewed - No data to display   IMAGES: Chest Xray 08/05/2021 IMPRESSION: 1. Potential superior mediastinal mass. Further evaluation with contrast enhanced chest CT is strongly recommended at this time to exclude neoplasm.  EKG: 08/05/2021 Rate 78 bpm  NSR LAD No significant change since last tracing  CV: Myocardial Perfusion 08/06/2021   Findings are consistent with no prior ischemia. The study is high risk.   No ST deviation was noted.   Left ventricular function is abnormal. Global function is severely reduced. Nuclear stress EF: 28 %. The left ventricular ejection fraction is severely decreased (<30%). End diastolic cavity size is severely enlarged. End systolic cavity size is severely enlarged.   Prior study not available for comparison.   Abnormal, high risk stress nuclear study with thinning of the inferolateral wall versus prior infarct; no ischemia; gated ejection fraction 28% with global hypokinesis.  Severe left ventricular enlargement  noted.  Study interpreted as high risk due to reduced LV function.  Echo 09/19/20 1. Left ventricular ejection fraction, by estimation, is 30 to 35%. The  left ventricle has moderately decreased function. The left ventricle has  no regional wall motion abnormalities. Left ventricular  diastolic  parameters are consistent with Grade II  diastolic dysfunction (pseudonormalization).   Past Medical History:  Diagnosis Date   Adenomatous polyp of colon 05/14/2020   Anemia    Anxiety    HX OF ANXIETY ATTACKS--CAN'T SLEEP ON BACK-FEELS LIKE HE CAN'T BREATHE   Apnea, sleep    SEVERE OSA PER STUDY 12/01/12   Bariatric surgery status 05/14/2020   Body mass index (BMI) 45.0-49.9, adult ([place]) 05/14/2020   Cancer ([place])    HX OF NON HODGKIN'S LYMPHOMA -IN REMISSION   Cardiomyopathy ([place]) 04/27/2018   Cardiomyopathy, nonischemic ([place])    DX 2003 CARDIAC CATH, ECHO 2006 SHOWED EF IMPROVED TO 45-50%--PER CARDIOLOGY OFFICE NOTES DR. Linard Millers FROM 01/20/10   CHF (congestive heart failure) ([place]) 2003   Chronic kidney disease    HX OF CHRONIC KIDNEY DISEASE STAGE III - EVALUATED AT [facility] IN THE PAST AND FELT TO BE RELATED TO NSAID'S AND CHEMO.  PT  STATES HE HAS NOT SEEN KIDNEY SPECIALIST AT [facility] IN OVER A YEAR.   Chronic rhinitis 27/25/3664   Chronic systolic heart failure ([place])    CARDIOLOGIST IS DR. Linard Millers   CKD (chronic kidney disease), stage III ([place]) 12/08/2012   Claustrophobia    Diabetes ([place]) 04/27/2012   Diabetic renal disease ([place]) 05/14/2020   Diffuse large cell lymphoma in remission ([place]) 04/27/2012   Gastro-esophageal reflux disease without esophagitis 05/14/2020   GERD (gastroesophageal reflux disease)    NOT TAKING ANY MEDS FOR REFLUX   History of adenomatous polyp of colon 05/14/2020   History of Lap Roux-en-Y gastric bypass 03/05/13 03/23/2013   Hydrocele 05/14/2020   Hyperlipidemia    Hypertension    IDDM (insulin dependent diabetes mellitus) 12/08/2012   Long term (current) use of insulin ([place]) 05/14/2020   Morbid obesity ([place])    Multiple thyroid nodules    HX OF NODULES=PT STATES BIOPSY-TOLD ALL OKAY   Non-Hodgkin lymphoma ([place])    DX ABOUT 2007-TX'D WITH CHEMO-IN REMISSION   Nonischemic cardiomyopathy ([place]) 11/25/2017   Obesity 05/14/2020    Obstructive sleep apnea 12/08/2012   Pure hypercholesterolemia 05/14/2020   Sciatica, right side 05/14/2020   Shortness of breath    WALKING UPSTAIRS   Skin sensation disturbance 05/14/2020   Subclinical hypothyroidism 05/14/2020   Testicular hypofunction 05/14/2020   Thyroid nodule 05/14/2020    Past Surgical History:  Procedure Laterality Date   BIOPSY  09/25/2019   Procedure: BIOPSY;  Surgeon: Otis Brace, MD;  Location: [facility];  Service: Gastroenterology;;   COLONOSCOPY WITH PROPOFOL N/A 01/09/2013   Procedure: COLONOSCOPY WITH PROPOFOL;  Surgeon: Garlan Fair, MD;  [facility];  Service: Endoscopy;  Laterality: N/A;   COLONOSCOPY WITH PROPOFOL N/A 09/25/2019   Procedure: COLONOSCOPY WITH PROPOFOL;  Surgeon: Otis Brace, MD;  [facility];  Service: Gastroenterology;  Laterality: N/A;   GASTRIC ROUX-EN-Y N/A 03/05/2013   Procedure: LAPAROSCOPIC ROUX-EN-Y GASTRIC BYPASS WITH UPPER ENDOSCOPY;  Surgeon: Gayland Curry, MD;  [facility];  Service: General;  Laterality: N/A;   HYDROCELE EXCISION  02/09/2011   Procedure: HYDROCELECTOMY ADULT;  Surgeon: Fredricka Bonine, MD;  [facility];  Service: Urology;  Laterality: Right;   POLYPECTOMY  09/25/2019  Procedure: POLYPECTOMY;  Surgeon: Otis Brace, MD;  [facility];  Service: Gastroenterology;;    MEDICATIONS: No current facility-administered medications for this encounter.    acetaminophen (TYLENOL) 650 MG CR tablet   ALPRAZolam (XANAX) 0.5 MG tablet   aspirin EC 81 MG tablet   calcium carbonate (OS-CAL - DOSED IN MG OF ELEMENTAL CALCIUM) 1250 (500 Ca) MG tablet   carvedilol (COREG) 25 MG tablet   FARXIGA 10 MG TABS tablet   fluticasone (FLONASE) 50 MCG/ACT nasal spray   HUMALOG KWIKPEN 200 UNIT/ML KwikPen   Menthol, Topical Analgesic, (BIOFREEZE) 4 % GEL   Multiple Vitamin (MULTIVITAMIN WITH MINERALS) TABS tablet   sacubitril-valsartan (ENTRESTO) 49-51 MG    SOLIQUA 100-33 UNT-MCG/ML SOPN   traMADol (ULTRAM) 50 MG tablet   zolpidem (AMBIEN) 10 MG tablet     [facility], PA-C [facility] Pre-Surgical Testing [phone]

## 2021-08-07 ENCOUNTER — Ambulatory Visit (INDEPENDENT_AMBULATORY_CARE_PROVIDER_SITE_OTHER): Payer: BC Managed Care – PPO | Admitting: Physician Assistant

## 2021-08-07 DIAGNOSIS — Z01810 Encounter for preprocedural cardiovascular examination: Secondary | ICD-10-CM

## 2021-08-07 NOTE — Telephone Encounter (Signed)
Pending VV 5/19, will remove from preop box

## 2021-08-07 NOTE — Progress Notes (Signed)
Virtual Visit via Telephone Note   Because of [name] Sosa co-morbid illnesses, he is at least at moderate risk for complications without adequate follow up.  This format is felt to be most appropriate for this patient at this time.  The patient did not have access to video technology/had technical difficulties with video requiring transitioning to audio format only (telephone).  All issues noted in this document were discussed and addressed.  No physical exam could be performed with this format.  Please refer to the patient's chart for his consent to telehealth for [name] Sosa.  Evaluation Performed:  Preoperative cardiovascular risk assessment _____________   Date:  08/07/2021   Patient ID:  [name] Sosa, DOB Mar 26, 1960, MRN [id number] Patient Location:  Home Provider location:   Office  Primary Care Provider:  Lavone Orn, MD Primary Cardiologist:  Jenean Lindau, MD  Chief Complaint    61 y.o. y/o male with a h/o longstanding NICM, normal coronaries by cath 2003, severe morbid obesity with severe OSA, anemia, anxiety, CKD stage 3, DM, lymphoma, HTN, HLD, thyroid disorder who is pending right anterior hip arthroplasty, and presents today for telephonic preoperative cardiovascular risk assessment.  Past Medical History    Past Medical History:  Diagnosis Date   Adenomatous polyp of colon 05/14/2020   Anemia    Anxiety    HX OF ANXIETY ATTACKS--CAN'T SLEEP ON BACK-FEELS LIKE HE CAN'T BREATHE   Apnea, sleep    SEVERE OSA PER STUDY 12/01/12   Bariatric surgery status 05/14/2020   Body mass index (BMI) 45.0-49.9, adult ([place]) 05/14/2020   Cancer ([place])    HX OF NON HODGKIN'S LYMPHOMA -IN REMISSION   Cardiomyopathy ([place]) 04/27/2018   Cardiomyopathy, nonischemic ([place])    DX 2003 CARDIAC CATH, ECHO 2006 SHOWED EF IMPROVED TO 45-50%--PER CARDIOLOGY OFFICE NOTES DR. Linard Millers FROM 01/20/10   CHF (congestive heart failure) ([place]) 2003   Chronic kidney disease    HX OF  CHRONIC KIDNEY DISEASE STAGE III - EVALUATED AT [facility] IN THE PAST AND FELT TO BE RELATED TO NSAID'S AND CHEMO.  PT  STATES HE HAS NOT SEEN KIDNEY SPECIALIST AT [facility] IN OVER A YEAR.   Chronic rhinitis 28/41/3244   Chronic systolic heart failure ([place])    CARDIOLOGIST IS DR. Linard Millers   CKD (chronic kidney disease), stage III ([place]) 12/08/2012   Claustrophobia    Diabetes ([place]) 04/27/2012   Diabetic renal disease ([place]) 05/14/2020   Diffuse large cell lymphoma in remission ([place]) 04/27/2012   Gastro-esophageal reflux disease without esophagitis 05/14/2020   GERD (gastroesophageal reflux disease)    NOT TAKING ANY MEDS FOR REFLUX   History of adenomatous polyp of colon 05/14/2020   History of Lap Roux-en-Y gastric bypass 03/05/13 03/23/2013   Hydrocele 05/14/2020   Hyperlipidemia    Hypertension    IDDM (insulin dependent diabetes mellitus) 12/08/2012   Long term (current) use of insulin ([place]) 05/14/2020   Morbid obesity ([place])    Multiple thyroid nodules    HX OF NODULES=PT STATES BIOPSY-TOLD ALL OKAY   Non-Hodgkin lymphoma ([place])    DX ABOUT 2007-TX'D WITH CHEMO-IN REMISSION   Nonischemic cardiomyopathy ([place]) 11/25/2017   Obesity 05/14/2020   Obstructive sleep apnea 12/08/2012   Pure hypercholesterolemia 05/14/2020   Sciatica, right side 05/14/2020   Shortness of breath    WALKING UPSTAIRS   Skin sensation disturbance 05/14/2020   Subclinical hypothyroidism 05/14/2020   Testicular hypofunction 05/14/2020   Thyroid nodule 05/14/2020   Past Surgical History:  Procedure Laterality Date   BIOPSY  09/25/2019   Procedure: BIOPSY;  Surgeon: Otis Brace, MD;  Location: [facility];  Service: Gastroenterology;;   COLONOSCOPY WITH PROPOFOL N/A 01/09/2013   Procedure: COLONOSCOPY WITH PROPOFOL;  Surgeon: Garlan Fair, MD;  Location: [facility];  Service: Endoscopy;  Laterality: N/A;   COLONOSCOPY WITH PROPOFOL N/A 09/25/2019   Procedure: COLONOSCOPY WITH PROPOFOL;  Surgeon:  Otis Brace, MD;  [facility];  Service: Gastroenterology;  Laterality: N/A;   GASTRIC ROUX-EN-Y N/A 03/05/2013   Procedure: LAPAROSCOPIC ROUX-EN-Y GASTRIC BYPASS WITH UPPER ENDOSCOPY;  Surgeon: Gayland Curry, MD;  Location: [facility];  Service: General;  Laterality: N/A;   HYDROCELE EXCISION  02/09/2011   Procedure: HYDROCELECTOMY ADULT;  Surgeon: Fredricka Bonine, MD;  [facility]: [facility];  Service: Urology;  Laterality: Right;   POLYPECTOMY  09/25/2019   Procedure: POLYPECTOMY;  Surgeon: Otis Brace, MD;  [facility];  Service: Gastroenterology;;    Allergies  Allergies  Allergen Reactions   Aciphex [Rabeprazole Sodium]     Doesn't work    Acyclovir And Related     unkn   Nsaids     AVOIDS NSAID'S DUE TO HX OF CHRONIC KIDNEY DISEASE    History of Present Illness    [name] Sosa is a 61 y.o. male who presents via audio/video conferencing for a telehealth visit today. Chart reviewed from Dr. Julien Nordmann notes. Has h/o NICM with last echo 09/2020 EF 30-35%, g2DD, felt stable compared to prior. Has previously declined ICD. Pt was last seen in cardiology clinic on 04/02/21 by Dr. Geraldo Pitter.  At that time [name] Sosa was overall all felt to be stable from cardiac standpoint with recommendation for weight loss and stress testing if the patient were to pursue hip surgery. A stress test was performed yesterday which was interpreted as high risk due to known LV dysfunction (EF 28%), with thinning of the inferolateral wall versus prior infarct, but no ischemia. The patient is now pending procedure as outlined above. Since his last visit, he reports he's been feeling great from a cardiac standpoint without any new chest pain or shortness of breath. He is primarily limited by his hip pain but able to perform ADLs without any cardiac limitations. States his BP has been running 120/70s and HR 60s-70s recently when checked.   Home Medications    Prior to  Admission medications   Medication Sig Start Date End Date Taking? Authorizing Provider  acetaminophen (TYLENOL) 650 MG CR tablet Take 1,300 mg by mouth in the morning and at bedtime.    [provider]  ALPRAZolam Duanne Moron) 0.5 MG tablet Take 0.5 mg by mouth 2 (two) times daily as needed for anxiety.    [provider]  aspirin EC 81 MG tablet Take 1 tablet (81 mg total) by mouth daily. 04/09/19   Camnitz, Ocie Doyne, MD  calcium carbonate (OS-CAL - DOSED IN MG OF ELEMENTAL CALCIUM) 1250 (500 Ca) MG tablet Take 2 tablets by mouth 2 (two) times daily with a meal.    [provider]  carvedilol (COREG) 25 MG tablet Take 1 tablet (25 mg total) by mouth 2 (two) times daily. 04/06/21   Revankar, Reita Cliche, MD  FARXIGA 10 MG TABS tablet Take 10 mg by mouth daily. 07/10/21   [provider]  fluticasone (FLONASE) 50 MCG/ACT nasal spray Place 1 spray into both nostrils daily as needed for allergies or rhinitis.    [provider]  Cleda Clarks  200 UNIT/ML KwikPen Inject 35 Units into the skin 3 (three) times daily with meals. 20-35 per meal 11/19/19   [provider]  Menthol, Topical Analgesic, (BIOFREEZE) 4 % GEL Apply 1 application. topically daily as needed (pain).    [provider]  Multiple Vitamin (MULTIVITAMIN WITH MINERALS) TABS tablet Take 1 tablet by mouth daily.    [provider]  sacubitril-valsartan (ENTRESTO) 49-51 MG Take 1 tablet by mouth 2 (two) times daily. 04/06/21   Revankar, Reita Cliche, MD  SOLIQUA 100-33 UNT-MCG/ML SOPN Inject 30 Units into the muscle every evening. 03/09/21   [provider]  traMADol (ULTRAM) 50 MG tablet Take 100 mg by mouth every 12 (twelve) hours. 03/13/21   [provider]  zolpidem (AMBIEN) 10 MG tablet Take 10 mg by mouth at bedtime as needed for sleep.  01/26/13   [provider]    Physical Exam    Vital Signs:  GOLDIE DIMMER does not have vital signs  available for review today.  Given telephonic nature of communication, physical exam is limited. AAOx3. NAD. Normal affect.  Speech and respirations are unlabored.  Accessory Clinical Findings    None  Assessment & Plan    1.  Preoperative Cardiovascular Risk Assessment: Revised cardiac risk index is >11% indicating high CV risk at baseline; reviewed nuclear stress test with Dr. Geraldo Pitter who feels given the absence of ischemia and no recent cardiac symptoms, OK to proceed at moderate CV risk. Relayed the stress test result and recommendation to the patient. Given his history of heart failure, recommend monitoring rhythm on telemetry and volume status closely post-operatively. The original clearance request does not include any request to hold aspirin. The patient states he was already told to hold for 7 days. No specific contraindications exist from cardiac standpoint if needing to interrupt therapy. We typically advise that blood thinners be resumed when felt safe by performing physician.  The patient was advised that if he develops new symptoms prior to surgery to contact our office to arrange for a follow-up visit, and he verbalized understanding.   A copy of this note will be routed to requesting surgeon.  Time:   Today, I have spent 5 minutes with the patient with telehealth technology discussing medical history, symptoms, and management plan.     Charlie Pitter, PA-C  08/07/2021, 11:17 AM

## 2021-08-10 NOTE — Anesthesia Preprocedure Evaluation (Signed)
Anesthesia Evaluation    Airway        Dental   Pulmonary           Cardiovascular hypertension,      Neuro/Psych    GI/Hepatic   Endo/Other  diabetes  Renal/GU      Musculoskeletal   Abdominal   Peds  Hematology   Anesthesia Other Findings   Reproductive/Obstetrics                             Anesthesia Physical Anesthesia Plan  ASA:   Anesthesia Plan:    Post-op Pain Management:    Induction:   PONV Risk Score and Plan:   Airway Management Planned:   Additional Equipment:   Intra-op Plan:   Post-operative Plan:   Informed Consent:   Plan Discussed with:   Anesthesia Plan Comments: (See PAT note 08/06/2021, Lyndon Code, PA-C)        Anesthesia Quick Evaluation

## 2021-08-10 NOTE — H&P (Signed)
TOTAL HIP ADMISSION H&P  Patient is admitted for right total hip arthroplasty.  Subjective:  Chief Complaint: right hip pain  HPI: [name] Sosa, 61 y.o. male, has a history of pain and functional disability in the right hip(s) due to arthritis and patient has failed non-surgical conservative treatments for greater than 12 weeks to include NSAID's and/or analgesics, corticosteriod injections, viscosupplementation injections, flexibility and strengthening excercises, supervised PT with diminished ADL's post treatment, use of assistive devices, weight reduction as appropriate, and activity modification.  Onset of symptoms was gradual starting 5 years ago with gradually worsening course since that time.The patient noted no past surgery on the right hip(s).  Patient currently rates pain in the right hip at 10 out of 10 with activity. Patient has night pain, worsening of pain with activity and weight bearing, trendelenberg gait, pain that interfers with activities of daily living, and crepitus. Patient has evidence of subchondral cysts, subchondral sclerosis, periarticular osteophytes, and joint space narrowing by imaging studies. This condition presents safety issues increasing the risk of falls. There is no current active infection.  Patient Active Problem List   Diagnosis Date Noted   Lumbar spondylosis 04/02/2021   Primary localized osteoarthritis of pelvic region and thigh 04/02/2021   Adenomatous polyp of colon 05/14/2020   Chronic rhinitis 05/14/2020   Diabetic renal disease ([place]) 05/14/2020   History of adenomatous polyp of colon 05/14/2020   Hydrocele 05/14/2020   Long term (current) use of insulin ([place]) 05/14/2020   Pure hypercholesterolemia 05/14/2020   Sciatica, right side 05/14/2020   Skin sensation disturbance 05/14/2020   Subclinical hypothyroidism 05/14/2020   Testicular hypofunction 05/14/2020   Thyroid nodule 05/14/2020   Gastro-esophageal reflux disease without esophagitis  05/14/2020   Bariatric surgery status 05/14/2020   Body mass index (BMI) 45.0-49.9, adult ([place]) 05/14/2020   Obesity 05/14/2020   Anemia    Anxiety    Apnea, sleep    Cancer ([place])    Cardiomyopathy, nonischemic ([place])    Chronic kidney disease    Chronic systolic heart failure (HCC)    Claustrophobia    GERD (gastroesophageal reflux disease)    Hyperlipidemia    Hypertension    Multiple thyroid nodules    Non-Hodgkin lymphoma (H[facility])    Shortness of breath    Cardiomyopathy ([place]) 04/27/2018   Morbid obesity ([place]) 11/25/2017   Nonischemic cardiomyopathy ([place]) 11/25/2017   History of Lap Roux-en-Y gastric bypass 03/05/13 03/23/2013   IDDM (insulin dependent diabetes mellitus) 12/08/2012   Obstructive sleep apnea 12/08/2012   CKD (chronic kidney disease), stage III ([place]) 12/08/2012   Diabetes ([place]) 04/27/2012   Diffuse large cell lymphoma in remission ([place]) 04/27/2012   CHF (congestive heart failure) ([place]) 2003   Past Medical History:  Diagnosis Date   Adenomatous polyp of colon 05/14/2020   Anemia    Anxiety    HX OF ANXIETY ATTACKS--CAN'T SLEEP ON BACK-FEELS LIKE HE CAN'T BREATHE   Apnea, sleep    SEVERE OSA PER STUDY 12/01/12   Bariatric surgery status 05/14/2020   Body mass index (BMI) 45.0-49.9, adult ([place]) 05/14/2020   Cancer ([place])    HX OF NON HODGKIN'S LYMPHOMA -IN REMISSION   Cardiomyopathy ([place]) 04/27/2018   Cardiomyopathy, nonischemic ([place])    DX 2003 CARDIAC CATH, ECHO 2006 SHOWED EF IMPROVED TO 45-50%--PER CARDIOLOGY OFFICE NOTES DR. Linard Millers FROM 01/20/10   CHF (congestive heart failure) ([place]) 2003   Chronic kidney disease    HX OF CHRONIC KIDNEY DISEASE STAGE III - EVALUATED AT  [facility] IN THE PAST AND FELT TO BE RELATED TO NSAID'S AND CHEMO.  PT  STATES HE HAS NOT SEEN KIDNEY SPECIALIST AT [facility] IN OVER A YEAR.   Chronic rhinitis 63/03/6008   Chronic systolic heart failure ([place])    CARDIOLOGIST IS DR. Linard Millers   CKD (chronic kidney disease), stage III ([place])  12/08/2012   Claustrophobia    Diabetes ([place]) 04/27/2012   Diabetic renal disease ([place]) 05/14/2020   Diffuse large cell lymphoma in remission ([place]) 04/27/2012   Gastro-esophageal reflux disease without esophagitis 05/14/2020   GERD (gastroesophageal reflux disease)    NOT TAKING ANY MEDS FOR REFLUX   History of adenomatous polyp of colon 05/14/2020   History of Lap Roux-en-Y gastric bypass 03/05/13 03/23/2013   Hydrocele 05/14/2020   Hyperlipidemia    Hypertension    IDDM (insulin dependent diabetes mellitus) 12/08/2012   Long term (current) use of insulin ([place]) 05/14/2020   Morbid obesity ([place])    Multiple thyroid nodules    HX OF NODULES=PT STATES BIOPSY-TOLD ALL OKAY   Non-Hodgkin lymphoma ([place])    DX ABOUT 2007-TX'D WITH CHEMO-IN REMISSION   Nonischemic cardiomyopathy ([place]) 11/25/2017   Obesity 05/14/2020   Obstructive sleep apnea 12/08/2012   Pure hypercholesterolemia 05/14/2020   Sciatica, right side 05/14/2020   Shortness of breath    WALKING UPSTAIRS   Skin sensation disturbance 05/14/2020   Subclinical hypothyroidism 05/14/2020   Testicular hypofunction 05/14/2020   Thyroid nodule 05/14/2020    Past Surgical History:  Procedure Laterality Date   BIOPSY  09/25/2019   Procedure: BIOPSY;  Surgeon: Otis Brace, MD;  Location: [facility];  Service: Gastroenterology;;   COLONOSCOPY WITH PROPOFOL N/A 01/09/2013   Procedure: COLONOSCOPY WITH PROPOFOL;  Surgeon: Garlan Fair, MD;  [facility];  Service: Endoscopy;  Laterality: N/A;   COLONOSCOPY WITH PROPOFOL N/A 09/25/2019   Procedure: COLONOSCOPY WITH PROPOFOL;  Surgeon: Otis Brace, MD;  [facility];  Service: Gastroenterology;  Laterality: N/A;   GASTRIC ROUX-EN-Y N/A 03/05/2013   Procedure: LAPAROSCOPIC ROUX-EN-Y GASTRIC BYPASS WITH UPPER ENDOSCOPY;  Surgeon: Gayland Curry, MD;  [facility];  Service: General;  Laterality: N/A;   HYDROCELE EXCISION  02/09/2011   Procedure:  HYDROCELECTOMY ADULT;  Surgeon: Fredricka Bonine, MD;  [facility];  Service: Urology;  Laterality: Right;   POLYPECTOMY  09/25/2019   Procedure: POLYPECTOMY;  Surgeon: Otis Brace, MD;  [facility];  Service: Gastroenterology;;    No current facility-administered medications for this encounter.   Current Outpatient Medications  Medication Sig Dispense Refill Last Dose   acetaminophen (TYLENOL) 650 MG CR tablet Take 1,300 mg by mouth in the morning and at bedtime.      ALPRAZolam (XANAX) 0.5 MG tablet Take 0.5 mg by mouth 2 (two) times daily as needed for anxiety.      aspirin EC 81 MG tablet Take 1 tablet (81 mg total) by mouth daily. 90 tablet 3    calcium carbonate (OS-CAL - DOSED IN MG OF ELEMENTAL CALCIUM) 1250 (500 Ca) MG tablet Take 2 tablets by mouth 2 (two) times daily with a meal.      carvedilol (COREG) 25 MG tablet Take 1 tablet (25 mg total) by mouth 2 (two) times daily. 180 tablet 3    FARXIGA 10 MG TABS tablet Take 10 mg by mouth daily.      fluticasone (FLONASE) 50 MCG/ACT nasal spray Place 1 spray into both nostrils daily as needed for allergies or rhinitis.  HUMALOG KWIKPEN 200 UNIT/ML KwikPen Inject 35 Units into the skin 3 (three) times daily with meals. 20-35 per meal      Menthol, Topical Analgesic, (BIOFREEZE) 4 % GEL Apply 1 application. topically daily as needed (pain).      Multiple Vitamin (MULTIVITAMIN WITH MINERALS) TABS tablet Take 1 tablet by mouth daily.      sacubitril-valsartan (ENTRESTO) 49-51 MG Take 1 tablet by mouth 2 (two) times daily. 180 tablet 3    SOLIQUA 100-33 UNT-MCG/ML SOPN Inject 30 Units into the muscle every evening.      traMADol (ULTRAM) 50 MG tablet Take 100 mg by mouth every 12 (twelve) hours.      zolpidem (AMBIEN) 10 MG tablet Take 10 mg by mouth at bedtime as needed for sleep.       Allergies  Allergen Reactions   Aciphex [Rabeprazole Sodium]     Doesn't work    Acyclovir And Related     unkn   Nsaids      AVOIDS NSAID'S DUE TO HX OF CHRONIC KIDNEY DISEASE    Social History   Tobacco Use   Smoking status: Never   Smokeless tobacco: Never  Substance Use Topics   Alcohol use: Yes    Comment: RARELY    Family History  Adopted: Yes  Family history unknown: Yes     Review of Systems  Musculoskeletal:  Positive for arthralgias.       Right hip  All other systems reviewed and are negative.  Objective:  Physical Exam Constitutional:      Appearance: Normal appearance.  HENT:     Head: Normocephalic and atraumatic.     Nose: Nose normal.     Mouth/Throat:     Pharynx: Oropharynx is clear.  Eyes:     Extraocular Movements: Extraocular movements intact.  Cardiovascular:     Rate and Rhythm: Normal rate.  Pulmonary:     Effort: Pulmonary effort is normal.  Abdominal:     Palpations: Abdomen is soft.  Musculoskeletal:     Cervical back: Normal range of motion.     Comments: Examination right hip shows extremely limited range of motion with a terrible plane with any internal or external rotation.  He walks with a significant limp.  He does continue with some central obesity.    Skin:    General: Skin is warm and dry.  Neurological:     General: No focal deficit present.     Mental Status: He is alert and oriented to person, place, and time. Mental status is at baseline.  Psychiatric:        Mood and Affect: Mood normal.        Behavior: Behavior normal.        Thought Content: Thought content normal.        Judgment: Judgment normal.    Vital signs in last 24 hours:    Labs:   Estimated body mass index is 42.72 kg/m as calculated from the following:   Height as of 08/05/21: 6' (1.829 m).   Weight as of 08/05/21: 142.9 kg.   Imaging Review Plain radiographs demonstrate severe degenerative joint disease of the right hip(s). The bone quality appears to be good for age and reported activity level.      Assessment/Plan:  End stage primary arthritis, right  hip(s)  The patient history, physical examination, clinical judgement of the provider and imaging studies are consistent with end stage degenerative joint disease of the right hip(s) and  total hip arthroplasty is deemed medically necessary. The treatment options including medical management, injection therapy, arthroscopy and arthroplasty were discussed at length. The risks and benefits of total hip arthroplasty were presented and reviewed. The risks due to aseptic loosening, infection, stiffness, dislocation/subluxation,  thromboembolic complications and other imponderables were discussed.  The patient acknowledged the explanation, agreed to proceed with the plan and consent was signed. Patient is being admitted for inpatient treatment for surgery, pain control, PT, OT, prophylactic antibiotics, VTE prophylaxis, progressive ambulation and ADL's and discharge planning.The patient is planning to be discharged home with home health services

## 2021-08-11 ENCOUNTER — Ambulatory Visit (HOSPITAL_COMMUNITY)
Admission: RE | Admit: 2021-08-11 | Payer: BC Managed Care – PPO | Source: Home / Self Care | Admitting: Orthopaedic Surgery

## 2021-08-11 ENCOUNTER — Encounter (HOSPITAL_COMMUNITY): Admission: RE | Payer: Self-pay | Source: Home / Self Care

## 2021-08-11 ENCOUNTER — Other Ambulatory Visit: Payer: Self-pay | Admitting: Internal Medicine

## 2021-08-11 DIAGNOSIS — E049 Nontoxic goiter, unspecified: Secondary | ICD-10-CM

## 2021-08-11 LAB — TYPE AND SCREEN
ABO/RH(D): B NEG
Antibody Screen: NEGATIVE

## 2021-08-11 SURGERY — ARTHROPLASTY, HIP, TOTAL, ANTERIOR APPROACH
Anesthesia: Spinal | Site: Hip | Laterality: Right

## 2021-08-11 NOTE — Progress Notes (Signed)
Patient had been notified that his surgery for today has been cancelled.  Patient is to receive a phone call from the anesthesiologist today about the reason he is being cancelled.  Patient verbalized understanding and will await phone call from anesthesiologist.  Discussed with Dr. Deatra Canter about conversation with patient  Jasmine Pang BSN, RN RN Speciality Coordinator - Perioperative Services [facility]

## 2021-08-13 ENCOUNTER — Ambulatory Visit
Admission: RE | Admit: 2021-08-13 | Discharge: 2021-08-13 | Disposition: A | Discharge status: Home / Self Care | Payer: BC Managed Care – PPO | Source: Ambulatory Visit | Attending: Internal Medicine | Admitting: Internal Medicine

## 2021-08-13 DIAGNOSIS — E049 Nontoxic goiter, unspecified: Secondary | ICD-10-CM

## 2021-08-13 DIAGNOSIS — E042 Nontoxic multinodular goiter: Secondary | ICD-10-CM | POA: Diagnosis not present

## 2021-08-24 DIAGNOSIS — E049 Nontoxic goiter, unspecified: Secondary | ICD-10-CM | POA: Diagnosis not present

## 2021-09-03 DIAGNOSIS — J398 Other specified diseases of upper respiratory tract: Secondary | ICD-10-CM | POA: Diagnosis not present

## 2021-09-03 DIAGNOSIS — E049 Nontoxic goiter, unspecified: Secondary | ICD-10-CM | POA: Diagnosis not present

## 2021-09-07 ENCOUNTER — Other Ambulatory Visit: Payer: Self-pay

## 2021-09-07 DIAGNOSIS — G4733 Obstructive sleep apnea (adult) (pediatric): Secondary | ICD-10-CM | POA: Diagnosis not present

## 2021-09-07 DIAGNOSIS — E041 Nontoxic single thyroid nodule: Secondary | ICD-10-CM

## 2021-09-08 ENCOUNTER — Other Ambulatory Visit: Payer: Self-pay | Admitting: Surgery

## 2021-09-08 DIAGNOSIS — E041 Nontoxic single thyroid nodule: Secondary | ICD-10-CM

## 2021-09-11 ENCOUNTER — Other Ambulatory Visit (HOSPITAL_COMMUNITY)
Admission: RE | Admit: 2021-09-11 | Discharge: 2021-09-11 | Disposition: A | Discharge status: Home / Self Care | Payer: BC Managed Care – PPO | Source: Ambulatory Visit | Attending: Diagnostic Radiology | Admitting: Diagnostic Radiology

## 2021-09-11 ENCOUNTER — Ambulatory Visit
Admission: RE | Admit: 2021-09-11 | Discharge: 2021-09-11 | Disposition: A | Discharge status: Home / Self Care | Payer: BC Managed Care – PPO | Source: Ambulatory Visit | Attending: Surgery | Admitting: Surgery

## 2021-09-11 DIAGNOSIS — E041 Nontoxic single thyroid nodule: Secondary | ICD-10-CM

## 2021-09-11 DIAGNOSIS — E042 Nontoxic multinodular goiter: Secondary | ICD-10-CM | POA: Diagnosis not present

## 2021-09-14 LAB — CYTOLOGY - NON PAP

## 2021-09-21 ENCOUNTER — Ambulatory Visit: Payer: Self-pay | Admitting: Surgery

## 2021-09-21 NOTE — Progress Notes (Signed)
Biopsies of the right thyroid nodules are both benign.  I think the safest course would be to move forward with left thyroid lobectomy as we discussed in the office.  I will enter orders and send to schedulers to contact the patient.  Armandina Gemma, MD [facility] [place] [facility] Office: [phone]

## 2021-10-01 DIAGNOSIS — E049 Nontoxic goiter, unspecified: Secondary | ICD-10-CM | POA: Diagnosis not present

## 2021-10-01 DIAGNOSIS — E0590 Thyrotoxicosis, unspecified without thyrotoxic crisis or storm: Secondary | ICD-10-CM | POA: Diagnosis not present

## 2021-10-01 DIAGNOSIS — M1611 Unilateral primary osteoarthritis, right hip: Secondary | ICD-10-CM | POA: Diagnosis not present

## 2021-10-01 DIAGNOSIS — E1122 Type 2 diabetes mellitus with diabetic chronic kidney disease: Secondary | ICD-10-CM | POA: Diagnosis not present

## 2021-10-01 DIAGNOSIS — H00012 Hordeolum externum right lower eyelid: Secondary | ICD-10-CM | POA: Diagnosis not present

## 2021-10-05 NOTE — Patient Instructions (Addendum)
DUE TO SPACE LIMITATIONS, ONLY TWO VISITORS  (aged 63 and older) ARE ALLOWED TO COME WITH YOU AND STAY IN THE WAITING ROOM DURING YOUR PRE OP AND PROCEDURE.   **NO VISITORS ARE ALLOWED IN THE SHORT STAY AREA OR RECOVERY ROOM!!**  IF YOU WILL BE ADMITTED INTO THE HOSPITAL YOU ARE ALLOWED ONLY FOUR SUPPORT PEOPLE DURING VISITATION HOURS (7 AM -8PM)   The support person(s) must pass our screening, and use Hand sanitizing gel. Visitors GUEST BADGE MUST BE WORN VISIBLY  One adult visitor may remain with you overnight and MUST be in the room by 8 P.M.   You are not required to quarantine at this time prior to your surgery. However, you must do this: Hand Hygiene often Do NOT share personal items Notify your provider if you are in close contact with someone who has COVID or you develop fever 100.4 or greater, new onset of sneezing, cough, sore throat, shortness of breath or body aches.       Your procedure is scheduled on:  Friday October 16, 2021  Report to [facility] Main Entrance.  Report to admitting at: 09:15 AM  +++++Call this number if you have any questions or problems the morning of surgery [phone]  Do not eat food :After Midnight the night prior to your surgery/procedure.  After Midnight you may have the following liquids until   08:30 AM DAY OF SURGERY  Clear Liquid Diet Water Black Coffee (sugar ok, NO MILK/CREAM OR CREAMERS)  Tea (sugar ok, NO MILK/CREAM OR CREAMERS) regular and decaf                             Plain Jell-O (NO RED)                                           Fruit ices (not with fruit pulp, NO RED)                                     Popsicles (NO RED)                                                                  Juice: apple, WHITE grape, WHITE cranberry Sports drinks like Gatorade (NO RED) Clear broth(vegetable,chicken,beef)              FOLLOW  ANY ADDITIONAL PRE OP INSTRUCTIONS YOU RECEIVED FROM YOUR SURGEON'S OFFICE!!!   Oral Hygiene  is also important to reduce your risk of infection.        Remember - BRUSH YOUR TEETH THE MORNING OF SURGERY WITH YOUR REGULAR TOOTHPASTE  Take ONLY these medicines the morning of surgery with A SIP OF WATER: Tramadol, Tylenol, Xanax, Flonase  How to Manage Your Diabetes Before and After Surgery  Why is it important to control my blood sugar before and after surgery? Improving blood sugar levels before and after surgery helps healing and can limit problems. A way of improving blood sugar control is eating a healthy diet by:  Eating less  sugar and carbohydrates  Increasing activity/exercise  Talking with your doctor about reaching your blood sugar goals High blood sugars (greater than 180 mg/dL) can raise your risk of infections and slow your recovery, so you will need to focus on controlling your diabetes during the weeks before surgery. Make sure that the doctor who takes care of your diabetes knows about your planned surgery including the date and location.  How do I manage my blood sugar before surgery? Check your blood sugar at least 4 times a day, starting 2 days before surgery, to make sure that the level is not too high or low. Check your blood sugar the morning of your surgery when you wake up and every 2 hours until you get to the Short Stay unit. If your blood sugar is less than 70 mg/dL, you will need to treat for low blood sugar: Do not take insulin. Treat a low blood sugar (less than 70 mg/dL) with  cup of clear juice (cranberry or apple), 4 glucose tablets, OR glucose gel. Recheck blood sugar in 15 minutes after treatment (to make sure it is greater than 70 mg/dL). If your blood sugar is not greater than 70 mg/dL on recheck, call [phone] for further instructions. Report your blood sugar to the short stay nurse when you get to Short Stay.  If you are admitted to the hospital after surgery: Your blood sugar will be checked by the staff and you will probably be given  insulin after surgery (instead of oral diabetes medicines) to make sure you have good blood sugar levels. The goal for blood sugar control after surgery is 80-180 mg/dL.   WHAT DO I DO ABOUT MY DIABETES MEDICATION?  THE DAY BEFORE SURGERY, take usual dose of Soliqua insulin in the morning, DO NOT take Soliqua in the evening.                      Humalog Take usual dose with meals but DO NOT take dose at bedtime.                    Wilder Glade  DO NOT Take Wilder Glade the day before surgery.   THE MORNING OF SURGERY, DO NOT TAKE SOLIQUA, DO NOT TAKE FARXIGA.        DO NOT take HUMALOG the morning of surgery EXCEPT:   If your CBG is greater than 220 mg/dL, you may take  of your sliding scale (correction) dose of insulin.     Bring CPAP mask and tubing day of surgery.                   You may not have any metal on your body including jewelry, and body piercing  Do not wear lotions, powders, cologne, or deodorant  Men may shave face and neck.  Contacts, Hearing Aids, dentures or bridgework may not be worn into surgery.   You may bring a small overnight bag with you on the day of surgery, only pack items that are not valuable .[facility] IS NOT RESPONSIBLE   FOR VALUABLES THAT ARE LOST OR STOLEN.   DO NOT [place]. PHARMACY WILL DISPENSE MEDICATIONS LISTED ON YOUR MEDICATION LIST TO YOU DURING YOUR ADMISSION [place]!   Special Instructions: Bring a copy of your healthcare power of attorney and living will documents the day of surgery, if you wish to have them scanned into your [place]  Please read over the following fact sheets you were given: IF YOU HAVE QUESTIONS ABOUT YOUR PRE-OP INSTRUCTIONS, PLEASE CALL [phone]  ([place])   [facility] - Preparing for Surgery Before surgery, you can play an important role.  Because skin is not sterile, your skin needs to be as free of germs as possible.  You can reduce the number of  germs on your skin by washing with CHG (chlorahexidine gluconate) soap before surgery.  CHG is an antiseptic cleaner which kills germs and bonds with the skin to continue killing germs even after washing. Please DO NOT use if you have an allergy to CHG or antibacterial soaps.  If your skin becomes reddened/irritated stop using the CHG and inform your nurse when you arrive at Short Stay. Do not shave (including legs and underarms) for at least 48 hours prior to the first CHG shower.  You may shave your face/neck.  Please follow these instructions carefully:  1.  Shower with CHG Soap the night before surgery and the  morning of surgery.  2.  If you choose to wash your hair, wash your hair first as usual with your normal  shampoo.  3.  After you shampoo, rinse your hair and body thoroughly to remove the shampoo.                             4.  Use CHG as you would any other liquid soap.  You can apply chg directly to the skin and wash.  Gently with a scrungie or clean washcloth.  5.  Apply the CHG Soap to your body ONLY FROM THE NECK DOWN.   Do not use on face/ open                           Wound or open sores. Avoid contact with eyes, ears mouth and genitals (private parts).                       Wash face,  Genitals (private parts) with your normal soap.             6.  Wash thoroughly, paying special attention to the area where your  surgery  will be performed.  7.  Thoroughly rinse your body with warm water from the neck down.  8.  DO NOT shower/wash with your normal soap after using and rinsing off the CHG Soap.            9.  Pat yourself dry with a clean towel.            10.  Wear clean pajamas.            11.  Place clean sheets on your bed the night of your first shower and do not  sleep with pets.  ON THE DAY OF SURGERY : Do not apply any lotions/deodorants the morning of surgery.  Please wear clean clothes to the hospital/surgery center.    FAILURE TO FOLLOW THESE INSTRUCTIONS MAY  RESULT IN THE CANCELLATION OF YOUR SURGERY  PATIENT SIGNATURE_________________________________  NURSE SIGNATURE__________________________________  ________________________________________________________________________

## 2021-10-05 NOTE — Progress Notes (Signed)
COVID Vaccine received:  '[]'$  No '[x]'$  Yes  Date of any COVID positive Test in last 41 days:None  PCP - Lavone Orn, MD      Cardiologist - Jyl Heinz, MD   [place], MD (no device, medical mgmt)  Chest x-ray - 08-06-21  Epic CT Chest - 08-06-21  Epic EKG -  08-05-21  Epic Stress Test - 08-05-21  Epic ECHO - 09-19-20  Epic Cardiac Cath - 2003  Pacemaker/ICD device last checked: Date:       '[x]'$  N/A Spinal Cord Stimulator:'[x]'$  No '[]'$  Yes   Other Implants:   History of Sleep Apnea? '[]'$  No '[x]'$  Yes  '[]'$  unknown Sleep Study Date:   CPAP used?- '[]'$  No '[x]'$  Yes  (Instruct to bring their mask & Tubing)  Does the patient monitor blood sugar? '[]'$  No '[x]'$  Yes  '[]'$  N/A Fasting Blood Sugar Ranges- 70-190 Checks Blood Sugar __2_ times a day  Blood Thinner Instructions:  ASA  Aspirin Instructions:Hold 7 days per patient Last Dose:  Patient's blood was hemolyzed per the lab, Janett Billow is aware. Patient will have his CBC and CMP redrawn on DOS. His A1C will not be redrawn ( 08-05-21 A1C was 6.3)  Activity level:  Patient can not go up a flight of stairs and perform activities of daily living without stopping due to Hip pain and  SHOB;  No chest pain.       Comments: Hx Gastric bypass RNY 2014, Large cell lymphoma in remission,   BARIBED was requested: patient is 6'1" and weighs 341 lbs.   Anesthesia review: Trachial deviation, OSA, NICM,CHF,   Patient denies shortness of breath, fever, cough and chest pain at PAT appointment Patient verbalized understanding and agreement to the Pre-Surgical Instructions that were given to them at this PAT appointment. Patient was also educated of the need to review these PAT instructions again prior to his/her surgery.I reviewed the appropriate phone numbers to call if they have any and questions or concerns.

## 2021-10-06 ENCOUNTER — Encounter (HOSPITAL_COMMUNITY): Payer: Self-pay

## 2021-10-06 ENCOUNTER — Encounter (HOSPITAL_COMMUNITY)
Admission: RE | Admit: 2021-10-06 | Discharge: 2021-10-06 | Disposition: A | Discharge status: Home / Self Care | Payer: BC Managed Care – PPO | Source: Ambulatory Visit | Attending: Surgery | Admitting: Surgery

## 2021-10-06 ENCOUNTER — Other Ambulatory Visit: Payer: Self-pay

## 2021-10-06 VITALS — BP 123/78 | HR 78 | Temp 97.7°F | Resp 24 | Ht 73.0 in | Wt 340.0 lb

## 2021-10-06 DIAGNOSIS — K769 Liver disease, unspecified: Secondary | ICD-10-CM | POA: Diagnosis not present

## 2021-10-06 DIAGNOSIS — Z794 Long term (current) use of insulin: Secondary | ICD-10-CM

## 2021-10-06 DIAGNOSIS — E119 Type 2 diabetes mellitus without complications: Secondary | ICD-10-CM | POA: Diagnosis not present

## 2021-10-06 DIAGNOSIS — I10 Essential (primary) hypertension: Secondary | ICD-10-CM | POA: Diagnosis not present

## 2021-10-06 DIAGNOSIS — Z01812 Encounter for preprocedural laboratory examination: Secondary | ICD-10-CM | POA: Diagnosis not present

## 2021-10-06 HISTORY — DX: Unspecified osteoarthritis, unspecified site: M19.90

## 2021-10-06 LAB — GLUCOSE, CAPILLARY: Glucose-Capillary: 107 mg/dL — ABNORMAL HIGH (ref 70–99)

## 2021-10-06 LAB — HEMOGLOBIN A1C
Hgb A1c MFr Bld: 6.3 % — ABNORMAL HIGH (ref 4.8–5.6)
Mean Plasma Glucose: 134.11 mg/dL

## 2021-10-07 DIAGNOSIS — E0590 Thyrotoxicosis, unspecified without thyrotoxic crisis or storm: Secondary | ICD-10-CM | POA: Diagnosis not present

## 2021-10-07 DIAGNOSIS — G4733 Obstructive sleep apnea (adult) (pediatric): Secondary | ICD-10-CM | POA: Diagnosis not present

## 2021-10-08 ENCOUNTER — Encounter (HOSPITAL_COMMUNITY): Payer: Self-pay | Admitting: Surgery

## 2021-10-08 DIAGNOSIS — J398 Other specified diseases of upper respiratory tract: Secondary | ICD-10-CM | POA: Diagnosis present

## 2021-10-08 DIAGNOSIS — E049 Nontoxic goiter, unspecified: Secondary | ICD-10-CM | POA: Diagnosis present

## 2021-10-08 HISTORY — DX: Nontoxic goiter, unspecified: E04.9

## 2021-10-08 HISTORY — DX: Other specified diseases of upper respiratory tract: J39.8

## 2021-10-08 NOTE — H&P (Signed)
REFERRING PHYSICIAN: Irven Shelling, MD  PROVIDER: TODD Charlotta Newton, MD    Chief Complaint: New Consultation (Asymmetric thyroid goiter with tracheal deviation)  History of Present Illness:  Patient is referred by Dr. Lavone Orn for surgical evaluation and management of a large asymmetric thyroid goiter with substernal component resulting in significant tracheal deviation. Patient is also followed by Dr. Braulio Bosch from orthopedic surgery. Patient had been scheduled for hip surgery. Concerns were raised by anesthesiology due to abnormal chest x-ray and subsequent abnormal CT scan of the chest demonstrating a large thyroid goiter with a left substernal component resulting in significant tracheal deviation. Patient has a history of thyroid disease dating back approximately 25 years when he was evaluated by endocrinology. At that time he states he underwent needle biopsy and was told the results were benign. He has never been on thyroid medication. He denies any significant dyspnea or dysphagia. Recent chest CT demonstrated a large substernal goiter on the left with tracheal deviation to the right of approximately 3 cm. Patient underwent an ultrasound examination on Aug 13, 2021. This showed an enlarged right thyroid lobe measuring 5.3 x 2.8 x 2.3 cm and a markedly enlarged left thyroid lobe measuring 9.2 x 4.6 x 6.4 cm. There were dominant nodules bilaterally which met criteria for fine-needle aspiration biopsy. These have not been performed. Patient has had no prior head or neck surgery. He presents today to discuss further evaluation and management of multinodular substernal thyroid goiter.  Review of Systems: A complete review of systems was obtained from the patient. I have reviewed this information and discussed as appropriate with the patient. See HPI as well for other ROS.  Review of Systems  Constitutional: Negative.  HENT: Negative.  Eyes: Negative.  Respiratory:   Sleep apnea  Cardiovascular: Negative.  Gastrointestinal: Negative.  Genitourinary: Negative.  Musculoskeletal: Positive for joint pain.  Skin: Negative.  Neurological: Negative.  Endo/Heme/Allergies: Negative.  Psychiatric/Behavioral: Negative.   Medical History: Past Medical History:  Diagnosis Date  Anxiety  Arthritis  CHF (congestive heart failure) (CMS-HCC)  Chronic kidney disease  Diabetes mellitus without complication (CMS-HCC)  History of cancer  Sleep apnea   Patient Active Problem List  Diagnosis  Tracheal deviation  Substernal thyroid goiter   Past Surgical History:  Procedure Laterality Date  gatric bypass  HYDROCELE EXCISION / REPAIR    No Known Allergies  Current Outpatient Medications on File Prior to Visit  Medication Sig Dispense Refill  ENTRESTO 49-51 mg tablet Take 1 tablet by mouth 2 (two) times daily  ALPRAZolam (XANAX) 0.5 MG tablet Take 0.5 mg by mouth 2 (two) times daily as needed  aspirin 81 MG EC tablet Take by mouth once daily  carvedilol (COREG) 25 MG tablet 1 tab by mouth 2 times a day  glimepiride (AMARYL) 4 MG tablet 4 mg  glucose blood (ONE TOUCH ULTRA TEST) test strip  glucose blood test strip  insulin glargine (LANTUS SOLOSTAR) 100 unit/mL (3 mL) pen injector 100 unit/mL (3 mL)  insulin lispro (HUMALOG KWIKPEN) 100 unit/mL pen injector  insulin lispro (HUMALOG) 100 unit/mL injection 100 unit/mL  insulin needles, disposable, (BD INSULIN PEN NEEDLE UF MINI) 31 x 3/16 " needle 31 x 3/16 "  insulin needles, disposable, (BD INSULIN PEN NEEDLE UF SHORT) 31 X 5/16 " needle 31 X 5/16 "  insulin syringe-needle U-100 (BD INSULIN SYRINGE ULT-FINE II) 0.3 mL 31 x 5/16" syringe 0.3 mL 31 x 5/16"  rabeprazole (ACIPHEX) 20 mg  EC tablet 1 tab by mouth daily  ramipril (ALTACE) 10 MG capsule 1 cap by mouth 2 times a day  sildenafil (VIAGRA) 100 MG tablet  sildenafil (VIAGRA) 50 MG tablet 50 mg  testosterone (ANDROGEL) 1.25 gram/actuation topical  gel 1.25 g/Actuation  traMADoL (ULTRAM) 50 mg tablet TAKE TWO TABLETS BY MOUTH EVERY 12 HOURS AS NEEDED  zolpidem (AMBIEN) 10 mg tablet 10 mg   No current facility-administered medications on file prior to visit.   History reviewed. No pertinent family history.   Social History   Tobacco Use  Smoking Status Never  Smokeless Tobacco Never    Social History   Socioeconomic History  Marital status: Single  Tobacco Use  Smoking status: Never  Smokeless tobacco: Never  Vaping Use  Vaping Use: Never used  Substance and Sexual Activity  Alcohol use: Yes  Drug use: Never   Objective:   Vitals:  BP: (!) 148/86  Pulse: 102  Temp: 36.8 C (98.2 F)  SpO2: 96%  Weight: (!) 151.9 kg (334 lb 12.8 oz)  Height: 185.4 cm (6' 1")   Body mass index is 44.17 kg/m.  Physical Exam   GENERAL APPEARANCE Comfortable, no acute issues Development: normal Gross deformities: none  SKIN Rash, lesions, ulcers: none Induration, erythema: none Nodules: none palpable  EYES Conjunctiva and lids: normal Pupils: equal and reactive  EARS, NOSE, MOUTH, THROAT External ears: no lesion or deformity External nose: no lesion or deformity Hearing: grossly normal  NECK Symmetric: no Trachea: Apparent deviation to the right Thyroid: Right thyroid lobe shows a smooth rounded nodule in the mid and upper portion of the lobe which is mobile and nontender. Left thyroid lobe shows a marked enlargement extending beneath the left clavicle without discrete nodule palpable. There is no tenderness. There is no associated lymphadenopathy.  CHEST Respiratory effort: normal Retraction or accessory muscle use: no Breath sounds: normal bilaterally Rales, rhonchi, wheeze: none  CARDIOVASCULAR Auscultation: regular rhythm, normal rate Murmurs: none Pulses: radial pulse 2+ palpable Lower extremity edema: 2+ bilateral  ABDOMEN Not assessed  GENITOURINARY Not assessed  MUSCULOSKELETAL Station and  gait: uses cane Digits and nails: no clubbing or cyanosis Muscle strength: grossly normal all extremities Range of motion: grossly normal all extremities Deformity: none  LYMPHATIC Cervical: none palpable Supraclavicular: none palpable  PSYCHIATRIC Oriented to person, place, and time: yes Mood and affect: normal for situation Judgment and insight: appropriate for situation    Assessment and Plan:   Substernal thyroid goiter  Tracheal deviation  Patient is referred by his primary care physician and his orthopedic surgeon for surgical evaluation of a lump sternal thyroid goiter with tracheal deviation. This is resulting in delay of his hip surgery due to anesthesia concerns.  Patient provided with a copy of "The Thyroid Book: Medical and Surgical Treatment of Thyroid Problems", published by [employer], 16 pages. Book reviewed and explained to patient during visit today.  Today we reviewed his CT scan results and his ultrasound results. We discussed his clinical history. I think it would be best to go ahead with the biopsies of the dominant bilateral thyroid nodules as recommended by radiology. If these are both benign, then we can make a case for doing the left thyroid lobectomy in order to remove the substernal component and relieve the tracheal deviation. The right thyroid lobe is at the upper limits of normal size given a man of his size. If the biopsies demonstrate evidence of malignancy on either side, then I think the patient should  undergo total thyroidectomy at the time of surgery. We discussed the risk and benefits of the procedure including the size of the surgical incision, the risk of recurrent laryngeal nerve injury with permanent changes in voice quality, and potential for injury to parathyroid glands with disruption of calcium metabolism. The patient understands and would like to proceed. We will make arrangements for fine-needle aspiration biopsy in the near future. I will  contact him with those results. We will make a decision at that time regarding moving forward with surgical resection.  Patient will need to be seen preoperatively by anesthesiology. They will need to consider a strategy for airway management at the time of thyroid surgery. The patient and I discussed this briefly today.  Patient will undergo fine-needle aspiration biopsy of the bilateral thyroid nodules. We will also obtain a TSH level at this time. We will contact the patient with the results of the studies when they are available.  Armandina Gemma, MD [facility] A [place] practice Office: [phone]

## 2021-10-15 NOTE — Progress Notes (Signed)
Anesthesia Chart Review   Case: [id number] Date/Time: 10/16/21 1115   Procedure: LEFT THYROID LOBECTOMY (Left)   Anesthesia type: General   Pre-op diagnosis: SUBSTERNAL THYROID GOITER WITH TRACHEAL DEVIATION   Location: [facility] ROOM 01 / [facility]   Surgeons: Armandina Gemma, MD       DISCUSSION:60 y.o.  never smoker with h/o CHF, sleep apnea, Non-Hodgkin Lymphoma, DM II (A1C 6.3), CKD Stage III, substernal thyroid goiter with tracheal deviation scheduled for above procedure 10/16/2021 with Dr. Armandina Gemma.   Pt previously scheduled for hip surgery.  Chest xray at PAT visit with mediastinal mass, further evaluation with CT with enlarged left thyroid with tracheal deviation 3 cm to the right.    Patient underwent an ultrasound examination on Aug 13, 2021. This showed an enlarged right thyroid lobe measuring 5.3 x 2.8 x 2.3 cm and a markedly enlarged left thyroid lobe measuring 9.2 x 4.6 x 6.4 cm. Fine needle aspiration biopsy benign.   Pt had cardiology evaluation prior to previously planned hip surgery.   Pt last seen by cardiology 04/02/2021. Per OV note, "I told him that if he needs hip surgery I would consider getting a stress test done for restratification in view of multiple risk factors and is agreeable.  At the appropriate time he will contact me for this."   Low risk stress test 08/05/2021.    Seen by cardiology 08/07/2021. Per OV note, "Preoperative Cardiovascular Risk Assessment: Revised cardiac risk index is >11% indicating high CV risk at baseline; reviewed nuclear stress test with Dr. Geraldo Pitter who feels given the absence of ischemia and no recent cardiac symptoms, OK to proceed at moderate CV risk. Relayed the stress test result and recommendation to the patient. Given his history of heart failure, recommend monitoring rhythm on telemetry and volume status closely post-operatively. The original clearance request does not include any request to hold aspirin. The patient states he was already told  to hold for 7 days. No specific contraindications exist from cardiac standpoint if needing to interrupt therapy. We typically advise that blood thinners be resumed when felt safe by performing physician."  Anticipate pt can proceed with planned procedure barring acute status change.   VS: BP 123/78   Pulse 78   Temp 36.5 C (Oral)   Resp (!) 24   Ht '6\' 1"'$  (1.854 m)   Wt (!) 154.2 kg   SpO2 98%   BMI 44.86 kg/m   PROVIDERS: Lavone Orn, MD is PCP   Primary Cardiologist:  Jenean Lindau, MD LABS: Labs reviewed: Acceptable for surgery. (all labs ordered are listed, but only abnormal results are displayed)  Labs Reviewed  HEMOGLOBIN A1C - Abnormal; Notable for the following components:      Result Value   Hgb A1c MFr Bld 6.3 (*)    All other components within normal limits  GLUCOSE, CAPILLARY - Abnormal; Notable for the following components:   Glucose-Capillary 107 (*)    All other components within normal limits     IMAGES: CT Chest 08/06/2021 IMPRESSION: 1. The left thyroid is heterogeneous and enlarged extending into the superior mediastinum. There is tracheal deviation to the right 3 cm. This finding accounts for x-ray abnormality. Thyroid gland has increased in size compared to 2007. 2. No other acute cardiopulmonary process. 3. New/enlarging hypodense lesion in the liver and indeterminate right adrenal nodule. These can be further evaluated with ultrasound or MRI.  Chest Xray 08/05/2021 IMPRESSION: 1. Potential superior mediastinal mass. Further evaluation with contrast enhanced  chest CT is strongly recommended at this time to exclude neoplasm.    EKG: 08/05/2021 Rate 78 bpm  NSR LAD No significant change since last tracing  CV: Myocardial Perfusion 08/06/2021   Findings are consistent with no prior ischemia. The study is high risk.   No ST deviation was noted.   Left ventricular function is abnormal. Global function is severely reduced. Nuclear stress  EF: 28 %. The left ventricular ejection fraction is severely decreased (<30%). End diastolic cavity size is severely enlarged. End systolic cavity size is severely enlarged.   Prior study not available for comparison.   Abnormal, high risk stress nuclear study with thinning of the inferolateral wall versus prior infarct; no ischemia; gated ejection fraction 28% with global hypokinesis.  Severe left ventricular enlargement noted.  Study interpreted as high risk due to reduced LV function.   Echo 09/19/20 1. Left ventricular ejection fraction, by estimation, is 30 to 35%. The  left ventricle has moderately decreased function. The left ventricle has  no regional wall motion abnormalities. Left ventricular diastolic  parameters are consistent with Grade II  diastolic dysfunction (pseudonormalization).  Past Medical History:  Diagnosis Date   Adenomatous polyp of colon 05/14/2020   Anemia    Anxiety    HX OF ANXIETY ATTACKS--CAN'T SLEEP ON BACK-FEELS LIKE HE CAN'T BREATHE   Apnea, sleep    SEVERE OSA PER STUDY 12/01/12   Arthritis    Bariatric surgery status 05/14/2020   Body mass index (BMI) 45.0-49.9, adult ([place]) 05/14/2020   Cancer ([place])    HX OF NON HODGKIN'S LYMPHOMA -IN REMISSION   Cardiomyopathy ([place]) 04/27/2018   Cardiomyopathy, nonischemic ([place])    DX 2003 CARDIAC CATH, ECHO 2006 SHOWED EF IMPROVED TO 45-50%--PER CARDIOLOGY OFFICE NOTES DR. Linard Millers FROM 01/20/10   CHF (congestive heart failure) ([place]) 2003   Chronic kidney disease    HX OF CHRONIC KIDNEY DISEASE STAGE III - EVALUATED AT [facility] IN THE PAST AND FELT TO BE RELATED TO NSAID'S AND CHEMO.  PT  STATES HE HAS NOT SEEN KIDNEY SPECIALIST AT [facility] IN OVER A YEAR.   Chronic rhinitis 28/36/6294   Chronic systolic heart failure ([place])    CARDIOLOGIST IS DR. Linard Millers   CKD (chronic kidney disease), stage III ([place]) 12/08/2012   Claustrophobia    Diabetes ([place]) 04/27/2012   Diabetic renal disease ([place]) 05/14/2020   Diffuse  large cell lymphoma in remission ([place]) 04/27/2012   Gastro-esophageal reflux disease without esophagitis 05/14/2020   GERD (gastroesophageal reflux disease)    NOT TAKING ANY MEDS FOR REFLUX   History of adenomatous polyp of colon 05/14/2020   History of Lap Roux-en-Y gastric bypass 03/05/13 03/23/2013   Hydrocele 05/14/2020   Hyperlipidemia    Hypertension    IDDM (insulin dependent diabetes mellitus) 12/08/2012   Long term (current) use of insulin ([place]) 05/14/2020   Morbid obesity ([place])    Multiple thyroid nodules    HX OF NODULES=PT STATES BIOPSY-TOLD ALL OKAY   Non-Hodgkin lymphoma ([place])    DX ABOUT 2007-TX'D WITH CHEMO-IN REMISSION   Nonischemic cardiomyopathy ([place]) 11/25/2017   Obesity 05/14/2020   Obstructive sleep apnea 12/08/2012   Pure hypercholesterolemia 05/14/2020   Sciatica, right side 05/14/2020   Shortness of breath    WALKING UPSTAIRS   Skin sensation disturbance 05/14/2020   Subclinical hypothyroidism 05/14/2020   Testicular hypofunction 05/14/2020   Thyroid nodule 05/14/2020    Past Surgical History:  Procedure Laterality Date   BIOPSY  09/25/2019  Procedure: BIOPSY;  Surgeon: Otis Brace, MD;  Location: [facility];  Service: Gastroenterology;;   COLONOSCOPY WITH PROPOFOL N/A 01/09/2013   Procedure: COLONOSCOPY WITH PROPOFOL;  Surgeon: Garlan Fair, MD;  [facility];  Service: Endoscopy;  Laterality: N/A;   COLONOSCOPY WITH PROPOFOL N/A 09/25/2019   Procedure: COLONOSCOPY WITH PROPOFOL;  Surgeon: Otis Brace, MD;  [facility];  Service: Gastroenterology;  Laterality: N/A;   GASTRIC ROUX-EN-Y N/A 03/05/2013   Procedure: LAPAROSCOPIC ROUX-EN-Y GASTRIC BYPASS WITH UPPER ENDOSCOPY;  Surgeon: Gayland Curry, MD;  Location[facility];  Service: General;  Laterality: N/A;   HYDROCELE EXCISION  02/09/2011   Procedure: HYDROCELECTOMY ADULT;  Surgeon: Fredricka Bonine, MD;  [facility];  Service: Urology;  Laterality:  Right;   POLYPECTOMY  09/25/2019   Procedure: POLYPECTOMY;  Surgeon: Otis Brace, MD;  [facility];  Service: Gastroenterology;;   WISDOM TOOTH EXTRACTION      MEDICATIONS:  acetaminophen (TYLENOL) 650 MG CR tablet   ALPRAZolam (XANAX) 0.5 MG tablet   aspirin EC 81 MG tablet   calcium carbonate (OS-CAL - DOSED IN MG OF ELEMENTAL CALCIUM) 1250 (500 Ca) MG tablet   carvedilol (COREG) 25 MG tablet   FARXIGA 10 MG TABS tablet   fluticasone (FLONASE) 50 MCG/ACT nasal spray   HUMALOG KWIKPEN 200 UNIT/ML KwikPen   ibuprofen (ADVIL) 400 MG tablet   Menthol, Topical Analgesic, (BIOFREEZE) 4 % GEL   Multiple Vitamin (MULTIVITAMIN WITH MINERALS) TABS tablet   NON FORMULARY   sacubitril-valsartan (ENTRESTO) 49-51 MG   SOLIQUA 100-33 UNT-MCG/ML SOPN   traMADol (ULTRAM) 50 MG tablet   zolpidem (AMBIEN) 10 MG tablet   No current facility-administered medications for this encounter.     Konrad Felix Ward, PA-C [facility] [phone]

## 2021-10-15 NOTE — Anesthesia Preprocedure Evaluation (Addendum)
Anesthesia Evaluation  Patient identified by MRN, date of birth, ID band Patient awake    Reviewed: Allergy & Precautions, NPO status , Patient's Chart, lab work & pertinent test results  Airway Mallampati: II  TM Distance: >3 FB     Dental   Pulmonary shortness of breath, sleep apnea ,    breath sounds clear to auscultation       Cardiovascular hypertension, +CHF   Rhythm:Regular Rate:Normal     Neuro/Psych  Neuromuscular disease    GI/Hepatic Neg liver ROS, GERD  ,  Endo/Other  diabetesHypothyroidism   Renal/GU Renal disease     Musculoskeletal  (+) Arthritis ,   Abdominal   Peds  Hematology   Anesthesia Other Findings   Reproductive/Obstetrics                            Anesthesia Physical Anesthesia Plan  ASA: 3  Anesthesia Plan: General   Post-op Pain Management:    Induction: Intravenous  PONV Risk Score and Plan: 3 and Ondansetron, Dexamethasone and Midazolam  Airway Management Planned: Oral ETT  Additional Equipment:   Intra-op Plan:   Post-operative Plan:   Informed Consent: I have reviewed the patients History and Physical, chart, labs and discussed the procedure including the risks, benefits and alternatives for the proposed anesthesia with the patient or authorized representative who has indicated his/her understanding and acceptance.     Dental advisory given  Plan Discussed with: CRNA and Anesthesiologist  Anesthesia Plan Comments: (See PAT note 10/06/2021)       Anesthesia Quick Evaluation

## 2021-10-16 ENCOUNTER — Ambulatory Visit (HOSPITAL_COMMUNITY): Payer: BC Managed Care – PPO | Admitting: Anesthesiology

## 2021-10-16 ENCOUNTER — Encounter (HOSPITAL_COMMUNITY): Payer: Self-pay | Admitting: Surgery

## 2021-10-16 ENCOUNTER — Ambulatory Visit (HOSPITAL_COMMUNITY): Payer: BC Managed Care – PPO | Admitting: Physician Assistant

## 2021-10-16 ENCOUNTER — Other Ambulatory Visit: Payer: Self-pay

## 2021-10-16 ENCOUNTER — Encounter (HOSPITAL_COMMUNITY)
Admission: RE | Disposition: A | Discharge status: Home / Self Care | Payer: Self-pay | Source: Ambulatory Visit | Attending: Surgery

## 2021-10-16 ENCOUNTER — Ambulatory Visit (HOSPITAL_COMMUNITY)
Admission: RE | Admit: 2021-10-16 | Discharge: 2021-10-17 | Disposition: A | Discharge status: Home / Self Care | Payer: BC Managed Care – PPO | Source: Ambulatory Visit | Attending: Surgery | Admitting: Surgery

## 2021-10-16 DIAGNOSIS — Z794 Long term (current) use of insulin: Secondary | ICD-10-CM | POA: Diagnosis not present

## 2021-10-16 DIAGNOSIS — K769 Liver disease, unspecified: Secondary | ICD-10-CM

## 2021-10-16 DIAGNOSIS — K219 Gastro-esophageal reflux disease without esophagitis: Secondary | ICD-10-CM | POA: Insufficient documentation

## 2021-10-16 DIAGNOSIS — I110 Hypertensive heart disease with heart failure: Secondary | ICD-10-CM | POA: Diagnosis not present

## 2021-10-16 DIAGNOSIS — J398 Other specified diseases of upper respiratory tract: Secondary | ICD-10-CM | POA: Diagnosis not present

## 2021-10-16 DIAGNOSIS — M1990 Unspecified osteoarthritis, unspecified site: Secondary | ICD-10-CM | POA: Insufficient documentation

## 2021-10-16 DIAGNOSIS — E042 Nontoxic multinodular goiter: Secondary | ICD-10-CM | POA: Diagnosis not present

## 2021-10-16 DIAGNOSIS — G4730 Sleep apnea, unspecified: Secondary | ICD-10-CM | POA: Insufficient documentation

## 2021-10-16 DIAGNOSIS — I509 Heart failure, unspecified: Secondary | ICD-10-CM | POA: Diagnosis not present

## 2021-10-16 DIAGNOSIS — E119 Type 2 diabetes mellitus without complications: Secondary | ICD-10-CM | POA: Diagnosis not present

## 2021-10-16 DIAGNOSIS — I10 Essential (primary) hypertension: Secondary | ICD-10-CM

## 2021-10-16 DIAGNOSIS — E049 Nontoxic goiter, unspecified: Secondary | ICD-10-CM | POA: Diagnosis present

## 2021-10-16 HISTORY — PX: THYROID LOBECTOMY: SHX420

## 2021-10-16 HISTORY — DX: Nontoxic multinodular goiter: E04.2

## 2021-10-16 LAB — COMPREHENSIVE METABOLIC PANEL
ALT: 24 U/L (ref 0–44)
AST: 25 U/L (ref 15–41)
Albumin: 4.1 g/dL (ref 3.5–5.0)
Alkaline Phosphatase: 55 U/L (ref 38–126)
Anion gap: 8 (ref 5–15)
BUN: 25 mg/dL — ABNORMAL HIGH (ref 8–23)
CO2: 25 mmol/L (ref 22–32)
Calcium: 9.2 mg/dL (ref 8.9–10.3)
Chloride: 103 mmol/L (ref 98–111)
Creatinine, Ser: 1.95 mg/dL — ABNORMAL HIGH (ref 0.61–1.24)
GFR, Estimated: 38 mL/min — ABNORMAL LOW (ref >60)
Glucose, Bld: 100 mg/dL — ABNORMAL HIGH (ref 70–99)
Potassium: 4.2 mmol/L (ref 3.5–5.1)
Sodium: 136 mmol/L (ref 135–145)
Total Bilirubin: 0.7 mg/dL (ref 0.3–1.2)
Total Protein: 7.6 g/dL (ref 6.5–8.1)

## 2021-10-16 LAB — GLUCOSE, CAPILLARY
Glucose-Capillary: 109 mg/dL — ABNORMAL HIGH (ref 70–99)
Glucose-Capillary: 300 mg/dL — ABNORMAL HIGH (ref 70–99)
Glucose-Capillary: 329 mg/dL — ABNORMAL HIGH (ref 70–99)
Glucose-Capillary: 79 mg/dL (ref 70–99)

## 2021-10-16 LAB — CBC
HCT: 42.5 % (ref 39.0–52.0)
Hemoglobin: 13.4 g/dL (ref 13.0–17.0)
MCH: 27.6 pg (ref 26.0–34.0)
MCHC: 31.5 g/dL (ref 30.0–36.0)
MCV: 87.6 fL (ref 80.0–100.0)
Platelets: 225 10*3/uL (ref 150–400)
RBC: 4.85 MIL/uL (ref 4.22–5.81)
RDW: 14 % (ref 11.5–15.5)
WBC: 4.6 10*3/uL (ref 4.0–10.5)
nRBC: 0 % (ref 0.0–0.2)

## 2021-10-16 SURGERY — LOBECTOMY, THYROID
Anesthesia: General | Site: Neck

## 2021-10-16 MED ORDER — SUGAMMADEX SODIUM 500 MG/5ML IV SOLN
INTRAVENOUS | Status: DC | PRN
  Administered 2021-10-16: 500 mg via INTRAVENOUS

## 2021-10-16 MED ORDER — PHENYLEPHRINE HCL (PRESSORS) 10 MG/ML IV SOLN
INTRAVENOUS | Status: DC | PRN
  Administered 2021-10-16: 160 ug via INTRAVENOUS

## 2021-10-16 MED ORDER — SACUBITRIL-VALSARTAN 49-51 MG PO TABS
1.0000 | ORAL_TABLET | Freq: Two times a day (BID) | ORAL | Status: DC
  Administered 2021-10-16 – 2021-10-17 (×2): 1 via ORAL
  Filled 2021-10-16 (×2): qty 1

## 2021-10-16 MED ORDER — LIDOCAINE HCL (PF) 2 % IJ SOLN
INTRAMUSCULAR | Status: AC
  Filled 2021-10-16: qty 5

## 2021-10-16 MED ORDER — CHLORHEXIDINE GLUCONATE 0.12 % MT SOLN
15.0000 mL | Freq: Once | OROMUCOSAL | Status: AC
  Administered 2021-10-16: 15 mL via OROMUCOSAL

## 2021-10-16 MED ORDER — BSS IO SOLN
15.0000 mL | Freq: Once | INTRAOCULAR | Status: AC
  Administered 2021-10-16: 15 mL
  Filled 2021-10-16: qty 15

## 2021-10-16 MED ORDER — ONDANSETRON HCL 4 MG/2ML IJ SOLN: 4.0000 mg | Freq: Four times a day (QID) | INTRAMUSCULAR | Status: DC | PRN

## 2021-10-16 MED ORDER — OXYCODONE HCL 5 MG PO TABS
5.0000 mg | ORAL_TABLET | ORAL | Status: DC | PRN
  Administered 2021-10-16: 5 mg via ORAL
  Administered 2021-10-17 (×3): 10 mg via ORAL
  Filled 2021-10-16 (×2): qty 2
  Filled 2021-10-16: qty 1

## 2021-10-16 MED ORDER — INSULIN ASPART 100 UNIT/ML IJ SOLN: 0.0000 [IU] | INTRAMUSCULAR | Status: DC

## 2021-10-16 MED ORDER — ROCURONIUM BROMIDE 100 MG/10ML IV SOLN
INTRAVENOUS | Status: DC | PRN
  Administered 2021-10-16: 100 mg via INTRAVENOUS

## 2021-10-16 MED ORDER — DEXAMETHASONE SODIUM PHOSPHATE 10 MG/ML IJ SOLN
INTRAMUSCULAR | Status: AC
  Filled 2021-10-16: qty 1

## 2021-10-16 MED ORDER — HEMOSTATIC AGENTS (NO CHARGE) OPTIME
TOPICAL | Status: DC | PRN
  Administered 2021-10-16: 1

## 2021-10-16 MED ORDER — INSULIN ASPART 100 UNIT/ML IJ SOLN
0.0000 [IU] | INTRAMUSCULAR | Status: DC
  Administered 2021-10-16: 11 [IU] via SUBCUTANEOUS
  Administered 2021-10-16: 15 [IU] via SUBCUTANEOUS
  Administered 2021-10-17: 3 [IU] via SUBCUTANEOUS
  Administered 2021-10-17: 4 [IU] via SUBCUTANEOUS

## 2021-10-16 MED ORDER — ACETAMINOPHEN 325 MG PO TABS: 650.0000 mg | ORAL_TABLET | Freq: Four times a day (QID) | ORAL | Status: DC | PRN

## 2021-10-16 MED ORDER — ORAL CARE MOUTH RINSE: 15.0000 mL | Freq: Once | OROMUCOSAL | Status: AC

## 2021-10-16 MED ORDER — SUCCINYLCHOLINE CHLORIDE 200 MG/10ML IV SOSY
PREFILLED_SYRINGE | INTRAVENOUS | Status: DC | PRN
  Administered 2021-10-16: 140 mg via INTRAVENOUS

## 2021-10-16 MED ORDER — CARVEDILOL 25 MG PO TABS
25.0000 mg | ORAL_TABLET | Freq: Two times a day (BID) | ORAL | Status: DC
  Administered 2021-10-16 – 2021-10-17 (×2): 25 mg via ORAL
  Filled 2021-10-16 (×2): qty 1

## 2021-10-16 MED ORDER — HYDROMORPHONE HCL 1 MG/ML IJ SOLN
0.2500 mg | INTRAMUSCULAR | Status: DC | PRN
  Administered 2021-10-16 (×4): 0.5 mg via INTRAVENOUS

## 2021-10-16 MED ORDER — ACETAMINOPHEN 10 MG/ML IV SOLN
1000.0000 mg | Freq: Once | INTRAVENOUS | Status: DC | PRN
  Administered 2021-10-16: 1000 mg via INTRAVENOUS

## 2021-10-16 MED ORDER — ONDANSETRON HCL 4 MG/2ML IJ SOLN
INTRAMUSCULAR | Status: DC | PRN
  Administered 2021-10-16: 4 mg via INTRAVENOUS

## 2021-10-16 MED ORDER — INSULIN ASPART 100 UNIT/ML IJ SOLN: 0.0000 [IU] | Freq: Three times a day (TID) | INTRAMUSCULAR | Status: DC

## 2021-10-16 MED ORDER — CEFAZOLIN IN SODIUM CHLORIDE 3-0.9 GM/100ML-% IV SOLN
3.0000 g | INTRAVENOUS | Status: AC
  Administered 2021-10-16: 3 g via INTRAVENOUS
  Filled 2021-10-16: qty 100

## 2021-10-16 MED ORDER — PHENYLEPHRINE HCL-NACL 20-0.9 MG/250ML-% IV SOLN
INTRAVENOUS | Status: AC
  Filled 2021-10-16: qty 250

## 2021-10-16 MED ORDER — FENTANYL CITRATE PF 50 MCG/ML IJ SOSY
PREFILLED_SYRINGE | INTRAMUSCULAR | Status: AC
  Filled 2021-10-16: qty 2

## 2021-10-16 MED ORDER — MIDAZOLAM HCL 2 MG/2ML IJ SOLN
INTRAMUSCULAR | Status: AC
  Filled 2021-10-16: qty 2

## 2021-10-16 MED ORDER — FENTANYL CITRATE PF 50 MCG/ML IJ SOSY
25.0000 ug | PREFILLED_SYRINGE | INTRAMUSCULAR | Status: DC | PRN
  Administered 2021-10-16 (×2): 50 ug via INTRAVENOUS

## 2021-10-16 MED ORDER — SUCCINYLCHOLINE CHLORIDE 200 MG/10ML IV SOSY
PREFILLED_SYRINGE | INTRAVENOUS | Status: AC
  Filled 2021-10-16: qty 10

## 2021-10-16 MED ORDER — KETOROLAC TROMETHAMINE 0.5 % OP SOLN
1.0000 [drp] | Freq: Four times a day (QID) | OPHTHALMIC | Status: AC
  Administered 2021-10-16 – 2021-10-17 (×4): 1 [drp] via OPHTHALMIC
  Filled 2021-10-16: qty 5

## 2021-10-16 MED ORDER — TRAMADOL HCL 50 MG PO TABS
50.0000 mg | ORAL_TABLET | Freq: Four times a day (QID) | ORAL | Status: DC | PRN
  Administered 2021-10-16: 50 mg via ORAL
  Filled 2021-10-16: qty 1

## 2021-10-16 MED ORDER — FENTANYL CITRATE (PF) 250 MCG/5ML IJ SOLN
INTRAMUSCULAR | Status: AC
  Filled 2021-10-16: qty 5

## 2021-10-16 MED ORDER — PROPOFOL 10 MG/ML IV BOLUS
INTRAVENOUS | Status: DC | PRN
  Administered 2021-10-16: 200 mg via INTRAVENOUS

## 2021-10-16 MED ORDER — PHENYLEPHRINE 80 MCG/ML (10ML) SYRINGE FOR IV PUSH (FOR BLOOD PRESSURE SUPPORT)
PREFILLED_SYRINGE | INTRAVENOUS | Status: AC
  Filled 2021-10-16: qty 10

## 2021-10-16 MED ORDER — DEXAMETHASONE SODIUM PHOSPHATE 10 MG/ML IJ SOLN
INTRAMUSCULAR | Status: DC | PRN
  Administered 2021-10-16: 10 mg via INTRAVENOUS

## 2021-10-16 MED ORDER — ROCURONIUM BROMIDE 10 MG/ML (PF) SYRINGE
PREFILLED_SYRINGE | INTRAVENOUS | Status: AC
  Filled 2021-10-16: qty 10

## 2021-10-16 MED ORDER — ZOLPIDEM TARTRATE 5 MG PO TABS: 10.0000 mg | ORAL_TABLET | Freq: Every evening | ORAL | Status: DC | PRN

## 2021-10-16 MED ORDER — ONDANSETRON HCL 4 MG/2ML IJ SOLN
INTRAMUSCULAR | Status: AC
  Filled 2021-10-16: qty 2

## 2021-10-16 MED ORDER — ACETAMINOPHEN 650 MG RE SUPP: 650.0000 mg | Freq: Four times a day (QID) | RECTAL | Status: DC | PRN

## 2021-10-16 MED ORDER — ROCURONIUM BROMIDE 100 MG/10ML IV SOLN: INTRAVENOUS | Status: DC | PRN

## 2021-10-16 MED ORDER — SODIUM CHLORIDE 0.45 % IV SOLN: INTRAVENOUS | Status: DC

## 2021-10-16 MED ORDER — HYDROMORPHONE HCL 1 MG/ML IJ SOLN: 1.0000 mg | INTRAMUSCULAR | Status: DC | PRN

## 2021-10-16 MED ORDER — ONDANSETRON 4 MG PO TBDP: 4.0000 mg | ORAL_TABLET | Freq: Four times a day (QID) | ORAL | Status: DC | PRN

## 2021-10-16 MED ORDER — CHLORHEXIDINE GLUCONATE CLOTH 2 % EX PADS: 6.0000 | MEDICATED_PAD | Freq: Once | CUTANEOUS | Status: DC

## 2021-10-16 MED ORDER — FENTANYL CITRATE (PF) 100 MCG/2ML IJ SOLN
INTRAMUSCULAR | Status: DC | PRN
  Administered 2021-10-16: 50 ug via INTRAVENOUS
  Administered 2021-10-16 (×3): 100 ug via INTRAVENOUS

## 2021-10-16 MED ORDER — ACETAMINOPHEN 10 MG/ML IV SOLN
INTRAVENOUS | Status: AC
  Filled 2021-10-16: qty 100

## 2021-10-16 MED ORDER — SUGAMMADEX SODIUM 500 MG/5ML IV SOLN
INTRAVENOUS | Status: AC
  Filled 2021-10-16: qty 5

## 2021-10-16 MED ORDER — DAPAGLIFLOZIN PROPANEDIOL 10 MG PO TABS
10.0000 mg | ORAL_TABLET | Freq: Every day | ORAL | Status: DC
  Administered 2021-10-17: 10 mg via ORAL
  Filled 2021-10-16: qty 1

## 2021-10-16 MED ORDER — LACTATED RINGERS IV SOLN
INTRAVENOUS | Status: DC
  Administered 2021-10-16: 1000 mL via INTRAVENOUS

## 2021-10-16 MED ORDER — PHENYLEPHRINE HCL-NACL 20-0.9 MG/250ML-% IV SOLN
INTRAVENOUS | Status: DC | PRN
  Administered 2021-10-16: 50 ug/min via INTRAVENOUS

## 2021-10-16 MED ORDER — LIDOCAINE HCL (CARDIAC) PF 100 MG/5ML IV SOSY
PREFILLED_SYRINGE | INTRAVENOUS | Status: DC | PRN
  Administered 2021-10-16: 100 mg via INTRAVENOUS

## 2021-10-16 MED ORDER — MIDAZOLAM HCL 5 MG/5ML IJ SOLN
INTRAMUSCULAR | Status: DC | PRN
  Administered 2021-10-16: 2 mg via INTRAVENOUS

## 2021-10-16 MED ORDER — 0.9 % SODIUM CHLORIDE (POUR BTL) OPTIME
TOPICAL | Status: DC | PRN
  Administered 2021-10-16: 1000 mL

## 2021-10-16 MED ORDER — HYDROMORPHONE HCL 1 MG/ML IJ SOLN
INTRAMUSCULAR | Status: AC
  Filled 2021-10-16: qty 2

## 2021-10-16 SURGICAL SUPPLY — 32 items
ATTRACTOMAT 16X20 MAGNETIC DRP (DRAPES) ×2 IMPLANT
BAG COUNTER SPONGE SURGICOUNT (BAG) ×2 IMPLANT
BLADE SURG 15 STRL LF DISP TIS (BLADE) ×1 IMPLANT
BLADE SURG 15 STRL SS (BLADE) ×2
CHLORAPREP W/TINT 26 (MISCELLANEOUS) ×2 IMPLANT
CLIP TI MEDIUM 6 (CLIP) ×8 IMPLANT
CLIP TI WIDE RED SMALL 6 (CLIP) ×4 IMPLANT
COVER SURGICAL LIGHT HANDLE (MISCELLANEOUS) ×2 IMPLANT
DERMABOND ADVANCED (GAUZE/BANDAGES/DRESSINGS) ×1
DERMABOND ADVANCED .7 DNX12 (GAUZE/BANDAGES/DRESSINGS) ×1 IMPLANT
DRAPE LAPAROTOMY T 98X78 PEDS (DRAPES) ×2 IMPLANT
DRAPE UTILITY XL STRL (DRAPES) ×2 IMPLANT
ELECT PENCIL ROCKER SW 15FT (MISCELLANEOUS) ×2 IMPLANT
ELECT REM PT RETURN 15FT ADLT (MISCELLANEOUS) ×2 IMPLANT
EVACUATOR DRAINAGE 7X20 100CC (MISCELLANEOUS) IMPLANT
EVACUATOR SILICONE 100CC (MISCELLANEOUS) ×2
GAUZE 4X4 16PLY ~~LOC~~+RFID DBL (SPONGE) ×3 IMPLANT
GLOVE SURG ORTHO 8.0 STRL STRW (GLOVE) ×2 IMPLANT
GOWN STRL REUS W/ TWL XL LVL3 (GOWN DISPOSABLE) ×2 IMPLANT
GOWN STRL REUS W/TWL XL LVL3 (GOWN DISPOSABLE) ×4
HEMOSTAT SURGICEL 2X4 FIBR (HEMOSTASIS) ×2 IMPLANT
ILLUMINATOR WAVEGUIDE N/F (MISCELLANEOUS) ×2 IMPLANT
KIT BASIN OR (CUSTOM PROCEDURE TRAY) ×2 IMPLANT
KIT TURNOVER KIT A (KITS) IMPLANT
PACK BASIC VI WITH GOWN DISP (CUSTOM PROCEDURE TRAY) ×2 IMPLANT
SHEARS HARMONIC 9CM CVD (BLADE) ×2 IMPLANT
SUT MNCRL AB 4-0 PS2 18 (SUTURE) ×2 IMPLANT
SUT VIC AB 3-0 SH 18 (SUTURE) ×4 IMPLANT
SYR BULB IRRIG 60ML STRL (SYRINGE) ×2 IMPLANT
TOWEL OR 17X26 10 PK STRL BLUE (TOWEL DISPOSABLE) ×2 IMPLANT
TOWEL OR NON WOVEN STRL DISP B (DISPOSABLE) ×2 IMPLANT
TUBING CONNECTING 10 (TUBING) ×2 IMPLANT

## 2021-10-16 NOTE — Transfer of Care (Signed)
Immediate Anesthesia Transfer of Care Note  Patient: [name] Sosa  Procedure(s) Performed: LEFT THYROID LOBECTOMY (Neck)  Patient Location: PACU  Anesthesia Type:General  Level of Consciousness: awake, alert , oriented and patient cooperative  Airway & Oxygen Therapy: Patient Spontanous Breathing and Patient connected to face mask oxygen  Post-op Assessment: Report given to RN, Post -op Vital signs reviewed and stable and Patient moving all extremities X 4  Post vital signs: Reviewed and stable  Last Vitals:  Vitals Value Taken Time  BP 149/78 10/16/21 1346  Temp 37 C 10/16/21 1346  Pulse 73 10/16/21 1350  Resp    SpO2 100 % 10/16/21 1350  Vitals shown include unvalidated device data.  Last Pain:  Vitals:   10/16/21 0952  PainSc: 5       Patients Stated Pain Goal: 4 (07/37/10 6269)  Complications:  Encounter Notable Events  Notable Event Outcome Phase Comment  Difficult to intubate - expected  Intraprocedure Filed from anesthesia note documentation.

## 2021-10-16 NOTE — Anesthesia Procedure Notes (Signed)
Procedure Name: Intubation Date/Time: 10/16/2021 11:32 AM  Performed by: Jonna Munro, CRNAPre-anesthesia Checklist: Patient identified, Emergency Drugs available, Suction available, Patient being monitored and Timeout performed Patient Re-evaluated:Patient Re-evaluated prior to induction Oxygen Delivery Method: Circle system utilized Preoxygenation: Pre-oxygenation with 100% oxygen Induction Type: IV induction, Rapid sequence and Cricoid Pressure applied Laryngoscope Size: Glidescope and 4 Grade View: Grade I Tube type: Oral Tube size: 7.5 mm Number of attempts: 1 Airway Equipment and Method: Stylet and Video-laryngoscopy Placement Confirmation: ETT inserted through vocal cords under direct vision, positive ETCO2, CO2 detector and breath sounds checked- equal and bilateral Secured at: 23 cm Tube secured with: Tape Dental Injury: Teeth and Oropharynx as per pre-operative assessment  Difficulty Due To: Difficulty was anticipated Comments: Deviated trachea, easy atraumatic intubation with glidescope lopro 4

## 2021-10-16 NOTE — Interval H&P Note (Signed)
History and Physical Interval Note:  10/16/2021 10:38 AM  [name] Sosa  has presented today for surgery, with the diagnosis of SUBSTERNAL THYROID GOITER WITH TRACHEAL DEVIATION.  The various methods of treatment have been discussed with the patient and family. After consideration of risks, benefits and other options for treatment, the patient has consented to    Procedure(s): LEFT THYROID LOBECTOMY (Left) as a surgical intervention.    The patient's history has been reviewed, patient examined, no change in status, stable for surgery.  I have reviewed the patient's chart and labs.  Questions were answered to the patient's satisfaction.    Armandina Gemma, [facility] [place] [facility] Office: [place]

## 2021-10-16 NOTE — Op Note (Signed)
Operative Note  Pre-operative Diagnosis:  multinodular thyroid goiter with tracheal deviation  Post-operative Diagnosis:  same  Surgeon:  Armandina Gemma, MD  Assistant:  none   Procedure:  left thyroid lobectomy  Anesthesia:  general  Estimated Blood Loss:  250 cc  Drains: 7 mm flat JP drain         Specimen: left thyroid lobe to pathology  Indications:  Patient is referred by Dr. Lavone Orn for surgical evaluation and management of a large asymmetric thyroid goiter with substernal component resulting in significant tracheal deviation. Patient is also followed by Dr. Braulio Bosch from orthopedic surgery. Patient had been scheduled for hip surgery. Concerns were raised by anesthesiology due to abnormal chest x-ray and subsequent abnormal CT scan of the chest demonstrating a large thyroid goiter with a left substernal component resulting in significant tracheal deviation. Patient has a history of thyroid disease dating back approximately 25 years when [name] Sosa was evaluated by endocrinology. At that time [name] Sosa states [name] Sosa underwent needle biopsy and was told the results were benign. [name] Sosa has never been on thyroid medication. [name] Sosa denies any significant dyspnea or dysphagia. Recent chest CT demonstrated a large substernal goiter on the left with tracheal deviation to the right of approximately 3 cm. Patient underwent an ultrasound examination on Aug 13, 2021. This showed an enlarged right thyroid lobe measuring 5.3 x 2.8 x 2.3 cm and a markedly enlarged left thyroid lobe measuring 9.2 x 4.6 x 6.4 cm. There were dominant nodules bilaterally which were biopsied preoperatively with benign cytopathology.  Patient now comes to surgery for left thyroid lobectomy for relief of compressive symptoms and tracheal deviation.  Procedure:  The patient was seen in the pre-op holding area. The risks, benefits, complications, treatment options, and expected outcomes were previously discussed with the patient. The patient  agreed with the proposed plan and has signed the informed consent form.  The patient was brought to the operating room by the surgical team, identified as [name] Sosa and the procedure verified. A "time out" was completed and the above information confirmed.  Following administration of general endotracheal anesthesia, the patient was positioned and then prepped and draped in the usual aseptic fashion.  After ascertaining that an adequate level of anesthesia been achieved, a Kocher incision is performed with a #15 blade.  Dissection is carried through subcutaneous tissues and platysma.  Large external jugular veins were suture ligated with 3-0 Vicryl suture ligatures.  Platysma was divided with the electrocautery.  Skin flaps were then elevated cephalad and caudad from the thyroid notch to the sternal notch.  Horner self-retaining retractors placed for exposure.  Strap muscles are incised in the midline.  Palpation of the right thyroid lobe shows it to be relatively normal in size with a dominant approximately 3 cm nodule which was previously biopsied.  Strap muscles are reflected to the left exposing a markedly enlarged left thyroid lobe which extends well into the anterior mediastinum.  Lobe is gently mobilized.  Venous tributaries are divided between medium ligaclips with the harmonic scalpel.  Using gentle blunt dissection the gland is mobilized from the surrounding structures.  Superior pole vessels are dissected out and divided between medium ligaclips with the harmonic scalpel.  Inferior pole was dissected gently out of the anterior mediastinum with blunt dissection.  It is delivered up and into the incision in the neck.  Gland is rolled anteriorly.  Branches of the inferior thyroid artery are divided between ligaclips with the harmonic scalpel.  Parathyroid  tissue was identified and preserved.  Gland is rolled further anteriorly.  Care is taken to preserve posterior tissues and hopefully to  preserve the recurrent laryngeal nerve along its length.  Dissection is carried down to the ligament of Gwenlyn Found which is divided with the electrocautery and the gland is mobilized up and onto the anterior trachea.  Isthmus is mobilized across the midline.  Thyroid parenchyma is transected at the junction of the isthmus and the right thyroid lobe using the harmonic scalpel.  The entire left lobe is excised and submitted to pathology for review.  Dry packs are initially placed in the left neck.  Packs are then removed and hemostasis achieved with the electrocautery and use of small and medium ligaclips.  Left neck is copiously irrigated with warm saline.  Hemostasis is achieved.  Fibrillar is placed throughout the operative field.  A 7 mm flat Jackson-Pratt drain is brought in through a stab wound in the left lateral neck.  It is brought through the strap muscles and into the left thyroid bed.  It is properly positioned and then secured to the skin with a 3-0 nylon suture.  Strap muscles are closed in the midline of interrupted 3-0 Vicryl sutures.  Platysma was closed with interrupted 3-0 Vicryl sutures.  Skin is closed with a running 4-0 Monocryl subcuticular suture.  Wound is washed and dried and Dermabond is applied as dressing.  Drain is placed to bulb suction.  Patient is awakened from anesthesia and transported to the recovery room in stable condition.  The patient tolerated the procedure well.   Armandina Gemma, [facility] Office: [phone]

## 2021-10-17 DIAGNOSIS — E042 Nontoxic multinodular goiter: Secondary | ICD-10-CM | POA: Diagnosis not present

## 2021-10-17 DIAGNOSIS — Z794 Long term (current) use of insulin: Secondary | ICD-10-CM | POA: Diagnosis not present

## 2021-10-17 DIAGNOSIS — I110 Hypertensive heart disease with heart failure: Secondary | ICD-10-CM | POA: Diagnosis not present

## 2021-10-17 DIAGNOSIS — I509 Heart failure, unspecified: Secondary | ICD-10-CM | POA: Diagnosis not present

## 2021-10-17 DIAGNOSIS — M1990 Unspecified osteoarthritis, unspecified site: Secondary | ICD-10-CM | POA: Diagnosis not present

## 2021-10-17 DIAGNOSIS — G4730 Sleep apnea, unspecified: Secondary | ICD-10-CM | POA: Diagnosis not present

## 2021-10-17 DIAGNOSIS — K219 Gastro-esophageal reflux disease without esophagitis: Secondary | ICD-10-CM | POA: Diagnosis not present

## 2021-10-17 DIAGNOSIS — J398 Other specified diseases of upper respiratory tract: Secondary | ICD-10-CM | POA: Diagnosis not present

## 2021-10-17 DIAGNOSIS — E119 Type 2 diabetes mellitus without complications: Secondary | ICD-10-CM | POA: Diagnosis not present

## 2021-10-17 LAB — GLUCOSE, CAPILLARY
Glucose-Capillary: 151 mg/dL — ABNORMAL HIGH (ref 70–99)
Glucose-Capillary: 124 mg/dL — ABNORMAL HIGH (ref 70–99)

## 2021-10-17 MED ORDER — OXYCODONE HCL 5 MG PO TABS: 5.0000 mg | ORAL_TABLET | Freq: Four times a day (QID) | ORAL | 0 refills | Status: DC | PRN

## 2021-10-17 NOTE — Discharge Summary (Signed)
Physician Discharge Summary   Patient ID: [name] Sosa MRN: [id number] DOB/AGE: 05-28-1960 61 y.o.  Admit date: 10/16/2021  Discharge date: 10/17/2021  Discharge Diagnoses:  Principal Problem:   Substernal thyroid goiter Active Problems:   Tracheal deviation   Multinodular goiter (nontoxic)   Discharged Condition: good  Hospital Course: Patient was admitted for observation following thyroid surgery.  Post op course was uncomplicated.  Pain was well controlled.  Tolerated diet.  Minimal output from drain post op - removed prior to discharge.  Patient was prepared for discharge home on POD#1.  Consults: None  Treatments: surgery: right thyroid lobectomy  Discharge Exam: Blood pressure 139/69, pulse 72, temperature 97.6 F (36.4 C), resp. rate 18, height '6\' 1"'$  (1.854 m), weight (!) 154.2 kg, SpO2 100 %. HEENT - clear Neck - wound dry and intact with Dermabond; mild STS; drain with thin serosanguinous; voice normal  Disposition: Home  Discharge Instructions     Diet - low sodium heart healthy   Complete by: As directed    Increase activity slowly   Complete by: As directed    No dressing needed   Complete by: As directed       Allergies as of 10/17/2021       Reactions   Aciphex [rabeprazole Sodium]    Doesn't work    Acyclovir And Related    unkn   Nsaids    AVOIDS NSAID'S DUE TO HX OF CHRONIC KIDNEY DISEASE        Medication List     TAKE these medications    acetaminophen 650 MG CR tablet Commonly known as: TYLENOL Take 1,300 mg by mouth every 8 (eight) hours.   ALPRAZolam 0.5 MG tablet Commonly known as: XANAX Take 0.5 mg by mouth 2 (two) times daily as needed for anxiety.   aspirin EC 81 MG tablet Take 1 tablet (81 mg total) by mouth daily.   Biofreeze 4 % Gel Generic drug: Menthol (Topical Analgesic) Apply 1 application. topically daily as needed (pain).   calcium carbonate 1250 (500 Ca) MG tablet Commonly known as: OS-CAL - dosed  in mg of elemental calcium Take 2 tablets by mouth 2 (two) times daily with a meal.   carvedilol 25 MG tablet Commonly known as: COREG Take 1 tablet (25 mg total) by mouth 2 (two) times daily.   Entresto 49-51 MG Generic drug: sacubitril-valsartan Take 1 tablet by mouth 2 (two) times daily.   Farxiga 10 MG Tabs tablet Generic drug: dapagliflozin propanediol Take 10 mg by mouth daily.   fluticasone 50 MCG/ACT nasal spray Commonly known as: FLONASE Place 1 spray into both nostrils daily as needed for allergies or rhinitis.   HumaLOG KwikPen 200 UNIT/ML KwikPen Generic drug: insulin lispro Inject 20 Units into the skin 2 (two) times daily with a meal.   ibuprofen 400 MG tablet Commonly known as: ADVIL Take 400 mg by mouth every 6 (six) hours as needed for moderate pain.   multivitamin with minerals Tabs tablet Take 1 tablet by mouth daily.   NON FORMULARY Pt uses a cpap   oxyCODONE 5 MG immediate release tablet Commonly known as: Oxy IR/ROXICODONE Take 1 tablet (5 mg total) by mouth every 6 (six) hours as needed for moderate pain.   Soliqua 100-33 UNT-MCG/ML Sopn Generic drug: Insulin Glargine-Lixisenatide Inject 30 Units into the muscle every evening.   traMADol 50 MG tablet Commonly known as: ULTRAM Take 100 mg by mouth every 12 (twelve) hours.   zolpidem  10 MG tablet Commonly known as: AMBIEN Take 10 mg by mouth at bedtime as needed for sleep.               Discharge Care Instructions  (From admission, onward)           Start     Ordered   10/17/21 0000  No dressing needed        10/17/21 6415            Follow-up Information     Armandina Gemma, MD. Schedule an appointment as soon as possible for a visit in 3 week(s).   Specialty: General Surgery Why: For wound re-check Contact information: [place] [address] [phone]                 Shamond Skelton, [facility] Office:  [phone]   Signed: Armandina Gemma 10/17/2021, 9:11 AM

## 2021-10-17 NOTE — Discharge Instructions (Signed)
[facility] - Dr. Tresa Jolley  THYROID & PARATHYROID SURGERY:  POST-OP INSTRUCTIONS  Always review the instruction sheet provided by the hospital nurse at discharge.  A prescription for pain medication may be sent to your pharmacy at the time of discharge.  Take your pain medication as prescribed.  If narcotic pain medicine is not needed, then you may take acetaminophen (Tylenol) or ibuprofen (Advil) as needed for pain or soreness.  Take your normal home medications as prescribed unless otherwise directed.  If you need a refill on your pain medication, please contact the office during regular business hours.  Prescriptions will not be processed by the office after 5:00PM or on weekends.  Start with a light diet upon arrival home, such as soup and crackers or toast.  Be sure to drink plenty of fluids.  Resume your normal diet the day after surgery.  Most patients will experience some swelling and bruising on the chest and neck area.  Ice packs will help for the first 48 hours after arriving home.  Swelling and bruising will take several days to resolve.   It is common to experience some constipation after surgery.  Increasing fluid intake and taking a stool softener (Colace) will usually help to prevent this problem.  A mild laxative (Milk of Magnesia or Miralax) should be taken according to package directions if there has been no bowel movement after 48 hours.  Dermabond glue covers your incision. This seals the wound and you may shower at any time. The Dermabond will remain in place for about a week.  You may gradually remove the glue when it loosens around the edges.  If you need to loosen the Dermabond for removal, apply a layer of Vaseline to the wound for 15 minutes and then remove with a Kleenex. Your sutures are under the skin and will not show - they will dissolve on their own.  You may resume light daily activities beginning the day after discharge (such as self-care,  walking, climbing stairs), gradually increasing activities as tolerated. You may have sexual intercourse when it is comfortable. Refrain from any heavy lifting or straining until approved by your doctor. You may drive when you no longer are taking prescription pain medication, you can comfortably wear a seatbelt, and you can safely maneuver your car and apply the brakes.  You will see your doctor in the office for a follow-up appointment approximately three weeks after your surgery.  Make sure that you call for this appointment within a day or two after you arrive home to insure a convenient appointment time. Please have any requested laboratory tests performed a few days prior to your office visit so that the results will be available at your follow up appointment.  WHEN TO CALL THE [facility] OFFICE: -- Fever greater than 101.5 -- Inability to urinate -- Nausea and/or vomiting - persistent -- Extreme swelling or bruising -- Continued bleeding from incision -- Increased pain, redness, or drainage from the incision -- Difficulty swallowing or breathing -- Muscle cramping or spasms -- Numbness or tingling in hands or around lips  The clinic staff is available to answer your questions during regular business hours.  Please don't hesitate to call and ask to speak to one of the nurses if you have concerns.  [facility] OFFICE: [phone] (24 hours)  Please sign up for MyChart accounts. This will allow you to communicate directly with my nurse or myself without having to call the office. It will also allow you   to view your test results. You will need to enroll in MyChart for my office ([facility]) and for the hospital ([place]).  Jalia Zuniga, MD [facility] A [facility] practice 

## 2021-10-19 ENCOUNTER — Encounter (HOSPITAL_COMMUNITY): Payer: Self-pay | Admitting: Surgery

## 2021-10-19 LAB — SURGICAL PATHOLOGY

## 2021-10-19 NOTE — Anesthesia Postprocedure Evaluation (Signed)
Anesthesia Post Note  Patient: SEVAG SHEARN  Procedure(s) Performed: LEFT THYROID LOBECTOMY (Neck)     Patient location during evaluation: PACU Anesthesia Type: General Level of consciousness: awake and alert Pain management: pain level controlled Vital Signs Assessment: post-procedure vital signs reviewed and stable Respiratory status: spontaneous breathing, nonlabored ventilation, respiratory function stable and patient connected to nasal cannula oxygen Cardiovascular status: blood pressure returned to baseline and stable Postop Assessment: no apparent nausea or vomiting Anesthetic complications: yes   Encounter Notable Events  Notable Event Outcome Phase Comment  Difficult to intubate - expected  Intraprocedure Filed from anesthesia note documentation.    Last Vitals:  Vitals:   10/17/21 0446 10/17/21 0743  BP: 123/69 139/69  Pulse: 80 72  Resp: 18   Temp: 36.4 C   SpO2: 98% 100%    Last Pain:  Vitals:   10/17/21 1016  TempSrc:   PainSc: 4                  Auri Jahnke P Jameison Haji

## 2021-11-06 DIAGNOSIS — Z9009 Acquired absence of other part of head and neck: Secondary | ICD-10-CM | POA: Insufficient documentation

## 2021-11-06 HISTORY — DX: Acquired absence of other part of head and neck: Z90.09

## 2021-11-07 DIAGNOSIS — G4733 Obstructive sleep apnea (adult) (pediatric): Secondary | ICD-10-CM | POA: Diagnosis not present

## 2021-11-24 DIAGNOSIS — E049 Nontoxic goiter, unspecified: Secondary | ICD-10-CM | POA: Diagnosis not present

## 2021-11-24 DIAGNOSIS — J398 Other specified diseases of upper respiratory tract: Secondary | ICD-10-CM | POA: Diagnosis not present

## 2021-11-24 DIAGNOSIS — Z9009 Acquired absence of other part of head and neck: Secondary | ICD-10-CM | POA: Diagnosis not present

## 2021-11-25 ENCOUNTER — Other Ambulatory Visit: Payer: Self-pay | Admitting: Orthopaedic Surgery

## 2021-12-14 DIAGNOSIS — G4733 Obstructive sleep apnea (adult) (pediatric): Secondary | ICD-10-CM | POA: Diagnosis not present

## 2021-12-17 DIAGNOSIS — M67911 Unspecified disorder of synovium and tendon, right shoulder: Secondary | ICD-10-CM | POA: Diagnosis not present

## 2021-12-17 DIAGNOSIS — M67912 Unspecified disorder of synovium and tendon, left shoulder: Secondary | ICD-10-CM | POA: Diagnosis not present

## 2021-12-23 NOTE — Patient Instructions (Signed)
DUE TO COVID-19 ONLY TWO VISITORS  (aged 64 and older)  ARE ALLOWED TO COME WITH YOU AND STAY IN THE WAITING ROOM ONLY DURING PRE OP AND PROCEDURE.   **NO VISITORS ARE ALLOWED IN THE SHORT STAY AREA OR RECOVERY ROOM!!**  IF YOU WILL BE ADMITTED INTO THE HOSPITAL YOU ARE ALLOWED ONLY FOUR SUPPORT PEOPLE DURING VISITATION HOURS ONLY (7 AM -8PM)   The support person(s) must pass our screening, gel in and out, and wear a mask at all times, including in the patient's room. Patients must also wear a mask when staff or their support person are in the room. Visitors GUEST BADGE MUST BE WORN VISIBLY  One adult visitor may remain with you overnight and MUST be in the room by 8 P.M.     Your procedure is scheduled on: 12/29/21   Report to [facility] Main Entrance    Report to admitting at  12:30 PM   Call this number if you have problems the morning of surgery [phone]   Do not eat food :After Midnight.   After Midnight you may have the following liquids until _12:00 PM___DAY OF SURGERY  Water Black Coffee (sugar ok, NO MILK/CREAM OR CREAMERS)  Tea (sugar ok, NO MILK/CREAM OR CREAMERS) regular and decaf                             Plain Jell-O (NO RED)                                           Fruit ices (not with fruit pulp, NO RED)                                     Popsicles (NO RED)                                                                  Juice: apple, WHITE grape, WHITE cranberry Sports drinks like Gatorade (NO RED)                   The day of surgery:  Drink ONE  G2 at  11:45 AM the morning of surgery. Drink in one sitting. Do not sip.  This drink was given to you during your hospital  pre-op appointment visit. Nothing else to drink after completing the  G2. At 12:00 PM          If you have questions, please contact your surgeon's office.       Oral Hygiene is also important to reduce your risk of infection.                                    Remember  - BRUSH YOUR TEETH THE MORNING OF SURGERY WITH YOUR REGULAR TOOTHPASTE    Take these medicines the morning of surgery with A SIP OF WATER: Xanax if needed  Carvedilol- Coreg                                                                                                                             Entresto  How to Manage Your Diabetes Before and After Surgery  Why is it important to control my blood sugar before and after surgery? Improving blood sugar levels before and after surgery helps healing and can limit problems. A way of improving blood sugar control is eating a healthy diet by:  Eating less sugar and carbohydrates  Increasing activity/exercise  Talking with your doctor about reaching your blood sugar goals High blood sugars (greater than 180 mg/dL) can raise your risk of infections and slow your recovery, so you will need to focus on controlling your diabetes during the weeks before surgery. Make sure that the doctor who takes care of your diabetes knows about your planned surgery including the date and location.  How do I manage my blood sugar before surgery? Check your blood sugar at least 4 times a day, starting 2 days before surgery, to make sure that the level is not too high or low. Check your blood sugar the morning of your surgery when you wake up and every 2 hours until you get to the Short Stay unit. If your blood sugar is less than 70 mg/dL, you will need to treat for low blood sugar: Do not take insulin. Treat a low blood sugar (less than 70 mg/dL) with  cup of clear juice (cranberry or apple), 4 glucose tablets, OR glucose gel. Recheck blood sugar in 15 minutes after treatment (to make sure it is greater than 70 mg/dL). If your blood sugar is not greater than 70 mg/dL on recheck, call [phone] for further instructions. Report your blood sugar  to the short stay nurse when you get to Short Stay.  If you are admitted to the hospital after surgery: Your blood sugar will be checked by the staff and you will probably be given insulin after surgery (instead of oral diabetes medicines) to make sure you have good blood sugar levels. The goal for blood sugar control after surgery is 80-180 mg/dL.   WHAT DO I DO ABOUT MY DIABETES MEDICATION?  Do not take [place] after 10/6 Friday  Do not take your bedtime dose of [place] the night before surgery.  Do not take oral diabetes medicines (pills) the morning of surgery.       If your CBG is greater than 220 mg/dL, you may take  of your sliding scale  (correction) dose of insulin.     Bring CPAP mask and tubing day of surgery.                              You may not have any metal on your body including  jewelry, and body piercing  Do not wear lotions, powders, perfumes/cologne, or deodorant .               Men may shave face and neck.   Do not bring valuables to the hospital. [employer].   Contacts, dentures or bridgework may not be worn into surgery.   Bring small overnight bag day of surgery.   DO NOT [place]. PHARMACY WILL DISPENSE MEDICATIONS LISTED ON YOUR MEDICATION LIST TO YOU DURING YOUR ADMISSION [place]!       Special Instructions: Bring a copy of your healthcare power of attorney and living will documents   the day of surgery if you haven't scanned them before.              Please read over the following fact sheets you were given: IF YOU HAVE QUESTIONS ABOUT YOUR PRE-OP INSTRUCTIONS PLEASE CALL [phone]     [facility] - Preparing for Surgery Before surgery, you can play an important role.  Because skin is not sterile, your skin needs to be as free of germs as possible.  You can reduce the number of germs on your skin by washing with CHG (chlorahexidine  gluconate) soap before surgery.  CHG is an antiseptic cleaner which kills germs and bonds with the skin to continue killing germs even after washing. Please DO NOT use if you have an allergy to CHG or antibacterial soaps.  If your skin becomes reddened/irritated stop using the CHG and inform your nurse when you arrive at Short Stay..  You may shave your face/neck. Please follow these instructions carefully:  1.  Shower with CHG Soap the night before surgery and the  morning of Surgery.  2.  If you choose to wash your hair, wash your hair first as usual with your  normal  shampoo.  3.  After you shampoo, rinse your hair and body thoroughly to remove the  shampoo.                            4.  Use CHG as you would any other liquid soap.  You can apply chg directly  to the skin and wash                       Gently with a scrungie or clean washcloth.  5.  Apply the CHG Soap to your body ONLY FROM THE NECK DOWN.   Do not use on face/ open                           Wound or open sores. Avoid contact with eyes, ears mouth and genitals (private parts).                       Wash face,  Genitals (private parts) with your normal soap.             6.  Wash thoroughly, paying special attention to the area where your surgery  will be performed.  7.  Thoroughly rinse your body with warm water from the neck down.  8.  DO NOT shower/wash with your normal soap after using and rinsing off  the CHG Soap.  9.  Pat yourself dry with a clean towel.            10.  Wear clean pajamas.            11.  Place clean sheets on your bed the night of your first shower and do not  sleep with pets. Day of Surgery : Do not apply any lotions/deodorants the morning of surgery.  Please wear clean clothes to the hospital/surgery center.  FAILURE TO FOLLOW THESE INSTRUCTIONS MAY RESULT IN THE CANCELLATION OF YOUR SURGERY   ________________________________________________________________________   Incentive  Spirometer  An incentive spirometer is a tool that can help keep your lungs clear and active. This tool measures how well you are filling your lungs with each breath. Taking long deep breaths may help reverse or decrease the chance of developing breathing (pulmonary) problems (especially infection) following: A long period of time when you are unable to move or be active. BEFORE THE PROCEDURE  If the spirometer includes an indicator to show your best effort, your nurse or respiratory therapist will set it to a desired goal. If possible, sit up straight or lean slightly forward. Try not to slouch. Hold the incentive spirometer in an upright position. INSTRUCTIONS FOR USE  Sit on the edge of your bed if possible, or sit up as far as you can in bed or on a chair. Hold the incentive spirometer in an upright position. Breathe out normally. Place the mouthpiece in your mouth and seal your lips tightly around it. Breathe in slowly and as deeply as possible, raising the piston or the ball toward the top of the column. Hold your breath for 3-5 seconds or for as long as possible. Allow the piston or ball to fall to the bottom of the column. Remove the mouthpiece from your mouth and breathe out normally. Rest for a few seconds and repeat Steps 1 through 7 at least 10 times every 1-2 hours when you are awake. Take your time and take a few normal breaths between deep breaths. The spirometer may include an indicator to show your best effort. Use the indicator as a goal to work toward during each repetition. After each set of 10 deep breaths, practice coughing to be sure your lungs are clear. If you have an incision (the cut made at the time of surgery), support your incision when coughing by placing a pillow or rolled up towels firmly against it. Once you are able to get out of bed, walk around indoors and cough well. You may stop using the incentive spirometer when instructed by your caregiver.  RISKS AND  COMPLICATIONS Take your time so you do not get dizzy or light-headed. If you are in pain, you may need to take or ask for pain medication before doing incentive spirometry. It is harder to take a deep breath if you are having pain. AFTER USE Rest and breathe slowly and easily. It can be helpful to keep track of a log of your progress. Your caregiver can provide you with a simple table to help with this. If you are using the spirometer at home, follow these instructions: [place] IF:  You are having difficultly using the spirometer. You have trouble using the spirometer as often as instructed. Your pain medication is not giving enough relief while using the spirometer. You develop fever of 100.5 F (38.1 C) or higher. SEEK IMMEDIATE MEDICAL CARE IF:  You cough up bloody sputum that had not been present before.  You develop fever of 102 F (38.9 C) or greater. You develop worsening pain at or near the incision site. MAKE SURE YOU:  Understand these instructions. Will watch your condition. Will get help right away if you are not doing well or get worse. Document Released: 07/19/2006 Document Revised: 05/31/2011 Document Reviewed: 09/19/2006 [facility] Patient Information 2014 [place], [place].   ________________________________________________________________________

## 2021-12-24 ENCOUNTER — Encounter (HOSPITAL_COMMUNITY): Payer: Self-pay

## 2021-12-24 ENCOUNTER — Encounter (HOSPITAL_COMMUNITY)
Admission: RE | Admit: 2021-12-24 | Discharge: 2021-12-24 | Disposition: A | Discharge status: Home / Self Care | Payer: BC Managed Care – PPO | Source: Ambulatory Visit | Attending: Orthopaedic Surgery | Admitting: Orthopaedic Surgery

## 2021-12-24 ENCOUNTER — Other Ambulatory Visit: Payer: Self-pay

## 2021-12-24 DIAGNOSIS — Z01812 Encounter for preprocedural laboratory examination: Secondary | ICD-10-CM | POA: Diagnosis not present

## 2021-12-24 DIAGNOSIS — E119 Type 2 diabetes mellitus without complications: Secondary | ICD-10-CM | POA: Diagnosis not present

## 2021-12-24 DIAGNOSIS — Z01818 Encounter for other preprocedural examination: Secondary | ICD-10-CM

## 2021-12-24 DIAGNOSIS — Z794 Long term (current) use of insulin: Secondary | ICD-10-CM | POA: Diagnosis not present

## 2021-12-24 HISTORY — DX: Other complications of anesthesia, initial encounter: T88.59XA

## 2021-12-24 LAB — CBC
HCT: 42.8 % (ref 39.0–52.0)
Hemoglobin: 13.6 g/dL (ref 13.0–17.0)
MCH: 28.8 pg (ref 26.0–34.0)
MCHC: 31.8 g/dL (ref 30.0–36.0)
MCV: 90.7 fL (ref 80.0–100.0)
Platelets: 225 10*3/uL (ref 150–400)
RBC: 4.72 MIL/uL (ref 4.22–5.81)
RDW: 14.3 % (ref 11.5–15.5)
WBC: 4.8 10*3/uL (ref 4.0–10.5)
nRBC: 0 % (ref 0.0–0.2)

## 2021-12-24 LAB — BASIC METABOLIC PANEL
Anion gap: 6 (ref 5–15)
BUN: 23 mg/dL (ref 8–23)
CO2: 26 mmol/L (ref 22–32)
Calcium: 9.1 mg/dL (ref 8.9–10.3)
Chloride: 105 mmol/L (ref 98–111)
Creatinine, Ser: 1.86 mg/dL — ABNORMAL HIGH (ref 0.61–1.24)
GFR, Estimated: 40 mL/min — ABNORMAL LOW (ref >60)
Glucose, Bld: 108 mg/dL — ABNORMAL HIGH (ref 70–99)
Potassium: 4.7 mmol/L (ref 3.5–5.1)
Sodium: 137 mmol/L (ref 135–145)

## 2021-12-24 LAB — HEMOGLOBIN A1C
Hgb A1c MFr Bld: 5.9 % — ABNORMAL HIGH (ref 4.8–5.6)
Mean Plasma Glucose: 122.63 mg/dL

## 2021-12-24 LAB — GLUCOSE, CAPILLARY: Glucose-Capillary: 116 mg/dL — ABNORMAL HIGH (ref 70–99)

## 2021-12-24 LAB — SURGICAL PCR SCREEN
MRSA, PCR: NEGATIVE
Staphylococcus aureus: NEGATIVE

## 2021-12-24 NOTE — Progress Notes (Signed)
Anesthesia note:  Bowel prep reminder:NA  PCP - Dr. Electa Sniff Cardiologist -Dr. Alfonso Patten. Revankar Other-   Chest x-ray - 08/06/21-epic EKG - 11/27/21-epic Stress Test - 08/05/21-epic ECHO - 09/19/20-epic Cardiac Cath - na  Pacemaker/ICD device last checked:NA  Sleep Study - yes CPAP - yes   Fasting Blood Sugar - 110-180 Checks Blood Sugar __QD___  Blood Thinner:ASA 81 Blood Thinner Instructions: Aspirin Instructions:none Last Dose:  Anesthesia review: yes  Patient denies shortness of breath, fever, cough and chest pain at PAT appointment Pt denies SOB with his activity level. He said that after his last surgery the day after he had muscle weakness and body aches. He was told to let the anesthesiologist know so that they can use something else.  Patient verbalized understanding of instructions that were given to them at the PAT appointment. Patient was also instructed that they will need to review over the PAT instructions again at home before surgery. yes

## 2021-12-28 MED ORDER — TRANEXAMIC ACID 1000 MG/10ML IV SOLN
2000.0000 mg | INTRAVENOUS | Status: AC
  Filled 2021-12-28: qty 20

## 2021-12-28 NOTE — H&P (Signed)
TOTAL HIP ADMISSION H&P  Patient is admitted for right total hip arthroplasty.  Subjective:  Chief Complaint: right hip pain  HPI: [name] Sosa, 61 y.o. male, has a history of pain and functional disability in the right hip(s) due to arthritis and patient has failed non-surgical conservative treatments for greater than 12 weeks to include NSAID's and/or analgesics, corticosteriod injections, flexibility and strengthening excercises, supervised PT with diminished ADL's post treatment, use of assistive devices, weight reduction as appropriate, and activity modification.  Onset of symptoms was gradual starting 5 years ago with gradually worsening course since that time.The patient noted no past surgery on the right hip(s).  Patient currently rates pain in the right hip at 10 out of 10 with activity. Patient has night pain, worsening of pain with activity and weight bearing, trendelenberg gait, pain that interfers with activities of daily living, and crepitus. Patient has evidence of subchondral cysts, subchondral sclerosis, periarticular osteophytes, and joint space narrowing by imaging studies. This condition presents safety issues increasing the risk of falls. There is no current active infection.  Patient Active Problem List   Diagnosis Date Noted   Multinodular goiter (nontoxic) 10/16/2021   Substernal thyroid goiter 10/08/2021   Tracheal deviation 10/08/2021   Lumbar spondylosis 04/02/2021   Primary localized osteoarthritis of pelvic region and thigh 04/02/2021   Adenomatous polyp of colon 05/14/2020   Chronic rhinitis 05/14/2020   Diabetic renal disease ([place]) 05/14/2020   History of adenomatous polyp of colon 05/14/2020   Hydrocele 05/14/2020   Long term (current) use of insulin ([place]) 05/14/2020   Pure hypercholesterolemia 05/14/2020   Sciatica, right side 05/14/2020   Skin sensation disturbance 05/14/2020   Subclinical hypothyroidism 05/14/2020   Testicular hypofunction 05/14/2020    Thyroid nodule 05/14/2020   Gastro-esophageal reflux disease without esophagitis 05/14/2020   Bariatric surgery status 05/14/2020   Body mass index (BMI) 45.0-49.9, adult ([place]) 05/14/2020   Obesity 05/14/2020   Anemia    Anxiety    Apnea, sleep    Cancer ([place])    Cardiomyopathy, nonischemic (HCC)    Chronic kidney disease    Chronic systolic heart failure (HCC)    Claustrophobia    GERD (gastroesophageal reflux disease)    Hyperlipidemia    Hypertension    Multiple thyroid nodules    Non-Hodgkin lymphoma (HCC)    Shortness of breath    Cardiomyopathy ([place]) 04/27/2018   Morbid obesity ([place]) 11/25/2017   Nonischemic cardiomyopathy ([place]) 11/25/2017   History of Lap Roux-en-Y gastric bypass 03/05/13 03/23/2013   IDDM (insulin dependent diabetes mellitus) 12/08/2012   Obstructive sleep apnea 12/08/2012   CKD (chronic kidney disease), stage III ([place]) 12/08/2012   Diabetes ([place]) 04/27/2012   Diffuse large cell lymphoma in remission ([place]) 04/27/2012   CHF (congestive heart failure) ([place]) 2003   Past Medical History:  Diagnosis Date   Anemia    Anxiety    HX OF ANXIETY ATTACKS--CAN'T SLEEP ON BACK-FEELS LIKE HE CAN'T BREATHE   Apnea, sleep    SEVERE OSA PER STUDY 12/01/12   Arthritis    Cancer ([place])    HX OF NON HODGKIN'S LYMPHOMA -IN REMISSION   Cardiomyopathy ([place]) 04/27/2018   Cardiomyopathy, nonischemic ([place])    DX 2003 CARDIAC CATH, ECHO 2006 SHOWED EF IMPROVED TO 45-50%--PER CARDIOLOGY OFFICE NOTES DR. Linard Millers FROM 01/20/10   Chronic kidney disease    HX OF CHRONIC KIDNEY DISEASE STAGE III - EVALUATED AT [facility] IN THE PAST AND FELT TO BE RELATED TO NSAID'S AND  CHEMO.  PT  STATES HE HAS NOT SEEN KIDNEY SPECIALIST AT [facility] IN OVER A YEAR.   Chronic rhinitis 63/33/5456   Chronic systolic heart failure ([place])    CARDIOLOGIST IS DR. Linard Millers   CKD (chronic kidney disease), stage III ([place]) 12/08/2012   Claustrophobia    Complication of anesthesia    had muscle weakness  and body aches the day after the surgery   Diabetic renal disease ([place]) 05/14/2020   Diffuse large cell lymphoma in remission ([place]) 04/27/2012   History of adenomatous polyp of colon 05/14/2020   History of Lap Roux-en-Y gastric bypass 03/05/13 03/23/2013   Hydrocele 05/14/2020   Hyperlipidemia    Hypertension    IDDM (insulin dependent diabetes mellitus) 12/08/2012   Morbid obesity ([place])    Multiple thyroid nodules    HX OF NODULES=PT STATES BIOPSY-TOLD ALL OKAY   Non-Hodgkin lymphoma ([place])    DX ABOUT 2007-TX'D WITH CHEMO-IN REMISSION   Nonischemic cardiomyopathy ([place]) 11/25/2017   Obstructive sleep apnea 12/08/2012   Sciatica, right side 05/14/2020   Skin sensation disturbance 05/14/2020   Subclinical hypothyroidism 05/14/2020   Testicular hypofunction 05/14/2020    Past Surgical History:  Procedure Laterality Date   BIOPSY  09/25/2019   Procedure: BIOPSY;  Surgeon: Otis Brace, MD;  Location: [facility];  Service: Gastroenterology;;   COLONOSCOPY WITH PROPOFOL N/A 01/09/2013   Procedure: COLONOSCOPY WITH PROPOFOL;  Surgeon: Garlan Fair, MD;  Location: [facility];  Service: Endoscopy;  Laterality: N/A;   COLONOSCOPY WITH PROPOFOL N/A 09/25/2019   Procedure: COLONOSCOPY WITH PROPOFOL;  Surgeon: Otis Brace, MD;  Location: [facility];  Service: Gastroenterology;  Laterality: N/A;   GASTRIC ROUX-EN-Y N/A 03/05/2013   Procedure: LAPAROSCOPIC ROUX-EN-Y GASTRIC BYPASS WITH UPPER ENDOSCOPY;  Surgeon: Gayland Curry, MD;  Location: [facility];  Service: General;  Laterality: N/A;   HYDROCELE EXCISION  02/09/2011   Procedure: HYDROCELECTOMY ADULT;  Surgeon: Fredricka Bonine, MD;  Location: [facility];  Service: Urology;  Laterality: Right;   POLYPECTOMY  09/25/2019   Procedure: POLYPECTOMY;  Surgeon: Otis Brace, MD;  Location: [facility];  Service: Gastroenterology;;   THYROID LOBECTOMY N/A 10/16/2021   Procedure: LEFT THYROID LOBECTOMY;  Surgeon: Armandina Gemma, MD;  Location: [facility];  Service: General;  Laterality: N/A;   WISDOM TOOTH EXTRACTION      Current Facility-Administered Medications  Medication Dose Route Frequency Provider Last Rate Last Admin   [START ON 12/29/2021] tranexamic acid (CYKLOKAPRON) 2,000 mg in sodium chloride 0.9 % 50 mL Topical Application  2,563 mg Topical To OR Melrose Nakayama, MD       Current Outpatient Medications  Medication Sig Dispense Refill Last Dose   acetaminophen (TYLENOL) 650 MG CR tablet Take 1,300 mg by mouth every 8 (eight) hours as needed for pain.      ALPRAZolam (XANAX) 0.5 MG tablet Take 0.5 mg by mouth 2 (two) times daily as needed for anxiety.      aspirin EC 81 MG tablet Take 1 tablet (81 mg total) by mouth daily. 90 tablet 3    CALCIUM CITRATE PO Take 2 tablets by mouth 2 (two) times daily.      carvedilol (COREG) 25 MG tablet Take 1 tablet (25 mg total) by mouth 2 (two) times daily. 180 tablet 3    FARXIGA 10 MG TABS tablet Take 10 mg by mouth daily.      HUMALOG KWIKPEN 200 UNIT/ML KwikPen Inject 20-25 Units into the skin 3 (three) times daily with  meals.      ibuprofen (ADVIL) 400 MG tablet Take 400 mg by mouth every 6 (six) hours as needed for moderate pain.      Multiple Vitamins-Minerals (BARIATRIC MULTIVITAMINS/IRON PO) Take 1 capsule by mouth daily.      sacubitril-valsartan (ENTRESTO) 49-51 MG Take 1 tablet by mouth 2 (two) times daily. 180 tablet 3    SOLIQUA 100-33 UNT-MCG/ML SOPN Inject 30 Units into the skin at bedtime.      traMADol (ULTRAM) 50 MG tablet Take 100 mg by mouth every 12 (twelve) hours.      zolpidem (AMBIEN) 10 MG tablet Take 10 mg by mouth at bedtime.      NON FORMULARY Pt uses a cpap      oxyCODONE (OXY IR/ROXICODONE) 5 MG immediate release tablet Take 1 tablet (5 mg total) by mouth every 6 (six) hours as needed for moderate pain. (Patient not taking: Reported on 12/17/2021) 20 tablet 0 Not Taking   Allergies  Allergen Reactions   Aciphex [Rabeprazole Sodium]      Doesn't work    Acyclovir And Related     unkn   Nsaids     AVOIDS NSAID'S DUE TO HX OF CHRONIC KIDNEY DISEASE    Social History   Tobacco Use   Smoking status: Never   Smokeless tobacco: Never  Substance Use Topics   Alcohol use: Yes    Comment: RARELY    Family History  Adopted: Yes  Family history unknown: Yes     Review of Systems  Musculoskeletal:  Positive for arthralgias.       Right hip  All other systems reviewed and are negative.   Objective:  Physical Exam Constitutional:      Appearance: Normal appearance.  HENT:     Head: Normocephalic and atraumatic.     Mouth/Throat:     Pharynx: Oropharynx is clear.  Eyes:     Extraocular Movements: Extraocular movements intact.  Pulmonary:     Effort: Pulmonary effort is normal.  Abdominal:     Palpations: Abdomen is soft.  Musculoskeletal:     Cervical back: Normal range of motion.     Comments: Examination right hip shows extremely limited range of motion with a terrible plane with any internal or external rotation.  He walks with a significant limp.  He does continue with some central obesity.    Skin:    General: Skin is warm and dry.  Neurological:     General: No focal deficit present.     Mental Status: He is alert and oriented to person, place, and time. Mental status is at baseline.  Psychiatric:        Mood and Affect: Mood normal.        Behavior: Behavior normal.        Thought Content: Thought content normal.        Judgment: Judgment normal.     Vital signs in last 24 hours:    Labs:   Estimated body mass index is 42.95 kg/m as calculated from the following:   Height as of 12/24/21: 6' 1.5" (1.867 m).   Weight as of 12/24/21: 149.7 kg.   Imaging Review Plain radiographs demonstrate severe degenerative joint disease of the right hip(s). The bone quality appears to be good for age and reported activity level.      Assessment/Plan:  End stage primary arthritis, right  hip(s)  The patient history, physical examination, clinical judgement of the provider and imaging studies are  consistent with end stage degenerative joint disease of the right hip(s) and total hip arthroplasty is deemed medically necessary. The treatment options including medical management, injection therapy, arthroscopy and arthroplasty were discussed at length. The risks and benefits of total hip arthroplasty were presented and reviewed. The risks due to aseptic loosening, infection, stiffness, dislocation/subluxation,  thromboembolic complications and other imponderables were discussed.  The patient acknowledged the explanation, agreed to proceed with the plan and consent was signed. Patient is being admitted for inpatient treatment for surgery, pain control, PT, OT, prophylactic antibiotics, VTE prophylaxis, progressive ambulation and ADL's and discharge planning.The patient is planning to be discharged home with home health services

## 2021-12-29 ENCOUNTER — Ambulatory Visit (HOSPITAL_COMMUNITY): Payer: BC Managed Care – PPO

## 2021-12-29 ENCOUNTER — Ambulatory Visit: Payer: BC Managed Care – PPO | Admitting: Cardiology

## 2021-12-29 ENCOUNTER — Other Ambulatory Visit: Payer: Self-pay

## 2021-12-29 ENCOUNTER — Encounter (HOSPITAL_COMMUNITY)
Admission: RE | Disposition: A | Discharge status: Home Health | Payer: Self-pay | Source: Ambulatory Visit | Attending: Orthopaedic Surgery

## 2021-12-29 ENCOUNTER — Observation Stay (HOSPITAL_COMMUNITY)
Admission: RE | Admit: 2021-12-29 | Discharge: 2021-12-30 | Disposition: A | Discharge status: Home Health | Payer: BC Managed Care – PPO | Source: Ambulatory Visit | Attending: Orthopaedic Surgery | Admitting: Orthopaedic Surgery

## 2021-12-29 ENCOUNTER — Ambulatory Visit (HOSPITAL_COMMUNITY): Payer: BC Managed Care – PPO | Admitting: Physician Assistant

## 2021-12-29 ENCOUNTER — Encounter (HOSPITAL_COMMUNITY): Payer: Self-pay | Admitting: Orthopaedic Surgery

## 2021-12-29 ENCOUNTER — Ambulatory Visit (HOSPITAL_COMMUNITY): Payer: BC Managed Care – PPO | Admitting: Anesthesiology

## 2021-12-29 DIAGNOSIS — E1122 Type 2 diabetes mellitus with diabetic chronic kidney disease: Secondary | ICD-10-CM | POA: Insufficient documentation

## 2021-12-29 DIAGNOSIS — M1611 Unilateral primary osteoarthritis, right hip: Secondary | ICD-10-CM | POA: Diagnosis not present

## 2021-12-29 DIAGNOSIS — I110 Hypertensive heart disease with heart failure: Secondary | ICD-10-CM | POA: Diagnosis not present

## 2021-12-29 DIAGNOSIS — E119 Type 2 diabetes mellitus without complications: Secondary | ICD-10-CM | POA: Diagnosis not present

## 2021-12-29 DIAGNOSIS — Z79899 Other long term (current) drug therapy: Secondary | ICD-10-CM | POA: Insufficient documentation

## 2021-12-29 DIAGNOSIS — N183 Chronic kidney disease, stage 3 unspecified: Secondary | ICD-10-CM | POA: Diagnosis not present

## 2021-12-29 DIAGNOSIS — I130 Hypertensive heart and chronic kidney disease with heart failure and stage 1 through stage 4 chronic kidney disease, or unspecified chronic kidney disease: Secondary | ICD-10-CM | POA: Diagnosis not present

## 2021-12-29 DIAGNOSIS — Z7982 Long term (current) use of aspirin: Secondary | ICD-10-CM | POA: Insufficient documentation

## 2021-12-29 DIAGNOSIS — Z471 Aftercare following joint replacement surgery: Secondary | ICD-10-CM | POA: Diagnosis not present

## 2021-12-29 DIAGNOSIS — E039 Hypothyroidism, unspecified: Secondary | ICD-10-CM | POA: Diagnosis not present

## 2021-12-29 DIAGNOSIS — I5022 Chronic systolic (congestive) heart failure: Secondary | ICD-10-CM | POA: Diagnosis not present

## 2021-12-29 DIAGNOSIS — Z96641 Presence of right artificial hip joint: Secondary | ICD-10-CM | POA: Diagnosis not present

## 2021-12-29 DIAGNOSIS — Z794 Long term (current) use of insulin: Secondary | ICD-10-CM | POA: Diagnosis not present

## 2021-12-29 DIAGNOSIS — I509 Heart failure, unspecified: Secondary | ICD-10-CM | POA: Diagnosis not present

## 2021-12-29 DIAGNOSIS — Z01818 Encounter for other preprocedural examination: Secondary | ICD-10-CM

## 2021-12-29 DIAGNOSIS — Z8572 Personal history of non-Hodgkin lymphomas: Secondary | ICD-10-CM | POA: Insufficient documentation

## 2021-12-29 HISTORY — DX: Unilateral primary osteoarthritis, right hip: M16.11

## 2021-12-29 HISTORY — PX: TOTAL HIP ARTHROPLASTY: SHX124

## 2021-12-29 LAB — GLUCOSE, CAPILLARY
Glucose-Capillary: 92 mg/dL (ref 70–99)
Glucose-Capillary: 82 mg/dL (ref 70–99)
Glucose-Capillary: 289 mg/dL — ABNORMAL HIGH (ref 70–99)

## 2021-12-29 LAB — TYPE AND SCREEN
ABO/RH(D): B NEG
Antibody Screen: NEGATIVE

## 2021-12-29 SURGERY — ARTHROPLASTY, HIP, TOTAL, ANTERIOR APPROACH
Anesthesia: General | Site: Hip | Laterality: Right

## 2021-12-29 MED ORDER — ASPIRIN 81 MG PO CHEW
81.0000 mg | CHEWABLE_TABLET | Freq: Two times a day (BID) | ORAL | Status: DC
  Administered 2021-12-30: 81 mg via ORAL
  Filled 2021-12-29: qty 1

## 2021-12-29 MED ORDER — BUPIVACAINE LIPOSOME 1.3 % IJ SUSP: 10.0000 mL | Freq: Once | INTRAMUSCULAR | Status: AC

## 2021-12-29 MED ORDER — LACTATED RINGERS IV SOLN: INTRAVENOUS | Status: DC

## 2021-12-29 MED ORDER — LACTATED RINGERS IV SOLN: INTRAVENOUS | Status: DC | PRN

## 2021-12-29 MED ORDER — TRANEXAMIC ACID-NACL 1000-0.7 MG/100ML-% IV SOLN
1000.0000 mg | Freq: Once | INTRAVENOUS | Status: AC
  Administered 2021-12-29: 1000 mg via INTRAVENOUS
  Filled 2021-12-29: qty 100

## 2021-12-29 MED ORDER — MIDAZOLAM HCL 2 MG/2ML IJ SOLN
INTRAMUSCULAR | Status: AC
  Filled 2021-12-29: qty 2

## 2021-12-29 MED ORDER — ONDANSETRON HCL 4 MG/2ML IJ SOLN
INTRAMUSCULAR | Status: DC | PRN
  Administered 2021-12-29: 4 mg via INTRAVENOUS

## 2021-12-29 MED ORDER — ROCURONIUM BROMIDE 10 MG/ML (PF) SYRINGE
PREFILLED_SYRINGE | INTRAVENOUS | Status: AC
  Filled 2021-12-29: qty 10

## 2021-12-29 MED ORDER — ALBUMIN HUMAN 5 % IV SOLN
INTRAVENOUS | Status: AC
  Filled 2021-12-29: qty 250

## 2021-12-29 MED ORDER — PHENYLEPHRINE HCL (PRESSORS) 10 MG/ML IV SOLN
INTRAVENOUS | Status: AC
  Filled 2021-12-29: qty 1

## 2021-12-29 MED ORDER — AMISULPRIDE (ANTIEMETIC) 5 MG/2ML IV SOLN: 10.0000 mg | Freq: Once | INTRAVENOUS | Status: DC | PRN

## 2021-12-29 MED ORDER — MORPHINE SULFATE (PF) 2 MG/ML IV SOLN: 0.5000 mg | INTRAVENOUS | Status: DC | PRN

## 2021-12-29 MED ORDER — BISACODYL 5 MG PO TBEC: 5.0000 mg | DELAYED_RELEASE_TABLET | Freq: Every day | ORAL | Status: DC | PRN

## 2021-12-29 MED ORDER — SUGAMMADEX SODIUM 200 MG/2ML IV SOLN
INTRAVENOUS | Status: DC | PRN
  Administered 2021-12-29: 400 mg via INTRAVENOUS

## 2021-12-29 MED ORDER — OXYCODONE HCL 5 MG PO TABS
5.0000 mg | ORAL_TABLET | Freq: Once | ORAL | Status: AC | PRN
  Administered 2021-12-29: 5 mg via ORAL

## 2021-12-29 MED ORDER — HYDROCODONE-ACETAMINOPHEN 7.5-325 MG PO TABS
1.0000 | ORAL_TABLET | ORAL | Status: DC | PRN
  Administered 2021-12-29: 2 via ORAL
  Administered 2021-12-30: 1 via ORAL
  Filled 2021-12-29 (×2): qty 2

## 2021-12-29 MED ORDER — TRANEXAMIC ACID 1000 MG/10ML IV SOLN
INTRAVENOUS | Status: DC | PRN
  Administered 2021-12-29: 2000 mg via TOPICAL

## 2021-12-29 MED ORDER — KETOROLAC TROMETHAMINE 15 MG/ML IJ SOLN
15.0000 mg | Freq: Four times a day (QID) | INTRAMUSCULAR | Status: DC
  Administered 2021-12-29 – 2021-12-30 (×3): 15 mg via INTRAVENOUS
  Filled 2021-12-29 (×3): qty 1

## 2021-12-29 MED ORDER — PROPOFOL 10 MG/ML IV BOLUS
INTRAVENOUS | Status: AC
  Filled 2021-12-29: qty 20

## 2021-12-29 MED ORDER — METHOCARBAMOL 500 MG IVPB - SIMPLE MED
500.0000 mg | Freq: Four times a day (QID) | INTRAVENOUS | Status: DC | PRN
  Administered 2021-12-29: 500 mg via INTRAVENOUS

## 2021-12-29 MED ORDER — MIDAZOLAM HCL 5 MG/5ML IJ SOLN
INTRAMUSCULAR | Status: DC | PRN
  Administered 2021-12-29: 2 mg via INTRAVENOUS

## 2021-12-29 MED ORDER — FENTANYL CITRATE (PF) 100 MCG/2ML IJ SOLN
INTRAMUSCULAR | Status: DC | PRN
  Administered 2021-12-29: 100 ug via INTRAVENOUS
  Administered 2021-12-29 (×2): 50 ug via INTRAVENOUS

## 2021-12-29 MED ORDER — ONDANSETRON HCL 4 MG PO TABS: 4.0000 mg | ORAL_TABLET | Freq: Four times a day (QID) | ORAL | Status: DC | PRN

## 2021-12-29 MED ORDER — ACETAMINOPHEN 325 MG PO TABS: 325.0000 mg | ORAL_TABLET | Freq: Four times a day (QID) | ORAL | Status: DC | PRN

## 2021-12-29 MED ORDER — ORAL CARE MOUTH RINSE: 15.0000 mL | Freq: Once | OROMUCOSAL | Status: AC

## 2021-12-29 MED ORDER — CHLORHEXIDINE GLUCONATE 0.12 % MT SOLN
15.0000 mL | Freq: Once | OROMUCOSAL | Status: AC
  Administered 2021-12-29: 15 mL via OROMUCOSAL

## 2021-12-29 MED ORDER — FENTANYL CITRATE PF 50 MCG/ML IJ SOSY
25.0000 ug | PREFILLED_SYRINGE | INTRAMUSCULAR | Status: DC | PRN
  Administered 2021-12-29 (×3): 50 ug via INTRAVENOUS

## 2021-12-29 MED ORDER — FENTANYL CITRATE (PF) 100 MCG/2ML IJ SOLN
INTRAMUSCULAR | Status: AC
  Filled 2021-12-29: qty 2

## 2021-12-29 MED ORDER — ALBUMIN HUMAN 5 % IV SOLN: INTRAVENOUS | Status: DC | PRN

## 2021-12-29 MED ORDER — SUCCINYLCHOLINE CHLORIDE 200 MG/10ML IV SOSY
PREFILLED_SYRINGE | INTRAVENOUS | Status: AC
  Filled 2021-12-29: qty 10

## 2021-12-29 MED ORDER — METHOCARBAMOL 500 MG IVPB - SIMPLE MED
INTRAVENOUS | Status: AC
  Filled 2021-12-29: qty 55

## 2021-12-29 MED ORDER — INSULIN ASPART 100 UNIT/ML IJ SOLN
0.0000 [IU] | Freq: Three times a day (TID) | INTRAMUSCULAR | Status: DC
  Administered 2021-12-30: 7 [IU] via SUBCUTANEOUS
  Administered 2021-12-30: 15 [IU] via SUBCUTANEOUS

## 2021-12-29 MED ORDER — PHENYLEPHRINE HCL-NACL 20-0.9 MG/250ML-% IV SOLN
INTRAVENOUS | Status: DC | PRN
  Administered 2021-12-29: 50 ug/min via INTRAVENOUS

## 2021-12-29 MED ORDER — BUPIVACAINE-EPINEPHRINE (PF) 0.25% -1:200000 IJ SOLN
INTRAMUSCULAR | Status: DC | PRN
  Administered 2021-12-29: 30 mL

## 2021-12-29 MED ORDER — CARVEDILOL 25 MG PO TABS
25.0000 mg | ORAL_TABLET | Freq: Two times a day (BID) | ORAL | Status: DC
  Administered 2021-12-29 – 2021-12-30 (×2): 25 mg via ORAL
  Filled 2021-12-29 (×2): qty 1

## 2021-12-29 MED ORDER — FENTANYL CITRATE PF 50 MCG/ML IJ SOSY
PREFILLED_SYRINGE | INTRAMUSCULAR | Status: AC
  Filled 2021-12-29: qty 1

## 2021-12-29 MED ORDER — CEFAZOLIN SODIUM-DEXTROSE 2-4 GM/100ML-% IV SOLN
2.0000 g | Freq: Four times a day (QID) | INTRAVENOUS | Status: AC
  Administered 2021-12-29 – 2021-12-30 (×2): 2 g via INTRAVENOUS
  Filled 2021-12-29 (×2): qty 100

## 2021-12-29 MED ORDER — LIDOCAINE 2% (20 MG/ML) 5 ML SYRINGE
INTRAMUSCULAR | Status: DC | PRN
  Administered 2021-12-29: 100 mg via INTRAVENOUS

## 2021-12-29 MED ORDER — MENTHOL 3 MG MT LOZG: 1.0000 | LOZENGE | OROMUCOSAL | Status: DC | PRN

## 2021-12-29 MED ORDER — OXYCODONE HCL 5 MG PO TABS
ORAL_TABLET | ORAL | Status: AC
  Filled 2021-12-29: qty 1

## 2021-12-29 MED ORDER — METOCLOPRAMIDE HCL 5 MG/ML IJ SOLN: 5.0000 mg | Freq: Three times a day (TID) | INTRAMUSCULAR | Status: DC | PRN

## 2021-12-29 MED ORDER — ROCURONIUM BROMIDE 10 MG/ML (PF) SYRINGE
PREFILLED_SYRINGE | INTRAVENOUS | Status: DC | PRN
  Administered 2021-12-29 (×2): 20 mg via INTRAVENOUS
  Administered 2021-12-29: 80 mg via INTRAVENOUS

## 2021-12-29 MED ORDER — DEXAMETHASONE SODIUM PHOSPHATE 10 MG/ML IJ SOLN
INTRAMUSCULAR | Status: DC | PRN
  Administered 2021-12-29: 10 mg via INTRAVENOUS

## 2021-12-29 MED ORDER — DIPHENHYDRAMINE HCL 12.5 MG/5ML PO ELIX: 12.5000 mg | ORAL_SOLUTION | ORAL | Status: DC | PRN

## 2021-12-29 MED ORDER — ALPRAZOLAM 0.5 MG PO TABS
0.5000 mg | ORAL_TABLET | Freq: Two times a day (BID) | ORAL | Status: DC | PRN
  Administered 2021-12-29: 0.5 mg via ORAL
  Filled 2021-12-29: qty 1

## 2021-12-29 MED ORDER — DAPAGLIFLOZIN PROPANEDIOL 10 MG PO TABS
10.0000 mg | ORAL_TABLET | Freq: Every day | ORAL | Status: DC
  Administered 2021-12-30: 10 mg via ORAL
  Filled 2021-12-29: qty 1

## 2021-12-29 MED ORDER — MEPIVACAINE HCL (PF) 2 % IJ SOLN
INTRAMUSCULAR | Status: AC
  Filled 2021-12-29: qty 20

## 2021-12-29 MED ORDER — PHENYLEPHRINE HCL (PRESSORS) 10 MG/ML IV SOLN
INTRAVENOUS | Status: DC | PRN
  Administered 2021-12-29: 80 ug via INTRAVENOUS
  Administered 2021-12-29: 160 ug via INTRAVENOUS

## 2021-12-29 MED ORDER — METOCLOPRAMIDE HCL 5 MG PO TABS: 5.0000 mg | ORAL_TABLET | Freq: Three times a day (TID) | ORAL | Status: DC | PRN

## 2021-12-29 MED ORDER — SACUBITRIL-VALSARTAN 49-51 MG PO TABS
1.0000 | ORAL_TABLET | Freq: Two times a day (BID) | ORAL | Status: DC
  Administered 2021-12-29 – 2021-12-30 (×2): 1 via ORAL
  Filled 2021-12-29 (×2): qty 1

## 2021-12-29 MED ORDER — ACETAMINOPHEN 500 MG PO TABS
500.0000 mg | ORAL_TABLET | Freq: Four times a day (QID) | ORAL | Status: DC
  Administered 2021-12-29 – 2021-12-30 (×3): 500 mg via ORAL
  Filled 2021-12-29 (×4): qty 1

## 2021-12-29 MED ORDER — BUPIVACAINE LIPOSOME 1.3 % IJ SUSP
INTRAMUSCULAR | Status: DC | PRN
  Administered 2021-12-29: 10 mL

## 2021-12-29 MED ORDER — CEFAZOLIN IN SODIUM CHLORIDE 3-0.9 GM/100ML-% IV SOLN
3.0000 g | INTRAVENOUS | Status: AC
  Administered 2021-12-29: 3 g via INTRAVENOUS
  Filled 2021-12-29: qty 100

## 2021-12-29 MED ORDER — ONDANSETRON HCL 4 MG/2ML IJ SOLN: 4.0000 mg | Freq: Four times a day (QID) | INTRAMUSCULAR | Status: DC | PRN

## 2021-12-29 MED ORDER — 0.9 % SODIUM CHLORIDE (POUR BTL) OPTIME
TOPICAL | Status: DC | PRN
  Administered 2021-12-29: 1000 mL

## 2021-12-29 MED ORDER — METHOCARBAMOL 500 MG PO TABS
500.0000 mg | ORAL_TABLET | Freq: Four times a day (QID) | ORAL | Status: DC | PRN
  Administered 2021-12-30 (×2): 500 mg via ORAL
  Filled 2021-12-29 (×2): qty 1

## 2021-12-29 MED ORDER — TRANEXAMIC ACID-NACL 1000-0.7 MG/100ML-% IV SOLN
1000.0000 mg | INTRAVENOUS | Status: AC
  Administered 2021-12-29: 1000 mg via INTRAVENOUS

## 2021-12-29 MED ORDER — POVIDONE-IODINE 10 % EX SWAB
2.0000 | Freq: Once | CUTANEOUS | Status: AC
  Administered 2021-12-29: 2 via TOPICAL

## 2021-12-29 MED ORDER — PHENOL 1.4 % MT LIQD: 1.0000 | OROMUCOSAL | Status: DC | PRN

## 2021-12-29 MED ORDER — ALUM & MAG HYDROXIDE-SIMETH 200-200-20 MG/5ML PO SUSP: 30.0000 mL | ORAL | Status: DC | PRN

## 2021-12-29 MED ORDER — OXYCODONE HCL 5 MG/5ML PO SOLN: 5.0000 mg | Freq: Once | ORAL | Status: AC | PRN

## 2021-12-29 MED ORDER — PROPOFOL 10 MG/ML IV BOLUS
INTRAVENOUS | Status: DC | PRN
  Administered 2021-12-29: 150 mg via INTRAVENOUS
  Administered 2021-12-29: 30 mg via INTRAVENOUS
  Administered 2021-12-29: 20 mg via INTRAVENOUS

## 2021-12-29 MED ORDER — DOCUSATE SODIUM 100 MG PO CAPS
100.0000 mg | ORAL_CAPSULE | Freq: Two times a day (BID) | ORAL | Status: DC
  Administered 2021-12-29 – 2021-12-30 (×2): 100 mg via ORAL
  Filled 2021-12-29 (×2): qty 1

## 2021-12-29 MED ORDER — HYDROCODONE-ACETAMINOPHEN 5-325 MG PO TABS
1.0000 | ORAL_TABLET | ORAL | Status: DC | PRN
  Administered 2021-12-30: 2 via ORAL
  Filled 2021-12-29: qty 2

## 2021-12-29 MED ORDER — ACETAMINOPHEN 500 MG PO TABS
1000.0000 mg | ORAL_TABLET | Freq: Once | ORAL | Status: DC
  Filled 2021-12-29: qty 2

## 2021-12-29 SURGICAL SUPPLY — 47 items
BAG COUNTER SPONGE SURGICOUNT (BAG) IMPLANT
BAG DECANTER FOR FLEXI CONT (MISCELLANEOUS) ×1 IMPLANT
BLADE SAW SGTL 18X1.27X75 (BLADE) ×1 IMPLANT
BOOTIES KNEE HIGH SLOAN (MISCELLANEOUS) ×1 IMPLANT
CELLS DAT CNTRL 66122 CELL SVR (MISCELLANEOUS) ×1 IMPLANT
COVER PERINEAL POST (MISCELLANEOUS) ×1 IMPLANT
COVER SURGICAL LIGHT HANDLE (MISCELLANEOUS) ×1 IMPLANT
CUP ACETABULAR GRIPTON 100 52 (Orthopedic Implant) IMPLANT
DRAPE FOOT SWITCH (DRAPES) ×1 IMPLANT
DRAPE IMP U-DRAPE 54X76 (DRAPES) ×1 IMPLANT
DRAPE STERI IOBAN 125X83 (DRAPES) ×1 IMPLANT
DRAPE U-SHAPE 47X51 STRL (DRAPES) ×2 IMPLANT
DRSG AQUACEL AG ADV 3.5X 6 (GAUZE/BANDAGES/DRESSINGS) ×1 IMPLANT
DRSG AQUACEL AG ADV 3.5X10 (GAUZE/BANDAGES/DRESSINGS) IMPLANT
DURAPREP 26ML APPLICATOR (WOUND CARE) ×1 IMPLANT
ELECT BLADE TIP CTD 4 INCH (ELECTRODE) ×1 IMPLANT
ELECT REM PT RETURN 15FT ADLT (MISCELLANEOUS) ×1 IMPLANT
ELIMINATOR HOLE APEX DEPUY (Hips) IMPLANT
GLOVE BIO SURGEON STRL SZ8 (GLOVE) ×2 IMPLANT
GLOVE BIOGEL PI IND STRL 8 (GLOVE) ×2 IMPLANT
GOWN STRL REUS W/ TWL XL LVL3 (GOWN DISPOSABLE) ×2 IMPLANT
GOWN STRL REUS W/TWL XL LVL3 (GOWN DISPOSABLE) ×2
GRIPTON 100 52 (Orthopedic Implant) ×1 IMPLANT
HEAD CERAMIC 36 PLUS 8.5 12 14 (Hips) IMPLANT
HEAD CERAMIC DELTA 36 PLUS 1.5 (Hips) IMPLANT
HOLDER FOLEY CATH W/STRAP (MISCELLANEOUS) ×1 IMPLANT
KIT TURNOVER KIT A (KITS) IMPLANT
LINER NEUTRAL 52X36MM PLUS 4 (Liner) IMPLANT
MANIFOLD NEPTUNE II (INSTRUMENTS) ×1 IMPLANT
NEEDLE HYPO 22GX1.5 SAFETY (NEEDLE) ×1 IMPLANT
NS IRRIG 1000ML POUR BTL (IV SOLUTION) ×1 IMPLANT
PACK ANTERIOR HIP CUSTOM (KITS) ×1 IMPLANT
PENCIL SMOKE EVACUATOR (MISCELLANEOUS) IMPLANT
PROTECTOR NERVE ULNAR (MISCELLANEOUS) ×1 IMPLANT
RETRACTOR WND ALEXIS 18 MED (MISCELLANEOUS) ×1 IMPLANT
RTRCTR WOUND ALEXIS 18CM MED (MISCELLANEOUS) ×1
SPIKE FLUID TRANSFER (MISCELLANEOUS) ×1 IMPLANT
STEM FEMORAL SZ5 HIGH ACTIS (Stem) IMPLANT
STRIP CLOSURE SKIN 1/2X4 (GAUZE/BANDAGES/DRESSINGS) IMPLANT
SUT ETHIBOND NAB CT1 #1 30IN (SUTURE) ×2 IMPLANT
SUT VIC AB 1 CT1 36 (SUTURE) ×1 IMPLANT
SUT VIC AB 2-0 CT1 27 (SUTURE) ×1
SUT VIC AB 2-0 CT1 TAPERPNT 27 (SUTURE) ×1 IMPLANT
SUT VICRYL AB 3-0 FS1 BRD 27IN (SUTURE) ×1 IMPLANT
SUT VLOC 180 0 24IN GS25 (SUTURE) ×1 IMPLANT
SYR 50ML LL SCALE MARK (SYRINGE) ×1 IMPLANT
TRAY FOLEY MTR SLVR 16FR STAT (SET/KITS/TRAYS/PACK) ×1 IMPLANT

## 2021-12-29 NOTE — Anesthesia Preprocedure Evaluation (Addendum)
Anesthesia Evaluation  Patient identified by MRN, date of birth, ID band Patient awake    Reviewed: Allergy & Precautions, NPO status , Patient's Chart, lab work & pertinent test results, reviewed documented beta blocker date and time   History of Anesthesia Complications (+) history of anesthetic complications  Airway Mallampati: II  TM Distance: >3 FB Neck ROM: Full    Dental  (+) Missing,    Pulmonary sleep apnea ,    Pulmonary exam normal        Cardiovascular hypertension, Pt. on medications and Pt. on home beta blockers +CHF  Normal cardiovascular exam  TTE 09/19/20: EF 30-35%, grade II DD, valves ok  Lexiscan 08/05/21: Abnormal, high risk stress nuclear study with thinning of the inferolateral wall versus prior infarct; no ischemia; gated ejection fraction 28% with global hypokinesis.  Severe left ventricular enlargement noted.  Study interpreted as high risk due to reduced LV function.     Neuro/Psych Anxiety negative neurological ROS     GI/Hepatic Neg liver ROS, GERD  ,History of Roux-en-Y    Endo/Other  diabetes, Type 2, Insulin Dependent, Oral Hypoglycemic AgentsHypothyroidism Morbid obesity  Renal/GU Renal InsufficiencyRenal disease (Cr 1.86)  negative genitourinary   Musculoskeletal  (+) Arthritis ,   Abdominal   Peds  Hematology negative hematology ROS (+)   Anesthesia Other Findings Day of surgery medications reviewed with patient.  Reproductive/Obstetrics negative OB ROS                            Anesthesia Physical Anesthesia Plan  ASA: 4  Anesthesia Plan: General   Post-op Pain Management: Tylenol PO (pre-op)*   Induction: Intravenous  PONV Risk Score and Plan: 2 and Midazolam, Dexamethasone, Ondansetron and Treatment may vary due to age or medical condition  Airway Management Planned: Oral ETT and Video Laryngoscope Planned  Additional Equipment:  ClearSight  Intra-op Plan:   Post-operative Plan: Extubation in OR  Informed Consent: I have reviewed the patients History and Physical, chart, labs and discussed the procedure including the risks, benefits and alternatives for the proposed anesthesia with the patient or authorized representative who has indicated his/her understanding and acceptance.     Dental advisory given  Plan Discussed with: CRNA  Anesthesia Plan Comments:       Anesthesia Quick Evaluation

## 2021-12-29 NOTE — Transfer of Care (Signed)
Immediate Anesthesia Transfer of Care Note  Patient: [name] Sosa  Procedure(s) Performed: RIGHT TOTAL HIP ARTHROPLASTY ANTERIOR APPROACH (Right: Hip)  Patient Location: PACU  Anesthesia Type:General  Level of Consciousness: awake, alert , oriented, drowsy and patient cooperative  Airway & Oxygen Therapy: Patient Spontanous Breathing and Patient connected to face mask oxygen  Post-op Assessment: Report given to RN and Post -op Vital signs reviewed and stable  Post vital signs: Reviewed and stable  Last Vitals:  Vitals Value Taken Time  BP 150/81 12/29/21 1725  Temp    Pulse 93 12/29/21 1728  Resp 20 12/29/21 1728  SpO2 100 % 12/29/21 1728  Vitals shown include unvalidated device data.  Last Pain:  Vitals:   12/29/21 1326  TempSrc: Oral  PainSc:       Patients Stated Pain Goal: 6 (69/50/72 2575)  Complications: No notable events documented.

## 2021-12-29 NOTE — Op Note (Signed)
PRE-OP DIAGNOSIS:  RIGHT HIP DEGENERATIVE JOINT DISEASE POST-OP DIAGNOSIS:  same PROCEDURE: RIGHT TOTAL HIP ARTHROPLASTY ANTERIOR APPROACH ANESTHESIA:  General SURGEON:  Melrose Nakayama MD ASSISTANT:  Loni Dolly PA-C   INDICATIONS FOR PROCEDURE:  The patient is a 61 y.o. male with a long history of a painful hip.  This has persisted despite multiple conservative measures.  The patient has persisted with pain and dysfunction making rest and activity difficult.  A total hip replacement is offered as surgical treatment.  Informed operative consent was obtained after discussion of possible complications including reaction to anesthesia, infection, neurovascular injury, dislocation, DVT, PE, and death.  The importance of the postoperative rehab program to optimize result was stressed with the patient.  SUMMARY OF FINDINGS AND PROCEDURE:  Under the above anesthesia through a anterior approach an the Hana table a right THR was performed.  The patient had severe degenerative change and good bone quality.  We used DePuy components to replace the hip and these were size 5 high offset Actis femur capped with a +8.5 49m ceramic hip ball.  On the acetabular side we used a size 52 Gription shell with a  plus 4 neutral polyethylene liner.  We did use a hole eliminator.  ALoni DollyPA-C assisted throughout and was invaluable to the completion of the case in that he helped position and retract while I performed the procedure.  He also closed simultaneously to help minimize OR time.  I used fluoroscopy throughout the case to check position of implants and leg lengths and read all of these views myself.  DESCRIPTION OF PROCEDURE:  The patient was taken to the OR suite where the above anesthetic was applied.  The patient was then positioned on the Hana table supine.  All bony prominences were appropriately padded.  Prep and drape was then performed in normal sterile fashion.  The patient was given kefzol preoperative  antibiotic and an appropriate time out was performed.  We then took an anterior approach to the right hip.  Dissection was taken through adipose to the tensor fascia lata fascia.  This structure was incised longitudinally and we dissected in the intermuscular interval just medial to this muscle.  Cobra retractors were placed superior and inferior to the femoral neck superficial to the capsule.  A capsular incision was then made and the retractors were placed along the femoral neck.  Xray was brought in to get a good level for the femoral neck cut which was made with an oscillating saw and osteotome.  The femoral head was removed with a corkscrew.  The acetabulum was exposed and some labral tissues were excised. Reaming was taken to the inside wall of the pelvis and sequentially up to 1 mm smaller than the actual component.  A trial of components was done and then the aforementioned acetabular shell was placed in appropriate tilt and anteversion confirmed by fluoroscopy. The liner was placed along with the hole eliminator and attention was turned to the femur.  The leg was brought down and over into adduction and the elevator bar was used to raise the femur up gently in the wound.  The piriformis was released with care taken to preserve the obturator internus attachment and all of the posterior capsule. The femur was reamed and then broached to the appropriate size.  A trial reduction was done and the aforementioned head and neck assembly gave uKoreathe best stability in extension with external rotation.  Leg lengths were felt to be about equal  by fluoroscopic exam.  The trial components were removed and the wound irrigated.  We then placed the femoral component in appropriate anteversion.  The head was applied to a dry stem neck and the hip again reduced.  It was again stable in the aforementioned position.  The would was irrigated again followed by re-approximation of anterior capsule with ethibond suture. Tensor  fascia was repaired with V-loc suture  followed by deep closure with #O and #2 undyed vicryl.  Skin was closed with subQ stitch and steristrips followed by a sterile dressing.  EBL and IOF can be obtained from anesthesia records.  DISPOSITION:  The patient was taken to PACU in stable condition to potentially go home same day depending on ability to walk and tolerate liquids.

## 2021-12-29 NOTE — Interval H&P Note (Signed)
History and Physical Interval Note:  12/29/2021 2:00 PM  [name] Sosa  has presented today for surgery, with the diagnosis of RIGHT HIP DEGENERATIVE JOINT DISEASE AND FEMORAL HEAD COLLAPSE.  The various methods of treatment have been discussed with the patient and family. After consideration of risks, benefits and other options for treatment, the patient has consented to  Procedure(s): RIGHT TOTAL HIP ARTHROPLASTY ANTERIOR APPROACH (Right) as a surgical intervention.  The patient's history has been reviewed, patient examined, no change in status, stable for surgery.  I have reviewed the patient's chart and labs.  Questions were answered to the patient's satisfaction.     [name] Sosa

## 2021-12-30 ENCOUNTER — Encounter (HOSPITAL_COMMUNITY): Payer: Self-pay | Admitting: Orthopaedic Surgery

## 2021-12-30 ENCOUNTER — Other Ambulatory Visit: Payer: Self-pay

## 2021-12-30 DIAGNOSIS — E1122 Type 2 diabetes mellitus with diabetic chronic kidney disease: Secondary | ICD-10-CM | POA: Diagnosis not present

## 2021-12-30 DIAGNOSIS — I5022 Chronic systolic (congestive) heart failure: Secondary | ICD-10-CM | POA: Diagnosis not present

## 2021-12-30 DIAGNOSIS — I130 Hypertensive heart and chronic kidney disease with heart failure and stage 1 through stage 4 chronic kidney disease, or unspecified chronic kidney disease: Secondary | ICD-10-CM | POA: Diagnosis not present

## 2021-12-30 DIAGNOSIS — G4733 Obstructive sleep apnea (adult) (pediatric): Secondary | ICD-10-CM | POA: Diagnosis not present

## 2021-12-30 DIAGNOSIS — E039 Hypothyroidism, unspecified: Secondary | ICD-10-CM | POA: Diagnosis not present

## 2021-12-30 DIAGNOSIS — Z8572 Personal history of non-Hodgkin lymphomas: Secondary | ICD-10-CM | POA: Diagnosis not present

## 2021-12-30 DIAGNOSIS — Z7982 Long term (current) use of aspirin: Secondary | ICD-10-CM | POA: Diagnosis not present

## 2021-12-30 DIAGNOSIS — M6281 Muscle weakness (generalized): Secondary | ICD-10-CM | POA: Diagnosis not present

## 2021-12-30 DIAGNOSIS — Z79899 Other long term (current) drug therapy: Secondary | ICD-10-CM | POA: Diagnosis not present

## 2021-12-30 DIAGNOSIS — N183 Chronic kidney disease, stage 3 unspecified: Secondary | ICD-10-CM | POA: Diagnosis not present

## 2021-12-30 DIAGNOSIS — Z794 Long term (current) use of insulin: Secondary | ICD-10-CM | POA: Diagnosis not present

## 2021-12-30 DIAGNOSIS — M1611 Unilateral primary osteoarthritis, right hip: Secondary | ICD-10-CM | POA: Diagnosis not present

## 2021-12-30 LAB — CBC
HCT: 36.3 % — ABNORMAL LOW (ref 39.0–52.0)
Hemoglobin: 11.5 g/dL — ABNORMAL LOW (ref 13.0–17.0)
MCH: 28.6 pg (ref 26.0–34.0)
MCHC: 31.7 g/dL (ref 30.0–36.0)
MCV: 90.3 fL (ref 80.0–100.0)
Platelets: 184 10*3/uL (ref 150–400)
RBC: 4.02 MIL/uL — ABNORMAL LOW (ref 4.22–5.81)
RDW: 14 % (ref 11.5–15.5)
WBC: 9.6 10*3/uL (ref 4.0–10.5)
nRBC: 0 % (ref 0.0–0.2)

## 2021-12-30 LAB — BASIC METABOLIC PANEL
Anion gap: 8 (ref 5–15)
BUN: 28 mg/dL — ABNORMAL HIGH (ref 8–23)
CO2: 21 mmol/L — ABNORMAL LOW (ref 22–32)
Calcium: 8.3 mg/dL — ABNORMAL LOW (ref 8.9–10.3)
Chloride: 104 mmol/L (ref 98–111)
Creatinine, Ser: 1.9 mg/dL — ABNORMAL HIGH (ref 0.61–1.24)
GFR, Estimated: 39 mL/min — ABNORMAL LOW (ref >60)
Glucose, Bld: 291 mg/dL — ABNORMAL HIGH (ref 70–99)
Potassium: 5.1 mmol/L (ref 3.5–5.1)
Sodium: 133 mmol/L — ABNORMAL LOW (ref 135–145)

## 2021-12-30 LAB — GLUCOSE, CAPILLARY
Glucose-Capillary: 215 mg/dL — ABNORMAL HIGH (ref 70–99)
Glucose-Capillary: 318 mg/dL — ABNORMAL HIGH (ref 70–99)

## 2021-12-30 MED ORDER — OXYCODONE HCL 5 MG PO TABS: 5.0000 mg | ORAL_TABLET | Freq: Four times a day (QID) | ORAL | 0 refills | Status: DC | PRN

## 2021-12-30 MED ORDER — TIZANIDINE HCL 4 MG PO TABS: 4.0000 mg | ORAL_TABLET | Freq: Four times a day (QID) | ORAL | 1 refills | Status: DC | PRN

## 2021-12-30 MED ORDER — ASPIRIN EC 81 MG PO TBEC: 81.0000 mg | DELAYED_RELEASE_TABLET | Freq: Two times a day (BID) | ORAL | 3 refills | Status: DC

## 2021-12-30 NOTE — Anesthesia Postprocedure Evaluation (Signed)
Anesthesia Post Note  Patient: SAAMIR ARMSTRONG  Procedure(s) Performed: RIGHT TOTAL HIP ARTHROPLASTY ANTERIOR APPROACH (Right: Hip)     Patient location during evaluation: PACU Anesthesia Type: General Level of consciousness: awake and alert Pain management: pain level controlled Vital Signs Assessment: post-procedure vital signs reviewed and stable Respiratory status: spontaneous breathing, nonlabored ventilation and respiratory function stable Cardiovascular status: blood pressure returned to baseline Postop Assessment: no apparent nausea or vomiting Anesthetic complications: no   No notable events documented.  Last Vitals:  Vitals:   12/30/21 0424 12/30/21 0934  BP: 132/89 (!) 153/66  Pulse: 83 90  Resp: 20 16  Temp: 36.6 C 36.7 C  SpO2: 99% 100%    Last Pain:  Vitals:   12/30/21 1131  TempSrc:   PainSc: 3     LLE Motor Response: Purposeful movement (12/30/21 1131) LLE Sensation: Full sensation (12/30/21 1131) RLE Motor Response: Purposeful movement (12/30/21 1131) RLE Sensation: Full sensation;Pain (12/30/21 1131) L Sensory Level: S1-Sole of foot, small toes (12/30/21 1131) R Sensory Level: S1-Sole of foot, small toes (12/30/21 1131)  Marthenia Rolling

## 2021-12-30 NOTE — Plan of Care (Signed)

## 2021-12-30 NOTE — Discharge Summary (Signed)
Patient ID: [name] Sosa MRN: [id number] DOB/AGE: [birth year] 61 y.o.  Admit date: 12/29/2021 Discharge date: 12/30/2021  Admission Diagnoses:  Principal Problem:   Primary localized osteoarthritis of right hip Active Problems:   Primary osteoarthritis of right hip   Discharge Diagnoses:  Same  Past Medical History:  Diagnosis Date   Anemia    Anxiety    HX OF ANXIETY ATTACKS--CAN'T SLEEP ON BACK-FEELS LIKE HE CAN'T BREATHE   Apnea, sleep    SEVERE OSA PER STUDY 12/01/12   Arthritis    Cancer ([place])    HX OF NON HODGKIN'S LYMPHOMA -IN REMISSION   Cardiomyopathy ([place]) 04/27/2018   Cardiomyopathy, nonischemic ([place])    DX 2003 CARDIAC CATH, ECHO 2006 SHOWED EF IMPROVED TO 45-50%--PER CARDIOLOGY OFFICE NOTES DR. Linard Millers FROM 01/20/10   Chronic kidney disease    HX OF CHRONIC KIDNEY DISEASE STAGE III - EVALUATED AT [facility] IN THE PAST AND FELT TO BE RELATED TO NSAID'S AND CHEMO.  PT  STATES HE HAS NOT SEEN KIDNEY SPECIALIST AT [facility] IN OVER A YEAR.   Chronic rhinitis 98/33/8250   Chronic systolic heart failure ([place])    CARDIOLOGIST IS DR. Linard Millers   CKD (chronic kidney disease), stage III ([place]) 12/08/2012   Claustrophobia    Complication of anesthesia    had muscle weakness and body aches the day after the surgery   Diabetic renal disease ([place]) 05/14/2020   Diffuse large cell lymphoma in remission ([place]) 04/27/2012   History of adenomatous polyp of colon 05/14/2020   History of Lap Roux-en-Y gastric bypass 03/05/13 03/23/2013   Hydrocele 05/14/2020   Hyperlipidemia    Hypertension    IDDM (insulin dependent diabetes mellitus) 12/08/2012   Morbid obesity ([place])    Multiple thyroid nodules    HX OF NODULES=PT STATES BIOPSY-TOLD ALL OKAY   Non-Hodgkin lymphoma ([place])    DX ABOUT 2007-TX'D WITH CHEMO-IN REMISSION   Nonischemic cardiomyopathy ([place]) 11/25/2017   Obstructive sleep apnea 12/08/2012   Sciatica, right side 05/14/2020   Skin sensation disturbance 05/14/2020    Subclinical hypothyroidism 05/14/2020   Testicular hypofunction 05/14/2020    Surgeries: Procedure(s): RIGHT TOTAL HIP ARTHROPLASTY ANTERIOR APPROACH on 12/29/2021   Consultants:   Discharged Condition: Improved  Hospital Course: [name] Sosa is an 61 y.o. male who was admitted 12/29/2021 for operative treatment ofPrimary localized osteoarthritis of right hip. Patient has severe unremitting pain that affects sleep, daily activities, and work/hobbies. After pre-op clearance the patient was taken to the operating room on 12/29/2021 and underwent  Procedure(s): RIGHT TOTAL HIP ARTHROPLASTY ANTERIOR APPROACH.    Patient was given perioperative antibiotics:  Anti-infectives (From admission, onward)    Start     Dose/Rate Route Frequency Ordered Stop   12/29/21 2000  ceFAZolin (ANCEF) IVPB 2g/100 mL premix        2 g 200 mL/hr over 30 Minutes Intravenous Every 6 hours 12/29/21 1955 12/30/21 0522   12/29/21 1315  ceFAZolin (ANCEF) IVPB 3g/100 mL premix        3 g 200 mL/hr over 30 Minutes Intravenous On call to O.R. 12/29/21 1302 12/29/21 1511        Patient was given sequential compression devices, early ambulation, and chemoprophylaxis to prevent DVT.  Patient benefited maximally from hospital stay and there were no complications.    Recent vital signs: Patient Vitals for the past 24 hrs:  BP Temp Temp src Pulse Resp SpO2 Height Weight  12/30/21 0424 132/89 97.8 F (36.6 C) --  83 20 99 % -- --  12/29/21 2336 134/71 97.8 F (36.6 C) Oral 88 15 100 % -- --  12/29/21 2130 -- -- -- 96 20 99 % -- --  12/29/21 2045 135/88 97.9 F (36.6 C) Oral 98 16 98 % -- --  12/29/21 2013 -- -- -- -- -- -- -- (!) 155.8 kg  12/29/21 1900 137/84 98.1 F (36.7 C) -- 95 18 99 % -- --  12/29/21 1830 (!) 148/83 -- -- 88 10 99 % -- --  12/29/21 1815 (!) 147/118 -- -- 82 20 98 % -- --  12/29/21 1800 133/71 -- -- 90 16 99 % -- --  12/29/21 1745 138/62 -- -- 87 20 100 % -- --  12/29/21 1730 (!)  136/96 -- -- 91 17 100 % -- --  12/29/21 1725 (!) 150/81 (!) 97 F (36.1 C) -- 89 16 100 % -- --  12/29/21 1326 (!) 156/96 98.2 F (36.8 C) Oral 83 15 100 % -- --  12/29/21 1316 -- -- -- -- -- -- 6' 1.5" (1.867 m) (!) 149.7 kg     Recent laboratory studies:  Recent Labs    12/30/21 0314  WBC 9.6  HGB 11.5*  HCT 36.3*  PLT 184  NA 133*  K 5.1  CL 104  CO2 21*  BUN 28*  CREATININE 1.90*  GLUCOSE 291*  CALCIUM 8.3*     Discharge Medications:   Allergies as of 12/30/2021       Reactions   Aciphex [rabeprazole Sodium]    Doesn't work    Acyclovir And Related    unkn   Nsaids    AVOIDS NSAID'S DUE TO HX OF CHRONIC KIDNEY DISEASE        Medication List     STOP taking these medications    ibuprofen 400 MG tablet Commonly known as: ADVIL       TAKE these medications    acetaminophen 650 MG CR tablet Commonly known as: TYLENOL Take 1,300 mg by mouth every 8 (eight) hours as needed for pain.   ALPRAZolam 0.5 MG tablet Commonly known as: XANAX Take 0.5 mg by mouth 2 (two) times daily as needed for anxiety.   aspirin EC 81 MG tablet Take 1 tablet (81 mg total) by mouth 2 (two) times daily after a meal. For 2 weeks then back to once a day for dvt prevention. What changed:  when to take this additional instructions   BARIATRIC MULTIVITAMINS/IRON PO Take 1 capsule by mouth daily.   CALCIUM CITRATE PO Take 2 tablets by mouth 2 (two) times daily.   carvedilol 25 MG tablet Commonly known as: COREG Take 1 tablet (25 mg total) by mouth 2 (two) times daily.   Entresto 49-51 MG Generic drug: sacubitril-valsartan Take 1 tablet by mouth 2 (two) times daily.   Farxiga 10 MG Tabs tablet Generic drug: dapagliflozin propanediol Take 10 mg by mouth daily.   HumaLOG KwikPen 200 UNIT/ML KwikPen Generic drug: insulin lispro Inject 20-25 Units into the skin 3 (three) times daily with meals. 20 units sq   NON FORMULARY Pt uses a cpap   oxyCODONE 5 MG  immediate release tablet Commonly known as: Oxy IR/ROXICODONE Take 1 tablet (5 mg total) by mouth every 6 (six) hours as needed for moderate pain or severe pain (post op pain). What changed: reasons to take this   Soliqua 100-33 UNT-MCG/ML Sopn Generic drug: Insulin Glargine-Lixisenatide Inject 30 Units into the skin at bedtime.  tiZANidine 4 MG tablet Commonly known as: Zanaflex Take 1 tablet (4 mg total) by mouth every 6 (six) hours as needed for muscle spasms.   traMADol 50 MG tablet Commonly known as: ULTRAM Take 100 mg by mouth every 12 (twelve) hours.   zolpidem 10 MG tablet Commonly known as: AMBIEN Take 10 mg by mouth at bedtime.               Durable Medical Equipment  (From admission, onward)           Start     Ordered   12/29/21 1956  DME Walker rolling  Once       Question:  Patient needs a walker to treat with the following condition  Answer:  Primary osteoarthritis of right hip   12/29/21 1955   12/29/21 1956  DME 3 n 1  Once        12/29/21 1955   12/29/21 1956  DME Bedside commode  Once       Question:  Patient needs a bedside commode to treat with the following condition  Answer:  Primary osteoarthritis of right hip   12/29/21 1955            Diagnostic Studies: DG HIP PORT UNILAT WITH PELVIS 1V RIGHT  Result Date: 12/29/2021 CLINICAL DATA:  Intraop. EXAM: DG HIP (WITH OR WITHOUT PELVIS) 1V PORT RIGHT COMPARISON:  None Available. FINDINGS: Thirteen fluoroscopic spot views obtained in the operating room during right hip arthroplasty. Scabby time 21 seconds. Dose 7.58 mGy. IMPRESSION: Fluoroscopic spot views during right hip arthroplasty. Electronically Signed   By: Keith Rake M.D.   On: 12/29/2021 17:20   DG C-Arm 1-60 Min-No Report  Result Date: 12/29/2021 Fluoroscopy was utilized by the requesting physician.  No radiographic interpretation.   DG C-Arm 1-60 Min-No Report  Result Date: 12/29/2021 Fluoroscopy was utilized by the  requesting physician.  No radiographic interpretation.    Disposition: Discharge disposition: 01-Home or Self Care       Discharge Instructions     Call MD / Call 911   Complete by: As directed    If you experience chest pain or shortness of breath, CALL 911 and be transported to the hospital emergency room.  If you develope a fever above 101 F, pus (white drainage) or increased drainage or redness at the wound, or calf pain, call your surgeon's office.   Constipation Prevention   Complete by: As directed    Drink plenty of fluids.  Prune juice may be helpful.  You may use a stool softener, such as Colace (over the counter) 100 mg twice a day.  Use MiraLax (over the counter) for constipation as needed.   Diet - low sodium heart healthy   Complete by: As directed    Discharge instructions   Complete by: As directed    INSTRUCTIONS AFTER JOINT REPLACEMENT   Remove items at home which could result in a fall. This includes throw rugs or furniture in walking pathways ICE to the affected joint every three hours while awake for 30 minutes at a time, for at least the first 3-5 days, and then as needed for pain and swelling.  Continue to use ice for pain and swelling. You may notice swelling that will progress down to the foot and ankle.  This is normal after surgery.  Elevate your leg when you are not up walking on it.   Continue to use the breathing machine you got in the hospital (  incentive spirometer) which will help keep your temperature down.  It is common for your temperature to cycle up and down following surgery, especially at night when you are not up moving around and exerting yourself.  The breathing machine keeps your lungs expanded and your temperature down.   DIET:  As you were doing prior to hospitalization, we recommend a well-balanced diet.  DRESSING / WOUND CARE / SHOWERING  You may shower 3 days after surgery, but keep the wounds dry during showering.  You may use an  occlusive plastic wrap (Press'n Seal for example), NO SOAKING/SUBMERGING IN THE BATHTUB.  If the bandage gets wet, change with a clean dry gauze.  If the incision gets wet, pat the wound dry with a clean towel.  ACTIVITY  Increase activity slowly as tolerated, but follow the weight bearing instructions below.   No driving for 6 weeks or until further direction given by your physician.  You cannot drive while taking narcotics.  No lifting or carrying greater than 10 lbs. until further directed by your surgeon. Avoid periods of inactivity such as sitting longer than an hour when not asleep. This helps prevent blood clots.  You may return to work once you are authorized by your doctor.     WEIGHT BEARING   Weight bearing as tolerated with assist device (walker, cane, etc) as directed, use it as long as suggested by your surgeon or therapist, typically at least 4-6 weeks.   EXERCISES  Results after joint replacement surgery are often greatly improved when you follow the exercise, range of motion and muscle strengthening exercises prescribed by your doctor. Safety measures are also important to protect the joint from further injury. Any time any of these exercises cause you to have increased pain or swelling, decrease what you are doing until you are comfortable again and then slowly increase them. If you have problems or questions, call your caregiver or physical therapist for advice.   Rehabilitation is important following a joint replacement. After just a few days of immobilization, the muscles of the leg can become weakened and shrink (atrophy).  These exercises are designed to build up the tone and strength of the thigh and leg muscles and to improve motion. Often times heat used for twenty to thirty minutes before working out will loosen up your tissues and help with improving the range of motion but do not use heat for the first two weeks following surgery (sometimes heat can increase  post-operative swelling).   These exercises can be done on a training (exercise) mat, on the floor, on a table or on a bed. Use whatever works the best and is most comfortable for you.    Use music or television while you are exercising so that the exercises are a pleasant break in your day. This will make your life better with the exercises acting as a break in your routine that you can look forward to.   Perform all exercises about fifteen times, three times per day or as directed.  You should exercise both the operative leg and the other leg as well.  Exercises include:   Quad Sets - Tighten up the muscle on the front of the thigh (Quad) and hold for 5-10 seconds.   Straight Leg Raises - With your knee straight (if you were given a brace, keep it on), lift the leg to 60 degrees, hold for 3 seconds, and slowly lower the leg.  Perform this exercise against resistance later as your  leg gets stronger.  Leg Slides: Lying on your back, slowly slide your foot toward your buttocks, bending your knee up off the floor (only go as far as is comfortable). Then slowly slide your foot back down until your leg is flat on the floor again.  Angel Wings: Lying on your back spread your legs to the side as far apart as you can without causing discomfort.  Hamstring Strength:  Lying on your back, push your heel against the floor with your leg straight by tightening up the muscles of your buttocks.  Repeat, but this time bend your knee to a comfortable angle, and push your heel against the floor.  You may put a pillow under the heel to make it more comfortable if necessary.   A rehabilitation program following joint replacement surgery can speed recovery and prevent re-injury in the future due to weakened muscles. Contact your doctor or a physical therapist for more information on knee rehabilitation.    CONSTIPATION  Constipation is defined medically as fewer than three stools per week and severe constipation as less  than one stool per week.  Even if you have a regular bowel pattern at home, your normal regimen is likely to be disrupted due to multiple reasons following surgery.  Combination of anesthesia, postoperative narcotics, change in appetite and fluid intake all can affect your bowels.   YOU MUST use at least one of the following options; they are listed in order of increasing strength to get the job done.  They are all available over the counter, and you may need to use some, POSSIBLY even all of these options:    Drink plenty of fluids (prune juice may be helpful) and high fiber foods Colace 100 mg by mouth twice a day  Senokot for constipation as directed and as needed Dulcolax (bisacodyl), take with full glass of water  Miralax (polyethylene glycol) once or twice a day as needed.  If you have tried all these things and are unable to have a bowel movement in the first 3-4 days after surgery call either your surgeon or your primary doctor.    If you experience loose stools or diarrhea, hold the medications until you stool forms back up.  If your symptoms do not get better within 1 week or if they get worse, check with your doctor.  If you experience "the worst abdominal pain ever" or develop nausea or vomiting, please contact the office immediately for further recommendations for treatment.   ITCHING:  If you experience itching with your medications, try taking only a single pain pill, or even half a pain pill at a time.  You can also use Benadryl over the counter for itching or also to help with sleep.   TED HOSE STOCKINGS:  Use stockings on both legs until for at least 2 weeks or as directed by physician office. They may be removed at night for sleeping.  MEDICATIONS:  See your medication summary on the "After Visit Summary" that nursing will review with you.  You may have some home medications which will be placed on hold until you complete the course of blood thinner medication.  It is important  for you to complete the blood thinner medication as prescribed.  PRECAUTIONS:  If you experience chest pain or shortness of breath - call 911 immediately for transfer to the hospital emergency department.   If you develop a fever greater that 101 F, purulent drainage from wound, increased redness or drainage from wound,  foul odor from the wound/dressing, or calf pain - CONTACT YOUR SURGEON.                                                   FOLLOW-UP APPOINTMENTS:  If you do not already have a post-op appointment, please call the office for an appointment to be seen by your surgeon.  Guidelines for how soon to be seen are listed in your "After Visit Summary", but are typically between 1-4 weeks after surgery.  OTHER INSTRUCTIONS:   Knee Replacement:  Do not place pillow under knee, focus on keeping the knee straight while resting. CPM instructions: 0-90 degrees, 2 hours in the morning, 2 hours in the afternoon, and 2 hours in the evening. Place foam block, curve side up under heel at all times except when in CPM or when walking.  DO NOT modify, tear, cut, or change the foam block in any way.  POST-OPERATIVE OPIOID TAPER INSTRUCTIONS: It is important to wean off of your opioid medication as soon as possible. If you do not need pain medication after your surgery it is ok to stop day one. Opioids include: Codeine, Hydrocodone(Norco, Vicodin), Oxycodone(Percocet, oxycontin) and hydromorphone amongst others.  Long term and even short term use of opiods can cause: Increased pain response Dependence Constipation Depression Respiratory depression And more.  Withdrawal symptoms can include Flu like symptoms Nausea, vomiting And more Techniques to manage these symptoms Hydrate well Eat regular healthy meals Stay active Use relaxation techniques(deep breathing, meditating, yoga) Do Not substitute Alcohol to help with tapering If you have been on opioids for less than two weeks and do not have  pain than it is ok to stop all together.  Plan to wean off of opioids This plan should start within one week post op of your joint replacement. Maintain the same interval or time between taking each dose and first decrease the dose.  Cut the total daily intake of opioids by one tablet each day Next start to increase the time between doses. The last dose that should be eliminated is the evening dose.     MAKE SURE YOU:  Understand these instructions.  Get help right away if you are not doing well or get worse.    Thank you for letting us be a part of your medical care team.  It is a privilege we respect greatly.  We hope these instructions will help you stay on track for a fast and full recovery!   Increase activity slowly as tolerated   Complete by: As directed    Post-operative opioid taper instructions:   Complete by: As directed    POST-OPERATIVE OPIOID TAPER INSTRUCTIONS: It is important to wean off of your opioid medication as soon as possible. If you do not need pain medication after your surgery it is ok to stop day one. Opioids include: Codeine, Hydrocodone(Norco, Vicodin), Oxycodone(Percocet, oxycontin) and hydromorphone amongst others.  Long term and even short term use of opiods can cause: Increased pain response Dependence Constipation Depression Respiratory depression And more.  Withdrawal symptoms can include Flu like symptoms Nausea, vomiting And more Techniques to manage these symptoms Hydrate well Eat regular healthy meals Stay active Use relaxation techniques(deep breathing, meditating, yoga) Do Not substitute Alcohol to help with tapering If you have been on opioids for less than two weeks and do not  have pain than it is ok to stop all together.  Plan to wean off of opioids This plan should start within one week post op of your joint replacement. Maintain the same interval or time between taking each dose and first decrease the dose.  Cut the total  daily intake of opioids by one tablet each day Next start to increase the time between doses. The last dose that should be eliminated is the evening dose.           Follow-up Information     Melrose Nakayama, MD. Schedule an appointment as soon as possible for a visit in 2 week(s).   Specialty: Orthopedic Surgery Contact information: [place] [address] [phone]                  Signed: Larwance Sachs Yarnell Arvidson 12/30/2021, 8:22 AM

## 2021-12-30 NOTE — TOC Transition Note (Signed)
Transition of Care [facility]) - CM/SW Discharge Note   Patient Details  Name: [name] Sosa MRN: [id number] Date of Birth: 07-15-1960  [facility]) CM/SW Contact:  Lennart Pall, LCSW Phone Number: 12/30/2021, 10:23 AM   Clinical Narrative:    Met with pt and confirming need for RW (bariatric) and no DME agency preference.  Order placed with [facility] for delivery to room today.  HHPT prearranged with [facility].  No further TOC needs.   Final next level of care: [place] Barriers to Discharge: No Barriers Identified   Patient Goals and CMS Choice Patient states their goals for this hospitalization and ongoing recovery are:: return home      Discharge Placement                       Discharge Plan and Services                DME Arranged: [facility]ford Rile wide (Bariatric rolling walker) DME Agency: [employer] Date DME Agency Contacted: 12/30/21 Time DME Agency Contacted: [phone] Representative spoke with at DME Agency: [place]: PT [place]: [place]        Social Determinants of Health (SDOH) Interventions     Readmission Risk Interventions     No data to display

## 2021-12-30 NOTE — Progress Notes (Signed)
Physical Therapy Treatment Patient Details Name: [name] Sosa MRN: [id number] DOB: Jul 26, 1960 Today's Date: 12/30/2021   History of Present Illness Pt is a 61 year old male s/p R THA on 12/29/21.    PT Comments    Pt ambulated in hallway and practiced safe stair technique.  Pt provided with HEP handout.  Pt had no further questions and feels ready for d/c home today.    Recommendations for follow up therapy are one component of a multi-disciplinary discharge planning process, led by the attending physician.  Recommendations may be updated based on patient status, additional functional criteria and insurance authorization.  Follow Up Recommendations  Follow physician's recommendations for discharge plan and follow up therapies     Assistance Recommended at Discharge    Patient can return home with the following Help with stairs or ramp for entrance   Equipment Recommendations  Rolling walker (2 wheels)    Recommendations for Other Services       Precautions / Restrictions Precautions Precautions: Fall Restrictions RLE Weight Bearing: Weight bearing as tolerated     Mobility  Bed Mobility               General bed mobility comments: pt in recliner    Transfers Overall transfer level: Needs assistance Equipment used: Rolling walker (2 wheels) Transfers: Sit to/from Stand Sit to Stand: Min guard, Supervision           General transfer comment: verbal cues for UE and LE positioning    Ambulation/Gait Ambulation/Gait assistance: Min guard, Supervision Gait Distance (Feet): 400 Feet Assistive device: Rolling walker (2 wheels) Gait Pattern/deviations: Step-through pattern, Decreased stride length       General Gait Details: verbal cues for sequence, RW positioning, posture, step length   Stairs Stairs: Yes Stairs assistance: Min guard Stair Management: Step to pattern, Forwards, One rail Right Number of Stairs: 2 General stair comments: verbal  cues for safety and sequence; pt reports understanding and states he has been performing this way at home   Wheelchair Mobility    Modified Rankin (Stroke Patients Only)       Balance                                            Cognition Arousal/Alertness: Awake/alert Behavior During Therapy: WFL for tasks assessed/performed Overall Cognitive Status: Within Functional Limits for tasks assessed                                          Exercises     General Comments        Pertinent Vitals/Pain Pain Assessment Pain Assessment: 0-10 Pain Score: 5  Pain Location: right hip Pain Descriptors / Indicators: Aching, Sore Pain Intervention(s): Repositioned, Monitored during session    Home Living                          Prior Function            PT Goals (current goals can now be found in the care plan section) Progress towards PT goals: Progressing toward goals    Frequency    7X/week      PT Plan Current plan remains appropriate    Co-evaluation  AM-PAC PT "6 Clicks" Mobility   Outcome Measure  Help needed turning from your back to your side while in a flat bed without using bedrails?: A Little Help needed moving from lying on your back to sitting on the side of a flat bed without using bedrails?: A Little Help needed moving to and from a bed to a chair (including a wheelchair)?: A Little Help needed standing up from a chair using your arms (e.g., wheelchair or bedside chair)?: A Little Help needed to walk in hospital room?: A Little Help needed climbing 3-5 steps with a railing? : A Little 6 Click Score: 18    End of Session Equipment Utilized During Treatment: Gait belt Activity Tolerance: Patient tolerated treatment well Patient left: in chair;with call bell/phone within reach;with chair alarm set Nurse Communication: Mobility status PT Visit Diagnosis: Difficulty in walking, not  elsewhere classified (R26.2)     Time: 1244-1300 PT Time Calculation (min) (ACUTE ONLY): 16 min  Charges:  $Gait Training: 8-22 mins                     Jannette Spanner PT, DPT Physical Therapist Acute Rehabilitation Services Preferred contact method: Secure Chat Weekend Pager Only: [phone] Office: [phone]  Myrtis Hopping [place] 12/30/2021, 3:44 PM

## 2021-12-30 NOTE — Evaluation (Signed)
Physical Therapy Evaluation Patient Details Name: [name] Sosa MRN: [id number] DOB: Jun 02, 1960 Today's Date: 12/30/2021  History of Present Illness  Pt is a 61 year old male s/p R THA on 12/29/21.  Clinical Impression  Pt is s/p THA resulting in the deficits listed below (see PT Problem List).  Pt will benefit from skilled PT to increase their independence and safety with mobility to allow discharge to the venue listed below.  Pt ambulated in hallway and performed LE exercises.  Pt anticipates d/c home later today after practicing stairs.        Recommendations for follow up therapy are one component of a multi-disciplinary discharge planning process, led by the attending physician.  Recommendations may be updated based on patient status, additional functional criteria and insurance authorization.  Follow Up Recommendations Follow physician's recommendations for discharge plan and follow up therapies      Assistance Recommended at Discharge    Patient can return home with the following  Help with stairs or ramp for entrance    Equipment Recommendations Rolling walker (2 wheels)  Recommendations for Other Services       Functional Status Assessment Patient has had a recent decline in their functional status and demonstrates the ability to make significant improvements in function in a reasonable and predictable amount of time.     Precautions / Restrictions Precautions Precautions: Fall Restrictions Weight Bearing Restrictions: No RLE Weight Bearing: Weight bearing as tolerated      Mobility  Bed Mobility               General bed mobility comments: pt sitting EOB on arrival    Transfers Overall transfer level: Needs assistance Equipment used: Rolling walker (2 wheels) Transfers: Sit to/from Stand Sit to Stand: Min guard, From elevated surface           General transfer comment: verbal cues for UE and LE positioning     Ambulation/Gait Ambulation/Gait assistance: Min guard Gait Distance (Feet): 200 Feet Assistive device: Rolling walker (2 wheels) Gait Pattern/deviations: Step-through pattern, Decreased stride length       General Gait Details: verbal cues for sequence, RW positioning, posture, step length  Stairs            Wheelchair Mobility    Modified Rankin (Stroke Patients Only)       Balance                                             Pertinent Vitals/Pain Pain Assessment Pain Assessment: 0-10 Pain Score: 4  Pain Location: right hip Pain Descriptors / Indicators: Aching, Sore Pain Intervention(s): Repositioned, Monitored during session, Premedicated before session    Home Living Family/patient expects to be discharged to:: Private residence Living Arrangements: Alone Available Help at Discharge: Available PRN/intermittently;Friend(s);Family Type of Home: House Home Access: Stairs to enter Entrance Stairs-Rails: Psychiatric nurse of Steps: 3   Home Layout: One level Home Equipment: Cane - single point      Prior Function Prior Level of Function : Independent/Modified Independent                     Hand Dominance        Extremity/Trunk Assessment        Lower Extremity Assessment Lower Extremity Assessment: RLE deficits/detail RLE Deficits / Details: anticipated post op hip weakness, grossly 2+/5 hip  strength       Communication   Communication: No difficulties  Cognition Arousal/Alertness: Awake/alert Behavior During Therapy: WFL for tasks assessed/performed Overall Cognitive Status: Within Functional Limits for tasks assessed                                          General Comments      Exercises Total Joint Exercises Ankle Circles/Pumps: AROM, Both, 10 reps Quad Sets: AROM, Both, 10 reps Heel Slides: AAROM, Right, 10 reps Hip ABduction/ADduction: AAROM, Right, 10 reps, AROM,  Standing, Supine Long Arc Quad: AROM, Seated, Right, 10 reps Knee Flexion: AROM, Right, Standing, 10 reps Marching in Standing: AROM, Right, Standing, 10 reps   Assessment/Plan    PT Assessment Patient needs continued PT services  PT Problem List Decreased strength;Decreased balance;Decreased mobility;Pain;Obesity       PT Treatment Interventions Stair training;Gait training;DME instruction;Therapeutic exercise;Therapeutic activities;Functional mobility training;Patient/family education    PT Goals (Current goals can be found in the Care Plan section)  Acute Rehab PT Goals PT Goal Formulation: With patient Time For Goal Achievement: 01/06/22 Potential to Achieve Goals: Good    Frequency 7X/week     Co-evaluation               AM-PAC PT "6 Clicks" Mobility  Outcome Measure Help needed turning from your back to your side while in a flat bed without using bedrails?: A Little Help needed moving from lying on your back to sitting on the side of a flat bed without using bedrails?: A Little Help needed moving to and from a bed to a chair (including a wheelchair)?: A Little Help needed standing up from a chair using your arms (e.g., wheelchair or bedside chair)?: A Little Help needed to walk in hospital room?: A Little Help needed climbing 3-5 steps with a railing? : A Little 6 Click Score: 18    End of Session Equipment Utilized During Treatment: Gait belt Activity Tolerance: Patient tolerated treatment well Patient left: in chair;with call bell/phone within reach;with chair alarm set   PT Visit Diagnosis: Difficulty in walking, not elsewhere classified (R26.2)    Time: 4944-9675 PT Time Calculation (min) (ACUTE ONLY): 18 min   Charges:   PT Evaluation $PT Eval Low Complexity: 1 Low         Kati PT, DPT Physical Therapist Acute Rehabilitation Services Preferred contact method: Secure Chat Weekend Pager Only: [phone] Office: [place] 12/30/2021, 11:44 AM

## 2021-12-30 NOTE — Progress Notes (Signed)
Patients stood up and took a few steps at the side of bed.

## 2021-12-30 NOTE — Progress Notes (Signed)
Provided discharge education/instructions, all questions and concerns addressed. Pt not in acute distress, discharged home with RW and belongings accompanied by family.

## 2021-12-30 NOTE — Progress Notes (Signed)
Subjective: 1 Day Post-Op Procedure(s) (LRB): RIGHT TOTAL HIP ARTHROPLASTY ANTERIOR APPROACH (Right)  Patient feels great this morning. His pain is already better than it was prior to surgery.  Activity level:  wbat Diet tolerance:  ok Voiding:  ok Patient reports pain as mild.    Objective: Vital signs in last 24 hours: Temp:  [97 F (36.1 C)-98.2 F (36.8 C)] 97.8 F (36.6 C) (10/11 0424) Pulse Rate:  [82-98] 83 (10/11 0424) Resp:  [10-20] 20 (10/11 0424) BP: (132-156)/(62-118) 132/89 (10/11 0424) SpO2:  [98 %-100 %] 99 % (10/11 0424) Weight:  [149.7 kg-155.8 kg] 155.8 kg (10/10 2013)  Labs: Recent Labs    12/30/21 0314  HGB 11.5*   Recent Labs    12/30/21 0314  WBC 9.6  RBC 4.02*  HCT 36.3*  PLT 184   Recent Labs    12/30/21 0314  NA 133*  K 5.1  CL 104  CO2 21*  BUN 28*  CREATININE 1.90*  GLUCOSE 291*  CALCIUM 8.3*   No results for input(s): "LABPT", "INR" in the last 72 hours.  Physical Exam:  Neurologically intact ABD soft Neurovascular intact Sensation intact distally Intact pulses distally Dorsiflexion/Plantar flexion intact Incision: dressing C/D/I and no drainage No cellulitis present Compartment soft  Assessment/Plan:  1 Day Post-Op Procedure(s) (LRB): RIGHT TOTAL HIP ARTHROPLASTY ANTERIOR APPROACH (Right) Advance diet Up with therapy D/C IV fluids Discharge home with home health today after PT if cleared and doing well. Continue on '81mg'$  asa BID for dvt prevention. Follow up in office 2 weeks post op.    Larwance Sachs Sherrel Ploch 12/30/2021, 8:17 AM

## 2021-12-31 DIAGNOSIS — Z794 Long term (current) use of insulin: Secondary | ICD-10-CM | POA: Diagnosis not present

## 2021-12-31 DIAGNOSIS — Z471 Aftercare following joint replacement surgery: Secondary | ICD-10-CM | POA: Diagnosis not present

## 2021-12-31 DIAGNOSIS — D631 Anemia in chronic kidney disease: Secondary | ICD-10-CM | POA: Diagnosis not present

## 2021-12-31 DIAGNOSIS — G4733 Obstructive sleep apnea (adult) (pediatric): Secondary | ICD-10-CM | POA: Diagnosis not present

## 2021-12-31 DIAGNOSIS — I5022 Chronic systolic (congestive) heart failure: Secondary | ICD-10-CM | POA: Diagnosis not present

## 2021-12-31 DIAGNOSIS — M5431 Sciatica, right side: Secondary | ICD-10-CM | POA: Diagnosis not present

## 2021-12-31 DIAGNOSIS — E042 Nontoxic multinodular goiter: Secondary | ICD-10-CM | POA: Diagnosis not present

## 2021-12-31 DIAGNOSIS — N183 Chronic kidney disease, stage 3 unspecified: Secondary | ICD-10-CM | POA: Diagnosis not present

## 2021-12-31 DIAGNOSIS — K219 Gastro-esophageal reflux disease without esophagitis: Secondary | ICD-10-CM | POA: Diagnosis not present

## 2021-12-31 DIAGNOSIS — I130 Hypertensive heart and chronic kidney disease with heart failure and stage 1 through stage 4 chronic kidney disease, or unspecified chronic kidney disease: Secondary | ICD-10-CM | POA: Diagnosis not present

## 2021-12-31 DIAGNOSIS — F32A Depression, unspecified: Secondary | ICD-10-CM | POA: Diagnosis not present

## 2021-12-31 DIAGNOSIS — E785 Hyperlipidemia, unspecified: Secondary | ICD-10-CM | POA: Diagnosis not present

## 2021-12-31 DIAGNOSIS — E038 Other specified hypothyroidism: Secondary | ICD-10-CM | POA: Diagnosis not present

## 2021-12-31 DIAGNOSIS — F419 Anxiety disorder, unspecified: Secondary | ICD-10-CM | POA: Diagnosis not present

## 2021-12-31 DIAGNOSIS — J310 Chronic rhinitis: Secondary | ICD-10-CM | POA: Diagnosis not present

## 2022-01-02 DIAGNOSIS — E785 Hyperlipidemia, unspecified: Secondary | ICD-10-CM | POA: Diagnosis not present

## 2022-01-02 DIAGNOSIS — J310 Chronic rhinitis: Secondary | ICD-10-CM | POA: Diagnosis not present

## 2022-01-02 DIAGNOSIS — M5431 Sciatica, right side: Secondary | ICD-10-CM | POA: Diagnosis not present

## 2022-01-02 DIAGNOSIS — I5022 Chronic systolic (congestive) heart failure: Secondary | ICD-10-CM | POA: Diagnosis not present

## 2022-01-02 DIAGNOSIS — F419 Anxiety disorder, unspecified: Secondary | ICD-10-CM | POA: Diagnosis not present

## 2022-01-02 DIAGNOSIS — E038 Other specified hypothyroidism: Secondary | ICD-10-CM | POA: Diagnosis not present

## 2022-01-02 DIAGNOSIS — K219 Gastro-esophageal reflux disease without esophagitis: Secondary | ICD-10-CM | POA: Diagnosis not present

## 2022-01-02 DIAGNOSIS — E042 Nontoxic multinodular goiter: Secondary | ICD-10-CM | POA: Diagnosis not present

## 2022-01-02 DIAGNOSIS — N183 Chronic kidney disease, stage 3 unspecified: Secondary | ICD-10-CM | POA: Diagnosis not present

## 2022-01-02 DIAGNOSIS — I130 Hypertensive heart and chronic kidney disease with heart failure and stage 1 through stage 4 chronic kidney disease, or unspecified chronic kidney disease: Secondary | ICD-10-CM | POA: Diagnosis not present

## 2022-01-02 DIAGNOSIS — Z471 Aftercare following joint replacement surgery: Secondary | ICD-10-CM | POA: Diagnosis not present

## 2022-01-02 DIAGNOSIS — D631 Anemia in chronic kidney disease: Secondary | ICD-10-CM | POA: Diagnosis not present

## 2022-01-02 DIAGNOSIS — G4733 Obstructive sleep apnea (adult) (pediatric): Secondary | ICD-10-CM | POA: Diagnosis not present

## 2022-01-02 DIAGNOSIS — F32A Depression, unspecified: Secondary | ICD-10-CM | POA: Diagnosis not present

## 2022-01-02 DIAGNOSIS — Z794 Long term (current) use of insulin: Secondary | ICD-10-CM | POA: Diagnosis not present

## 2022-01-04 DIAGNOSIS — Z471 Aftercare following joint replacement surgery: Secondary | ICD-10-CM | POA: Diagnosis not present

## 2022-01-04 DIAGNOSIS — E038 Other specified hypothyroidism: Secondary | ICD-10-CM | POA: Diagnosis not present

## 2022-01-04 DIAGNOSIS — F419 Anxiety disorder, unspecified: Secondary | ICD-10-CM | POA: Diagnosis not present

## 2022-01-04 DIAGNOSIS — M5431 Sciatica, right side: Secondary | ICD-10-CM | POA: Diagnosis not present

## 2022-01-04 DIAGNOSIS — E785 Hyperlipidemia, unspecified: Secondary | ICD-10-CM | POA: Diagnosis not present

## 2022-01-04 DIAGNOSIS — I5022 Chronic systolic (congestive) heart failure: Secondary | ICD-10-CM | POA: Diagnosis not present

## 2022-01-04 DIAGNOSIS — I130 Hypertensive heart and chronic kidney disease with heart failure and stage 1 through stage 4 chronic kidney disease, or unspecified chronic kidney disease: Secondary | ICD-10-CM | POA: Diagnosis not present

## 2022-01-04 DIAGNOSIS — J310 Chronic rhinitis: Secondary | ICD-10-CM | POA: Diagnosis not present

## 2022-01-04 DIAGNOSIS — D631 Anemia in chronic kidney disease: Secondary | ICD-10-CM | POA: Diagnosis not present

## 2022-01-04 DIAGNOSIS — E042 Nontoxic multinodular goiter: Secondary | ICD-10-CM | POA: Diagnosis not present

## 2022-01-04 DIAGNOSIS — K219 Gastro-esophageal reflux disease without esophagitis: Secondary | ICD-10-CM | POA: Diagnosis not present

## 2022-01-04 DIAGNOSIS — F32A Depression, unspecified: Secondary | ICD-10-CM | POA: Diagnosis not present

## 2022-01-04 DIAGNOSIS — G4733 Obstructive sleep apnea (adult) (pediatric): Secondary | ICD-10-CM | POA: Diagnosis not present

## 2022-01-04 DIAGNOSIS — N183 Chronic kidney disease, stage 3 unspecified: Secondary | ICD-10-CM | POA: Diagnosis not present

## 2022-01-04 DIAGNOSIS — Z794 Long term (current) use of insulin: Secondary | ICD-10-CM | POA: Diagnosis not present

## 2022-01-05 ENCOUNTER — Other Ambulatory Visit: Payer: Self-pay

## 2022-01-05 ENCOUNTER — Telehealth: Payer: Self-pay | Admitting: Cardiology

## 2022-01-05 DIAGNOSIS — Z794 Long term (current) use of insulin: Secondary | ICD-10-CM | POA: Diagnosis not present

## 2022-01-05 DIAGNOSIS — M5431 Sciatica, right side: Secondary | ICD-10-CM | POA: Diagnosis not present

## 2022-01-05 DIAGNOSIS — F32A Depression, unspecified: Secondary | ICD-10-CM | POA: Diagnosis not present

## 2022-01-05 DIAGNOSIS — J310 Chronic rhinitis: Secondary | ICD-10-CM | POA: Diagnosis not present

## 2022-01-05 DIAGNOSIS — Z471 Aftercare following joint replacement surgery: Secondary | ICD-10-CM | POA: Diagnosis not present

## 2022-01-05 DIAGNOSIS — I5022 Chronic systolic (congestive) heart failure: Secondary | ICD-10-CM | POA: Diagnosis not present

## 2022-01-05 DIAGNOSIS — K219 Gastro-esophageal reflux disease without esophagitis: Secondary | ICD-10-CM | POA: Diagnosis not present

## 2022-01-05 DIAGNOSIS — E785 Hyperlipidemia, unspecified: Secondary | ICD-10-CM | POA: Diagnosis not present

## 2022-01-05 DIAGNOSIS — E038 Other specified hypothyroidism: Secondary | ICD-10-CM | POA: Diagnosis not present

## 2022-01-05 DIAGNOSIS — G4733 Obstructive sleep apnea (adult) (pediatric): Secondary | ICD-10-CM | POA: Diagnosis not present

## 2022-01-05 DIAGNOSIS — F419 Anxiety disorder, unspecified: Secondary | ICD-10-CM | POA: Diagnosis not present

## 2022-01-05 DIAGNOSIS — E042 Nontoxic multinodular goiter: Secondary | ICD-10-CM | POA: Diagnosis not present

## 2022-01-05 DIAGNOSIS — I130 Hypertensive heart and chronic kidney disease with heart failure and stage 1 through stage 4 chronic kidney disease, or unspecified chronic kidney disease: Secondary | ICD-10-CM | POA: Diagnosis not present

## 2022-01-05 DIAGNOSIS — N183 Chronic kidney disease, stage 3 unspecified: Secondary | ICD-10-CM | POA: Diagnosis not present

## 2022-01-05 DIAGNOSIS — D631 Anemia in chronic kidney disease: Secondary | ICD-10-CM | POA: Diagnosis not present

## 2022-01-05 MED ORDER — CARVEDILOL 25 MG PO TABS: 25.0000 mg | ORAL_TABLET | Freq: Two times a day (BID) | ORAL | 3 refills | Status: DC

## 2022-01-05 NOTE — Telephone Encounter (Signed)
Coreg '25mg'$  BID #180 ref x 3 sent to [facility]

## 2022-01-05 NOTE — Telephone Encounter (Signed)
*  STAT* If patient is at the pharmacy, call can be transferred to refill team.   1. Which medications need to be refilled? (please list name of each medication and dose if known)   carvedilol (COREG) 25 MG tablet    2. Which pharmacy/location (including street and city if local pharmacy) is medication to be sent to? [id number] [place] [place] [facility] [address]  3. Do they need a 30 day or 90 day supply?  90 day

## 2022-01-08 DIAGNOSIS — Z471 Aftercare following joint replacement surgery: Secondary | ICD-10-CM | POA: Diagnosis not present

## 2022-01-08 DIAGNOSIS — Z96641 Presence of right artificial hip joint: Secondary | ICD-10-CM | POA: Diagnosis not present

## 2022-01-11 DIAGNOSIS — G4733 Obstructive sleep apnea (adult) (pediatric): Secondary | ICD-10-CM | POA: Diagnosis not present

## 2022-01-11 DIAGNOSIS — E038 Other specified hypothyroidism: Secondary | ICD-10-CM | POA: Diagnosis not present

## 2022-01-11 DIAGNOSIS — J310 Chronic rhinitis: Secondary | ICD-10-CM | POA: Diagnosis not present

## 2022-01-11 DIAGNOSIS — E042 Nontoxic multinodular goiter: Secondary | ICD-10-CM | POA: Diagnosis not present

## 2022-01-11 DIAGNOSIS — I5022 Chronic systolic (congestive) heart failure: Secondary | ICD-10-CM | POA: Diagnosis not present

## 2022-01-11 DIAGNOSIS — N183 Chronic kidney disease, stage 3 unspecified: Secondary | ICD-10-CM | POA: Diagnosis not present

## 2022-01-11 DIAGNOSIS — I130 Hypertensive heart and chronic kidney disease with heart failure and stage 1 through stage 4 chronic kidney disease, or unspecified chronic kidney disease: Secondary | ICD-10-CM | POA: Diagnosis not present

## 2022-01-11 DIAGNOSIS — K219 Gastro-esophageal reflux disease without esophagitis: Secondary | ICD-10-CM | POA: Diagnosis not present

## 2022-01-11 DIAGNOSIS — F419 Anxiety disorder, unspecified: Secondary | ICD-10-CM | POA: Diagnosis not present

## 2022-01-11 DIAGNOSIS — F32A Depression, unspecified: Secondary | ICD-10-CM | POA: Diagnosis not present

## 2022-01-11 DIAGNOSIS — M5431 Sciatica, right side: Secondary | ICD-10-CM | POA: Diagnosis not present

## 2022-01-11 DIAGNOSIS — Z794 Long term (current) use of insulin: Secondary | ICD-10-CM | POA: Diagnosis not present

## 2022-01-11 DIAGNOSIS — D631 Anemia in chronic kidney disease: Secondary | ICD-10-CM | POA: Diagnosis not present

## 2022-01-11 DIAGNOSIS — Z471 Aftercare following joint replacement surgery: Secondary | ICD-10-CM | POA: Diagnosis not present

## 2022-01-11 DIAGNOSIS — E785 Hyperlipidemia, unspecified: Secondary | ICD-10-CM | POA: Diagnosis not present

## 2022-01-13 ENCOUNTER — Other Ambulatory Visit: Payer: Self-pay

## 2022-01-13 DIAGNOSIS — M1990 Unspecified osteoarthritis, unspecified site: Secondary | ICD-10-CM | POA: Insufficient documentation

## 2022-01-13 DIAGNOSIS — G4733 Obstructive sleep apnea (adult) (pediatric): Secondary | ICD-10-CM | POA: Diagnosis not present

## 2022-01-13 DIAGNOSIS — T8859XA Other complications of anesthesia, initial encounter: Secondary | ICD-10-CM | POA: Insufficient documentation

## 2022-01-21 ENCOUNTER — Encounter: Payer: Self-pay | Admitting: Cardiology

## 2022-01-21 ENCOUNTER — Ambulatory Visit: Payer: BC Managed Care – PPO | Attending: Cardiology | Admitting: Cardiology

## 2022-01-21 VITALS — BP 122/70 | HR 84 | Ht 73.0 in | Wt 346.0 lb

## 2022-01-21 DIAGNOSIS — E7800 Pure hypercholesterolemia, unspecified: Secondary | ICD-10-CM

## 2022-01-21 DIAGNOSIS — I428 Other cardiomyopathies: Secondary | ICD-10-CM

## 2022-01-21 DIAGNOSIS — G4733 Obstructive sleep apnea (adult) (pediatric): Secondary | ICD-10-CM

## 2022-01-21 DIAGNOSIS — I10 Essential (primary) hypertension: Secondary | ICD-10-CM

## 2022-01-21 DIAGNOSIS — Z9884 Bariatric surgery status: Secondary | ICD-10-CM

## 2022-01-21 NOTE — Patient Instructions (Signed)

## 2022-01-21 NOTE — Progress Notes (Signed)
Cardiology Office Note:    Date:  01/21/2022   ID:  [name] Sosa, DOB 01-08-1961, MRN [id number]  PCP:  Lavone Orn, MD  Cardiologist:  Jenean Lindau, MD   Referring MD: Lavone Orn, MD    ASSESSMENT:    1. Nonischemic cardiomyopathy ([place])   2. Primary hypertension   3. Obstructive sleep apnea   4. History of Lap Roux-en-Y gastric bypass 03/05/13   5. Pure hypercholesterolemia    PLAN:    In order of problems listed above:  Primary prevention stressed with the patient.  Importance of compliance with diet and medication stressed any vocalized understanding.  He was advised to ambulate to the best of his ability. Essential hypertension: Blood pressure stable and diet was emphasized.  Lifestyle modification urged. Cardiomyopathy: On guideline directed medical therapy and tolerating well.  Diet emphasized.  Salt intake issues discussed.  I discussed again evaluation for intracardiac defibrillator and he is strongly against it.  I respect his wishes. Morbid obesity: Weight reduction stressed to the promises to do better.  Risks of obesity revisited. Patient will be seen in follow-up appointment in 9 months or earlier if the patient has any concerns    Medication Adjustments/Labs and Tests Ordered: Current medicines are reviewed at length with the patient today.  Concerns regarding medicines are outlined above.  No orders of the defined types were placed in this encounter.  No orders of the defined types were placed in this encounter.    No chief complaint on file.    History of Present Illness:    [name] Sosa is a 61 y.o. male.  Patient has past medical history of nonischemic cardiomyopathy, essential hypertension, dyslipidemia and obesity.  She denies any problems at this time and takes care of activities of daily living.  No chest pain orthopnea or PND.  He is undergoing thyroid and hip replacement surgery and he is very happy about the results.  At the time  of my evaluation, the patient is alert awake oriented and in no distress.  Past Medical History:  Diagnosis Date   Adenomatous polyp of colon 05/14/2020   Anemia    Anxiety    HX OF ANXIETY ATTACKS--CAN'T SLEEP ON BACK-FEELS LIKE HE CAN'T BREATHE   Apnea, sleep    SEVERE OSA PER STUDY 12/01/12   Arthritis    Bariatric surgery status 05/14/2020   Body mass index (BMI) 45.0-49.9, adult ([place]) 05/14/2020   Cancer ([place])    HX OF NON HODGKIN'S LYMPHOMA -IN REMISSION   Cardiomyopathy ([place]) 04/27/2018   Cardiomyopathy, nonischemic ([place])    DX 2003 CARDIAC CATH, ECHO 2006 SHOWED EF IMPROVED TO 45-50%--PER CARDIOLOGY OFFICE NOTES DR. Linard Millers FROM 01/20/10   CHF (congestive heart failure) ([place]) 2003   Chronic kidney disease    HX OF CHRONIC KIDNEY DISEASE STAGE III - EVALUATED AT [facility] IN THE PAST AND FELT TO BE RELATED TO NSAID'S AND CHEMO.  PT  STATES HE HAS NOT SEEN KIDNEY SPECIALIST AT [facility] IN OVER A YEAR.   Chronic rhinitis 83/38/2505   Chronic systolic heart failure (H[facility])    CARDIOLOGIST IS DR. Linard Millers   CKD (chronic kidney disease), stage III ([place]) 12/08/2012   Claustrophobia    Complication of anesthesia    had muscle weakness and body aches the day after the surgery   Diabetes ([place]) 04/27/2012   Diabetic renal disease ([place]) 05/14/2020   Diffuse large cell lymphoma in remission ([place]) 04/27/2012   Gastro-esophageal reflux disease without  esophagitis 05/14/2020   GERD (gastroesophageal reflux disease)    NOT TAKING ANY MEDS FOR REFLUX   History of adenomatous polyp of colon 05/14/2020   History of Lap Roux-en-Y gastric bypass 03/05/13 03/23/2013   History of lobectomy of thyroid 11/06/2021   Hydrocele 05/14/2020   Hyperlipidemia    Hypertension    IDDM (insulin dependent diabetes mellitus) 12/08/2012   Long term (current) use of insulin ([place]) 05/14/2020   Lumbar spondylosis 04/02/2021   Morbid obesity ([place])    Multinodular goiter (nontoxic) 10/16/2021   Multiple thyroid nodules     HX OF NODULES=PT STATES BIOPSY-TOLD ALL OKAY   Non-Hodgkin lymphoma (HCC)    DX ABOUT 2007-TX'D WITH CHEMO-IN REMISSION   Nonischemic cardiomyopathy ([place]) 11/25/2017   Obesity 05/14/2020   Obstructive sleep apnea 12/08/2012   Primary localized osteoarthritis of pelvic region and thigh 04/02/2021   Primary localized osteoarthritis of right hip 12/29/2021   Primary osteoarthritis of right hip 12/29/2021   Pure hypercholesterolemia 05/14/2020   Sciatica, right side 05/14/2020   Shortness of breath    WALKING UPSTAIRS   Skin sensation disturbance 05/14/2020   Subclinical hypothyroidism 05/14/2020   Substernal thyroid goiter 10/08/2021   Testicular hypofunction 05/14/2020   Thyroid nodule 05/14/2020   Tracheal deviation 10/08/2021    Past Surgical History:  Procedure Laterality Date   BIOPSY  09/25/2019   Procedure: BIOPSY;  Surgeon: Otis Brace, MD;  Location: [facility];  Service: Gastroenterology;;   COLONOSCOPY WITH PROPOFOL N/A 01/09/2013   Procedure: COLONOSCOPY WITH PROPOFOL;  Surgeon: Garlan Fair, MD;  Location: [facility];  Service: Endoscopy;  Laterality: N/A;   COLONOSCOPY WITH PROPOFOL N/A 09/25/2019   Procedure: COLONOSCOPY WITH PROPOFOL;  Surgeon: Otis Brace, MD;  Location: [facility];  Service: Gastroenterology;  Laterality: N/A;   GASTRIC ROUX-EN-Y N/A 03/05/2013   Procedure: LAPAROSCOPIC ROUX-EN-Y GASTRIC BYPASS WITH UPPER ENDOSCOPY;  Surgeon: Gayland Curry, MD;  Location: [facility];  Service: General;  Laterality: N/A;   HYDROCELE EXCISION  02/09/2011   Procedure: HYDROCELECTOMY ADULT;  Surgeon: Fredricka Bonine, MD;  Location: [facility];  Service: Urology;  Laterality: Right;   POLYPECTOMY  09/25/2019   Procedure: POLYPECTOMY;  Surgeon: Otis Brace, MD;  Location: [facility];  Service: Gastroenterology;;   THYROID LOBECTOMY N/A 10/16/2021   Procedure: LEFT THYROID LOBECTOMY;  Surgeon: Armandina Gemma, MD;  Location: [facility];  Service: General;   Laterality: N/A;   TOTAL HIP ARTHROPLASTY Right 12/29/2021   Procedure: RIGHT TOTAL HIP ARTHROPLASTY ANTERIOR APPROACH;  Surgeon: Melrose Nakayama, MD;  Location: [facility];  Service: Orthopedics;  Laterality: Right;   WISDOM TOOTH EXTRACTION      Current Medications: Current Meds  Medication Sig   acetaminophen (TYLENOL) 650 MG CR tablet Take 1,300 mg by mouth every 8 (eight) hours as needed for pain.   ALPRAZolam (XANAX) 0.5 MG tablet Take 0.5 mg by mouth 2 (two) times daily as needed for anxiety.   aspirin EC 81 MG tablet Take 1 tablet (81 mg total) by mouth 2 (two) times daily after a meal. For 2 weeks then back to once a day for dvt prevention.   CALCIUM CITRATE PO Take 2 tablets by mouth 2 (two) times daily.   carvedilol (COREG) 25 MG tablet Take 1 tablet (25 mg total) by mouth 2 (two) times daily.   FARXIGA 10 MG TABS tablet Take 10 mg by mouth daily.   HUMALOG KWIKPEN 200 UNIT/ML KwikPen Inject 20-25 Units into the skin 3 (three) times daily  with meals. 20 units sq   Multiple Vitamins-Minerals (BARIATRIC MULTIVITAMINS/IRON PO) Take 1 capsule by mouth daily.   oxyCODONE (OXY IR/ROXICODONE) 5 MG immediate release tablet Take 1 tablet (5 mg total) by mouth every 6 (six) hours as needed for moderate pain or severe pain (post op pain).   sacubitril-valsartan (ENTRESTO) 49-51 MG Take 1 tablet by mouth 2 (two) times daily.   SOLIQUA 100-33 UNT-MCG/ML SOPN Inject 30 Units into the skin at bedtime.   tiZANidine (ZANAFLEX) 4 MG tablet Take 1 tablet (4 mg total) by mouth every 6 (six) hours as needed for muscle spasms.   traMADol (ULTRAM) 50 MG tablet Take 100 mg by mouth every 12 (twelve) hours.   zolpidem (AMBIEN) 10 MG tablet Take 10 mg by mouth at bedtime.     Allergies:   Aciphex [rabeprazole sodium], Acyclovir and related, and Nsaids   Social History   Socioeconomic History   Marital status: Single    Spouse name: Not on file   Number of children: Not on file   Years of education: Not  on file   Highest education level: Not on file  Occupational History   Not on file  Tobacco Use   Smoking status: Never   Smokeless tobacco: Never  Vaping Use   Vaping Use: Never used  Substance and Sexual Activity   Alcohol use: Yes    Comment: RARELY   Drug use: No   Sexual activity: Not Currently  Other Topics Concern   Not on file  Social History Narrative   Not on file   Social Determinants of Health   Financial Resource Strain: Not on file  Food Insecurity: No Food Insecurity (12/29/2021)   Hunger Vital Sign    Worried About Running Out of Food in the Last Year: Never true    Ran Out of Food in the Last Year: Never true  Transportation Needs: No Transportation Needs (12/29/2021)   PRAPARE - Hydrologist (Medical): No    Lack of Transportation (Non-Medical): No  Physical Activity: Not on file  Stress: Not on file  Social Connections: Not on file     Family History: The patient's He was adopted. Family history is unknown by patient.  ROS:   Please see the history of present illness.    All other systems reviewed and are negative.  EKGs/Labs/Other Studies Reviewed:    The following studies were reviewed today: I discussed my findings with the patient at length.   Recent Labs: 10/16/2021: ALT 24 12/30/2021: BUN 28; Creatinine, Ser 1.90; Hemoglobin 11.5; Platelets 184; Potassium 5.1; Sodium 133  Recent Lipid Panel    Component Value Date/Time   CHOL 165 02/21/2019 0913   TRIG 116 02/21/2019 0913   HDL 57 02/21/2019 0913   CHOLHDL 2.9 02/21/2019 0913   CHOLHDL 3.2 08/07/2013 0815   VLDL 19 08/07/2013 0815   LDLCALC 87 02/21/2019 0913    Physical Exam:    VS:  BP 122/70   Pulse 84   Ht '6\' 1"'$  (1.854 m)   Wt (!) 346 lb (156.9 kg)   SpO2 98%   BMI 45.65 kg/m     Wt Readings from Last 3 Encounters:  01/21/22 (!) 346 lb (156.9 kg)  12/29/21 (!) 343 lb 7.6 oz (155.8 kg)  12/24/21 (!) 330 lb (149.7 kg)     GEN: Patient  is in no acute distress HEENT: Normal NECK: No JVD; No carotid bruits LYMPHATICS: No lymphadenopathy CARDIAC: Hear sounds  regular, 2/6 systolic murmur at the apex. RESPIRATORY:  Clear to auscultation without rales, wheezing or rhonchi  ABDOMEN: Soft, non-tender, non-distended MUSCULOSKELETAL:  No edema; No deformity  SKIN: Warm and dry NEUROLOGIC:  Alert and oriented x 3 PSYCHIATRIC:  Normal affect   Signed, Jenean Lindau, MD  01/21/2022 4:21 PM    [facility]

## 2022-01-28 DIAGNOSIS — E119 Type 2 diabetes mellitus without complications: Secondary | ICD-10-CM | POA: Diagnosis not present

## 2022-01-28 DIAGNOSIS — H52203 Unspecified astigmatism, bilateral: Secondary | ICD-10-CM | POA: Diagnosis not present

## 2022-02-01 DIAGNOSIS — Z96641 Presence of right artificial hip joint: Secondary | ICD-10-CM | POA: Diagnosis not present

## 2022-02-01 DIAGNOSIS — G4733 Obstructive sleep apnea (adult) (pediatric): Secondary | ICD-10-CM | POA: Diagnosis not present

## 2022-02-01 DIAGNOSIS — Z471 Aftercare following joint replacement surgery: Secondary | ICD-10-CM | POA: Diagnosis not present

## 2022-02-04 DIAGNOSIS — Z23 Encounter for immunization: Secondary | ICD-10-CM | POA: Diagnosis not present

## 2022-02-04 DIAGNOSIS — M1611 Unilateral primary osteoarthritis, right hip: Secondary | ICD-10-CM | POA: Diagnosis not present

## 2022-02-04 DIAGNOSIS — G4733 Obstructive sleep apnea (adult) (pediatric): Secondary | ICD-10-CM | POA: Diagnosis not present

## 2022-02-04 DIAGNOSIS — E1122 Type 2 diabetes mellitus with diabetic chronic kidney disease: Secondary | ICD-10-CM | POA: Diagnosis not present

## 2022-02-04 DIAGNOSIS — H00012 Hordeolum externum right lower eyelid: Secondary | ICD-10-CM | POA: Diagnosis not present

## 2022-02-13 DIAGNOSIS — G4733 Obstructive sleep apnea (adult) (pediatric): Secondary | ICD-10-CM | POA: Diagnosis not present

## 2022-02-18 DIAGNOSIS — G4733 Obstructive sleep apnea (adult) (pediatric): Secondary | ICD-10-CM | POA: Diagnosis not present

## 2022-03-02 ENCOUNTER — Other Ambulatory Visit: Payer: Self-pay | Admitting: Internal Medicine

## 2022-03-02 DIAGNOSIS — E278 Other specified disorders of adrenal gland: Secondary | ICD-10-CM

## 2022-03-02 DIAGNOSIS — K769 Liver disease, unspecified: Secondary | ICD-10-CM

## 2022-03-03 ENCOUNTER — Encounter: Payer: Self-pay | Admitting: Internal Medicine

## 2022-03-11 ENCOUNTER — Ambulatory Visit
Admission: RE | Admit: 2022-03-11 | Discharge: 2022-03-11 | Disposition: A | Discharge status: Home / Self Care | Payer: BC Managed Care – PPO | Source: Ambulatory Visit | Attending: Internal Medicine | Admitting: Internal Medicine

## 2022-03-11 DIAGNOSIS — K769 Liver disease, unspecified: Secondary | ICD-10-CM

## 2022-03-11 DIAGNOSIS — Z8572 Personal history of non-Hodgkin lymphomas: Secondary | ICD-10-CM | POA: Diagnosis not present

## 2022-03-11 DIAGNOSIS — N261 Atrophy of kidney (terminal): Secondary | ICD-10-CM | POA: Diagnosis not present

## 2022-03-11 DIAGNOSIS — K760 Fatty (change of) liver, not elsewhere classified: Secondary | ICD-10-CM | POA: Diagnosis not present

## 2022-03-11 DIAGNOSIS — E278 Other specified disorders of adrenal gland: Secondary | ICD-10-CM

## 2022-03-11 MED ORDER — GADOBENATE DIMEGLUMINE 529 MG/ML IV SOLN
20.0000 mL | Freq: Once | INTRAVENOUS | Status: AC | PRN
  Administered 2022-03-11: 20 mL via INTRAVENOUS

## 2022-03-29 ENCOUNTER — Other Ambulatory Visit: Payer: Self-pay | Admitting: Cardiology

## 2022-04-09 DIAGNOSIS — Z96641 Presence of right artificial hip joint: Secondary | ICD-10-CM | POA: Diagnosis not present

## 2022-04-09 DIAGNOSIS — Z471 Aftercare following joint replacement surgery: Secondary | ICD-10-CM | POA: Diagnosis not present

## 2022-04-21 DIAGNOSIS — G4733 Obstructive sleep apnea (adult) (pediatric): Secondary | ICD-10-CM | POA: Diagnosis not present

## 2022-05-05 DIAGNOSIS — M7061 Trochanteric bursitis, right hip: Secondary | ICD-10-CM | POA: Diagnosis not present

## 2022-05-05 DIAGNOSIS — G4733 Obstructive sleep apnea (adult) (pediatric): Secondary | ICD-10-CM | POA: Diagnosis not present

## 2022-06-07 DIAGNOSIS — M7061 Trochanteric bursitis, right hip: Secondary | ICD-10-CM | POA: Diagnosis not present

## 2022-08-03 DIAGNOSIS — G4733 Obstructive sleep apnea (adult) (pediatric): Secondary | ICD-10-CM | POA: Diagnosis not present

## 2022-09-09 DIAGNOSIS — M1611 Unilateral primary osteoarthritis, right hip: Secondary | ICD-10-CM | POA: Diagnosis not present

## 2022-09-09 DIAGNOSIS — Z125 Encounter for screening for malignant neoplasm of prostate: Secondary | ICD-10-CM | POA: Diagnosis not present

## 2022-09-09 DIAGNOSIS — E0590 Thyrotoxicosis, unspecified without thyrotoxic crisis or storm: Secondary | ICD-10-CM | POA: Diagnosis not present

## 2022-09-09 DIAGNOSIS — Z79899 Other long term (current) drug therapy: Secondary | ICD-10-CM | POA: Diagnosis not present

## 2022-09-09 DIAGNOSIS — Z9884 Bariatric surgery status: Secondary | ICD-10-CM | POA: Diagnosis not present

## 2022-09-09 DIAGNOSIS — E049 Nontoxic goiter, unspecified: Secondary | ICD-10-CM | POA: Diagnosis not present

## 2022-09-09 DIAGNOSIS — E1122 Type 2 diabetes mellitus with diabetic chronic kidney disease: Secondary | ICD-10-CM | POA: Diagnosis not present

## 2022-09-09 DIAGNOSIS — E7800 Pure hypercholesterolemia, unspecified: Secondary | ICD-10-CM | POA: Diagnosis not present

## 2022-10-18 DIAGNOSIS — G4733 Obstructive sleep apnea (adult) (pediatric): Secondary | ICD-10-CM | POA: Diagnosis not present

## 2022-11-01 DIAGNOSIS — G4733 Obstructive sleep apnea (adult) (pediatric): Secondary | ICD-10-CM | POA: Diagnosis not present

## 2022-11-18 ENCOUNTER — Other Ambulatory Visit: Payer: Self-pay

## 2022-11-19 ENCOUNTER — Encounter: Payer: Self-pay | Admitting: Cardiology

## 2022-11-19 ENCOUNTER — Ambulatory Visit: Payer: BC Managed Care – PPO | Admitting: Cardiology

## 2022-11-19 VITALS — BP 130/82 | HR 92 | Ht 73.0 in | Wt 346.0 lb

## 2022-11-19 DIAGNOSIS — I10 Essential (primary) hypertension: Secondary | ICD-10-CM | POA: Diagnosis not present

## 2022-11-19 DIAGNOSIS — E7800 Pure hypercholesterolemia, unspecified: Secondary | ICD-10-CM

## 2022-11-19 DIAGNOSIS — Z9884 Bariatric surgery status: Secondary | ICD-10-CM

## 2022-11-19 DIAGNOSIS — I5020 Unspecified systolic (congestive) heart failure: Secondary | ICD-10-CM

## 2022-11-19 DIAGNOSIS — N183 Chronic kidney disease, stage 3 unspecified: Secondary | ICD-10-CM

## 2022-11-19 DIAGNOSIS — E1169 Type 2 diabetes mellitus with other specified complication: Secondary | ICD-10-CM

## 2022-11-19 DIAGNOSIS — G4733 Obstructive sleep apnea (adult) (pediatric): Secondary | ICD-10-CM

## 2022-11-19 DIAGNOSIS — Z794 Long term (current) use of insulin: Secondary | ICD-10-CM

## 2022-11-19 DIAGNOSIS — I428 Other cardiomyopathies: Secondary | ICD-10-CM

## 2022-11-19 NOTE — Progress Notes (Unsigned)
Cardiology Office Note:    Date:  11/20/2022   ID:  [name] Sosa, DOB 1961-02-11, MRN [id number]  PCP:  Kirby Funk, MD (Inactive)  Cardiologist:  Garwin Brothers, MD   Referring MD: No ref. provider found    ASSESSMENT:    1. Nonischemic cardiomyopathy (HCC)   2. Primary hypertension   3. Type 2 diabetes mellitus with other specified complication, with long-term current use of insulin (HCC)   4. Stage 3 chronic kidney disease, unspecified whether stage 3a or 3b CKD (HCC)   5. Pure hypercholesterolemia   6. Systolic congestive heart failure, unspecified HF chronicity (HCC)   7. Obstructive sleep apnea   8. Morbid obesity (HCC)   9. History of Lap Roux-en-Y gastric bypass 03/05/13    PLAN:    In order of problems listed above:  Primary prevention stressed with the patient.  Importance of compliance with diet medication stressed and patient verbalized standing. He was advised to ambulate and exercise to the best of his ability. Cardiomyopathy: I discussed this with him at length.  Will do an echocardiogram again to review and understand EF.  He is on guideline directed medical therapy.  I discussed issues such as electrophysiology appointment and evaluation for possible defibrillator but he is vehemently against it as he has been in the past.  Benefits risks explained including sudden cardiac death and he vocalized understanding and questions were answered to satisfaction. Essential hypertension: Blood pressure stable and diet was emphasized. Sleep apnea: Morbid obesity: I discussed this with him at length.  Lifestyle modification urged risks of obesity explained and he promises to do better.  Patient had multiple questions which were answered to satisfaction. Patient will be seen in follow-up appointment in 6 months or earlier if the patient has any concerns. He will have complete blood work when he comes fasting in the next few days.   Medication Adjustments/Labs and Tests  Ordered: Current medicines are reviewed at length with the patient today.  Concerns regarding medicines are outlined above.  Orders Placed This Encounter  Procedures   CBC   Comprehensive metabolic panel   Lipid panel   Hemoglobin A1c   TSH   VITAMIN D 25 Hydroxy (Vit-D Deficiency, Fractures)   EKG 12-Lead   ECHOCARDIOGRAM COMPLETE   No orders of the defined types were placed in this encounter.    Chief Complaint  Patient presents with   Follow-up     History of Present Illness:    [name] Sosa is a 62 y.o. male.  Patient has past medical history of essential hypertension, diabetes mellitus, nonischemic cardiomyopathy and morbid obesity.  He denies any problems at this time and takes care of activities of daily living.  No chest pain orthopnea or PND.  Overall he leads a sedentary lifestyle.  He is trying to do his best to lose weight.  Past Medical History:  Diagnosis Date   Adenomatous polyp of colon 05/14/2020   Anemia    Anxiety    HX OF ANXIETY ATTACKS--CAN'T SLEEP ON BACK-FEELS LIKE HE CAN'T BREATHE   Apnea, sleep    SEVERE OSA PER STUDY 12/01/12   Arthritis    Bariatric surgery status 05/14/2020   Body mass index (BMI) 45.0-49.9, adult (HCC) 05/14/2020   Cancer (HCC)    HX OF NON HODGKIN'S LYMPHOMA -IN REMISSION   Cardiomyopathy (HCC) 04/27/2018   Cardiomyopathy, nonischemic (HCC)    DX 2003 CARDIAC CATH, ECHO 2006 SHOWED EF IMPROVED TO 45-50%--PER CARDIOLOGY OFFICE NOTES  DR. Mendel Ryder FROM 01/20/10   CHF (congestive heart failure) (HCC) 2003   Chronic kidney disease    HX OF CHRONIC KIDNEY DISEASE STAGE III - EVALUATED AT [facility] IN THE PAST AND FELT TO BE RELATED TO NSAID'S AND CHEMO.  PT  STATES HE HAS NOT SEEN KIDNEY SPECIALIST AT [facility] IN OVER A YEAR.   Chronic rhinitis 05/14/2020   Chronic systolic heart failure (HCC)    CARDIOLOGIST IS DR. Mendel Ryder   CKD (chronic kidney disease), stage III (HCC) 12/08/2012   Claustrophobia    Complication of anesthesia     had muscle weakness and body aches the day after the surgery   Diabetes (HCC) 04/27/2012   Diabetic renal disease (HCC) 05/14/2020   Diffuse large cell lymphoma in remission (HCC) 04/27/2012   Gastro-esophageal reflux disease without esophagitis 05/14/2020   GERD (gastroesophageal reflux disease)    NOT TAKING ANY MEDS FOR REFLUX   History of adenomatous polyp of colon 05/14/2020   History of Lap Roux-en-Y gastric bypass 03/05/13 03/23/2013   History of lobectomy of thyroid 11/06/2021   Hydrocele 05/14/2020   Hyperlipidemia    Hypertension    IDDM (insulin dependent diabetes mellitus) 12/08/2012   Long term (current) use of insulin (HCC) 05/14/2020   Lumbar spondylosis 04/02/2021   Morbid obesity (HCC)    Multinodular goiter (nontoxic) 10/16/2021   Multiple thyroid nodules    HX OF NODULES=PT STATES BIOPSY-TOLD ALL OKAY   Non-Hodgkin lymphoma (HCC)    DX ABOUT 2007-TX'D WITH CHEMO-IN REMISSION   Nonischemic cardiomyopathy (HCC) 11/25/2017   Obesity 05/14/2020   Obstructive sleep apnea 12/08/2012   Primary localized osteoarthritis of pelvic region and thigh 04/02/2021   Primary localized osteoarthritis of right hip 12/29/2021   Primary osteoarthritis of right hip 12/29/2021   Pure hypercholesterolemia 05/14/2020   Sciatica, right side 05/14/2020   Shortness of breath    WALKING UPSTAIRS   Skin sensation disturbance 05/14/2020   Subclinical hypothyroidism 05/14/2020   Substernal thyroid goiter 10/08/2021   Testicular hypofunction 05/14/2020   Thyroid nodule 05/14/2020   Tracheal deviation 10/08/2021    Past Surgical History:  Procedure Laterality Date   BIOPSY  09/25/2019   Procedure: BIOPSY;  Surgeon: Kathi Der, MD;  Location: [facility];  Service: Gastroenterology;;   COLONOSCOPY WITH PROPOFOL N/A 01/09/2013   Procedure: COLONOSCOPY WITH PROPOFOL;  Surgeon: Charolett Bumpers, MD;  Location: [facility];  Service: Endoscopy;  Laterality: N/A;   COLONOSCOPY WITH PROPOFOL  N/A 09/25/2019   Procedure: COLONOSCOPY WITH PROPOFOL;  Surgeon: Kathi Der, MD;  Location: [facility];  Service: Gastroenterology;  Laterality: N/A;   GASTRIC ROUX-EN-Y N/A 03/05/2013   Procedure: LAPAROSCOPIC ROUX-EN-Y GASTRIC BYPASS WITH UPPER ENDOSCOPY;  Surgeon: [name], MD;  Location: [facility];  Service: General;  Laterality: N/A;   HYDROCELE EXCISION  02/09/2011   Procedure: HYDROCELECTOMY ADULT;  Surgeon: Antony Haste, MD;  Location: [facility];  Service: Urology;  Laterality: Right;   POLYPECTOMY  09/25/2019   Procedure: POLYPECTOMY;  Surgeon: Kathi Der, MD;  Location: [facility];  Service: Gastroenterology;;   THYROID LOBECTOMY N/A 10/16/2021   Procedure: LEFT THYROID LOBECTOMY;  Surgeon: Darnell Level, MD;  Location: [facility];  Service: General;  Laterality: N/A;   TOTAL HIP ARTHROPLASTY Right 12/29/2021   Procedure: RIGHT TOTAL HIP ARTHROPLASTY ANTERIOR APPROACH;  Surgeon: Marcene Corning, MD;  Location: [facility];  Service: Orthopedics;  Laterality: Right;   WISDOM TOOTH EXTRACTION      Current Medications: Current Meds  Medication Sig   acetaminophen (TYLENOL) 650 MG CR tablet Take 1,300 mg by mouth every 8 (eight) hours as needed for pain.   ALPRAZolam (XANAX) 0.5 MG tablet Take 0.5 mg by mouth 2 (two) times daily as needed for anxiety.   aspirin EC 81 MG tablet Take 1 tablet (81 mg total) by mouth 2 (two) times daily after a meal. For 2 weeks then back to once a day for dvt prevention.   CALCIUM CITRATE PO Take 2 tablets by mouth 2 (two) times daily.   carvedilol (COREG) 25 MG tablet Take 1 tablet (25 mg total) by mouth 2 (two) times daily.   FARXIGA 10 MG TABS tablet Take 10 mg by mouth daily.   HUMALOG KWIKPEN 200 UNIT/ML KwikPen Inject 20-25 Units into the skin 3 (three) times daily with meals. 20 units sq   MOUNJARO 2.5 MG/0.5ML Pen Inject 2.5 mg into the skin once a week.   Multiple Vitamins-Minerals (BARIATRIC MULTIVITAMINS/IRON PO) Take 1  capsule by mouth daily.   oxyCODONE (OXY IR/ROXICODONE) 5 MG immediate release tablet Take 1 tablet (5 mg total) by mouth every 6 (six) hours as needed for moderate pain or severe pain (post op pain).   sacubitril-valsartan (ENTRESTO) 49-51 MG Take 1 tablet by mouth 2 (two) times daily.   SOLIQUA 100-33 UNT-MCG/ML SOPN Inject 30 Units into the skin at bedtime.   zolpidem (AMBIEN) 10 MG tablet Take 10 mg by mouth at bedtime.     Allergies:   Aciphex [rabeprazole sodium], Acyclovir and related, and Nsaids   Social History   Socioeconomic History   Marital status: Single    Spouse name: Not on file   Number of children: Not on file   Years of education: Not on file   Highest education level: Not on file  Occupational History   Not on file  Tobacco Use   Smoking status: Never   Smokeless tobacco: Never  Vaping Use   Vaping status: Never Used  Substance and Sexual Activity   Alcohol use: Yes    Comment: RARELY   Drug use: No   Sexual activity: Not Currently  Other Topics Concern   Not on file  Social History Narrative   Not on file   Social Determinants of Health   Financial Resource Strain: Not on file  Food Insecurity: No Food Insecurity (12/29/2021)   Hunger Vital Sign    Worried About Running Out of Food in the Last Year: Never true    Ran Out of Food in the Last Year: Never true  Transportation Needs: No Transportation Needs (12/29/2021)   PRAPARE - Administrator, Civil Service (Medical): No    Lack of Transportation (Non-Medical): No  Physical Activity: Not on file  Stress: Not on file  Social Connections: Not on file     Family History: The patient's He was adopted. Family history is unknown by patient.  ROS:   Please see the history of present illness.    All other systems reviewed and are negative.  EKGs/Labs/Other Studies Reviewed:    The following studies were reviewed today: I discussed my findings with the patient at length.   Recent  Labs: 12/30/2021: BUN 28; Creatinine, Ser 1.90; Hemoglobin 11.5; Platelets 184; Potassium 5.1; Sodium 133  Recent Lipid Panel    Component Value Date/Time   CHOL 165 02/21/2019 0913   TRIG 116 02/21/2019 0913   HDL 57 02/21/2019 0913   CHOLHDL 2.9 02/21/2019 0913   CHOLHDL 3.2  08/07/2013 0815   VLDL 19 08/07/2013 0815   LDLCALC 87 02/21/2019 0913    Physical Exam:    VS:  BP 130/82 (BP Location: Left Arm, Patient Position: Sitting, Cuff Size: Large)   Pulse 92   Ht 6\' 1"  (1.854 m)   Wt (!) 346 lb (156.9 kg)   SpO2 92%   BMI 45.65 kg/m     Wt Readings from Last 3 Encounters:  11/19/22 (!) 346 lb (156.9 kg)  01/21/22 (!) 346 lb (156.9 kg)  12/29/21 (!) 343 lb 7.6 oz (155.8 kg)     GEN: Patient is in no acute distress HEENT: Normal NECK: No JVD; No carotid bruits LYMPHATICS: No lymphadenopathy CARDIAC: Hear sounds regular, 2/6 systolic murmur at the apex. RESPIRATORY:  Clear to auscultation without rales, wheezing or rhonchi  ABDOMEN: Soft, non-tender, non-distended MUSCULOSKELETAL:  No edema; No deformity  SKIN: Warm and dry NEUROLOGIC:  Alert and oriented x 3 PSYCHIATRIC:  Normal affect   Signed, Garwin Brothers, MD  11/20/2022 10:50 PM    [facility]

## 2022-11-19 NOTE — Patient Instructions (Signed)
Medication Instructions:  Your physician recommends that you continue on your current medications as directed. Please refer to the Current Medication list given to you today.  *If you need a refill on your cardiac medications before your next appointment, please call your pharmacy*   Lab Work: Your physician recommends that you return for lab work in: the next few days for CMP, CBC, TSH, vitamin D, A1C and lipids. You need to have labs done when you are fasting. [facility] lab is located on the 3rd floor, [address]. Hours are Monday - Friday 8 am to 4 pm, closed 11:30 am to 1:00 pm. You do NOT need an appointment.   If you have labs (blood work) drawn today and your tests are completely normal, you will receive your results only by: MyChart Message (if you have MyChart) OR A paper copy in the mail If you have any lab test that is abnormal or we need to change your treatment, we will call you to review the results.   Testing/Procedures: Your physician has requested that you have an echocardiogram. Echocardiography is a painless test that uses sound waves to create images of your heart. It provides your doctor with information about the size and shape of your heart and how well your heart's chambers and valves are working. This procedure takes approximately one hour. There are no restrictions for this procedure. Please do NOT wear cologne, perfume, aftershave, or lotions (deodorant is allowed). Please arrive 15 minutes prior to your appointment time.     Follow-Up: At [facility], you and your health needs are our priority.  As part of our continuing mission to provide you with exceptional heart care, we have created designated Provider Care Teams.  These Care Teams include your primary Cardiologist (physician) and Advanced Practice Providers (APPs -  Physician Assistants and Nurse Practitioners) who all work together to provide you with the care you need, when you need it.  We recommend  signing up for the patient portal called "MyChart".  Sign up information is provided on this After Visit Summary.  MyChart is used to connect with patients for Virtual Visits (Telemedicine).  Patients are able to view lab/test results, encounter notes, upcoming appointments, etc.  Non-urgent messages can be sent to your provider as well.   To learn more about what you can do with MyChart, go to [email or web address].    Your next appointment:   9 month(s)  The format for your next appointment:   In Person  Provider:   Belva Crome, MD   Other Instructions Echocardiogram An echocardiogram is a test that uses sound waves (ultrasound) to produce images of the heart. Images from an echocardiogram can provide important information about: Heart size and shape. The size and thickness and movement of your heart's walls. Heart muscle function and strength. Heart valve function or if you have stenosis. Stenosis is when the heart valves are too narrow. If blood is flowing backward through the heart valves (regurgitation). A tumor or infectious growth around the heart valves. Areas of heart muscle that are not working well because of poor blood flow or injury from a heart attack. Aneurysm detection. An aneurysm is a weak or damaged part of an artery wall. The wall bulges out from the normal force of blood pumping through the body. Tell a health care provider about: Any allergies you have. All medicines you are taking, including vitamins, herbs, eye drops, creams, and over-the-counter medicines. Any blood disorders you have. Any surgeries you have  had. Any medical conditions you have. Whether you are pregnant or may be pregnant. What are the risks? Generally, this is a safe test. However, problems may occur, including an allergic reaction to dye (contrast) that may be used during the test. What happens before the test? No specific preparation is needed. You may eat and drink normally. What  happens during the test? You will take off your clothes from the waist up and put on a hospital gown. Electrodes or electrocardiogram (ECG)patches may be placed on your chest. The electrodes or patches are then connected to a device that monitors your heart rate and rhythm. You will lie down on a table for an ultrasound exam. A gel will be applied to your chest to help sound waves pass through your skin. A handheld device, called a transducer, will be pressed against your chest and moved over your heart. The transducer produces sound waves that travel to your heart and bounce back (or "echo" back) to the transducer. These sound waves will be captured in real-time and changed into images of your heart that can be viewed on a video monitor. The images will be recorded on a computer and reviewed by your health care provider. You may be asked to change positions or hold your breath for a short time. This makes it easier to get different views or better views of your heart. In some cases, you may receive contrast through an IV in one of your veins. This can improve the quality of the pictures from your heart. The procedure may vary among health care providers and hospitals.   What can I expect after the test? You may return to your normal, everyday life, including diet, activities, and medicines, unless your health care provider tells you not to do that. Follow these instructions at home: It is up to you to get the results of your test. Ask your health care provider, or the department that is doing the test, when your results will be ready. Keep all follow-up visits. This is important. Summary An echocardiogram is a test that uses sound waves (ultrasound) to produce images of the heart. Images from an echocardiogram can provide important information about the size and shape of your heart, heart muscle function, heart valve function, and other possible heart problems. You do not need to do anything to  prepare before this test. You may eat and drink normally. After the echocardiogram is completed, you may return to your normal, everyday life, unless your health care provider tells you not to do that. This information is not intended to replace advice given to you by your health care provider. Make sure you discuss any questions you have with your health care provider. Document Revised: 10/30/2019 Document Reviewed: 10/30/2019 Elsevier Patient Education  2021 Elsevier Inc.   Important Information About Sugar

## 2022-11-24 DIAGNOSIS — Z01818 Encounter for other preprocedural examination: Secondary | ICD-10-CM | POA: Diagnosis not present

## 2022-11-24 DIAGNOSIS — Z86010 Personal history of colonic polyps: Secondary | ICD-10-CM | POA: Diagnosis not present

## 2022-11-24 DIAGNOSIS — I429 Cardiomyopathy, unspecified: Secondary | ICD-10-CM | POA: Diagnosis not present

## 2022-11-26 ENCOUNTER — Telehealth: Payer: Self-pay | Admitting: *Deleted

## 2022-11-26 NOTE — Telephone Encounter (Signed)
Called patient to offer sooner appt at [address] location/unable to leave message/kbl 11/26/22

## 2022-11-26 NOTE — Telephone Encounter (Signed)
   Pre-operative Risk Assessment    Patient Name: [name] Sosa  DOB: 1960-11-04 MRN: [id number]      Request for Surgical Clearance    Procedure:   Colonoscopy  Date of Surgery:  Clearance TBD                                 Surgeon:  Dr. Kerin Salen Surgeon's Group or Practice Name:  [facility] Phone number:  [phone] Fax number:  [phone]   Type of Clearance Requested:   - Medical  - Pharmacy:  Hold Aspirin Not Indicated   Type of Anesthesia:   Propofol   Additional requests/questions:  Pt had last appointment with Dr. Tomie China on 11/19/2022.  Signed, Emmit Pomfret   11/26/2022, 9:38 AM

## 2022-11-26 NOTE — Telephone Encounter (Signed)
Dr. Tomie China would like patient to have echo prior having colonoscopy.  Will await the echo before moving forward with clearance.

## 2022-11-26 NOTE — Telephone Encounter (Signed)
Dr. Tomie China , patient's chart was reviewed for preoperative cardiac evaluation.  He/she was seen by you on 11/19/2022 and you planned a repeat echocardiogram due to HFrEF, last echo 09/19/2020 EF of 30-35%.  Patient is on GDMT and refused ICD. Do you want him to wait on colonoscopy prior to having colonoscopy?   Please route your response to p cv div preop.  Thank you, Samara Deist

## 2022-11-29 NOTE — Telephone Encounter (Signed)
Pt is needing an echo before being cleared. Dr. Theresa Mulligan nurse is trying to get sooner echo appt. I will update the requesting office as a duplicate request came over today.

## 2022-11-30 NOTE — Telephone Encounter (Signed)
Patient offered Thursday or Friday of this week at [facility] location but could not make it. He is now scheduled for 09/17 a week from today/KBL 09/10

## 2022-12-07 ENCOUNTER — Ambulatory Visit (HOSPITAL_COMMUNITY): Payer: BC Managed Care – PPO | Attending: Cardiology

## 2022-12-07 DIAGNOSIS — E7800 Pure hypercholesterolemia, unspecified: Secondary | ICD-10-CM | POA: Diagnosis not present

## 2022-12-07 DIAGNOSIS — E1169 Type 2 diabetes mellitus with other specified complication: Secondary | ICD-10-CM | POA: Diagnosis not present

## 2022-12-07 DIAGNOSIS — N183 Chronic kidney disease, stage 3 unspecified: Secondary | ICD-10-CM | POA: Diagnosis not present

## 2022-12-07 DIAGNOSIS — I10 Essential (primary) hypertension: Secondary | ICD-10-CM | POA: Diagnosis not present

## 2022-12-07 DIAGNOSIS — Z794 Long term (current) use of insulin: Secondary | ICD-10-CM | POA: Diagnosis not present

## 2022-12-07 DIAGNOSIS — I5020 Unspecified systolic (congestive) heart failure: Secondary | ICD-10-CM | POA: Diagnosis not present

## 2022-12-07 DIAGNOSIS — I428 Other cardiomyopathies: Secondary | ICD-10-CM | POA: Insufficient documentation

## 2022-12-07 LAB — ECHOCARDIOGRAM COMPLETE
Area-P 1/2: 1.96 cm2
Calc EF: 27.6 %
S' Lateral: 5.7 cm
Single Plane A2C EF: 21.8 %
Single Plane A4C EF: 29.8 %

## 2022-12-07 MED ORDER — PERFLUTREN LIPID MICROSPHERE
1.0000 mL | INTRAVENOUS | Status: AC | PRN
Start: 2022-12-07 — End: 2022-12-07
  Administered 2022-12-07: 3 mL via INTRAVENOUS

## 2022-12-10 ENCOUNTER — Other Ambulatory Visit: Payer: Self-pay | Admitting: Cardiology

## 2022-12-23 ENCOUNTER — Other Ambulatory Visit (HOSPITAL_BASED_OUTPATIENT_CLINIC_OR_DEPARTMENT_OTHER): Payer: BC Managed Care – PPO

## 2023-01-12 DIAGNOSIS — I429 Cardiomyopathy, unspecified: Secondary | ICD-10-CM | POA: Diagnosis not present

## 2023-01-12 DIAGNOSIS — E1122 Type 2 diabetes mellitus with diabetic chronic kidney disease: Secondary | ICD-10-CM | POA: Diagnosis not present

## 2023-01-12 DIAGNOSIS — R972 Elevated prostate specific antigen [PSA]: Secondary | ICD-10-CM | POA: Diagnosis not present

## 2023-01-12 DIAGNOSIS — N1832 Chronic kidney disease, stage 3b: Secondary | ICD-10-CM | POA: Diagnosis not present

## 2023-01-28 DIAGNOSIS — H52203 Unspecified astigmatism, bilateral: Secondary | ICD-10-CM | POA: Diagnosis not present

## 2023-01-28 DIAGNOSIS — E119 Type 2 diabetes mellitus without complications: Secondary | ICD-10-CM | POA: Diagnosis not present

## 2023-01-28 DIAGNOSIS — H5203 Hypermetropia, bilateral: Secondary | ICD-10-CM | POA: Diagnosis not present

## 2023-01-30 ENCOUNTER — Other Ambulatory Visit: Payer: Self-pay | Admitting: Cardiology

## 2023-01-31 DIAGNOSIS — G4733 Obstructive sleep apnea (adult) (pediatric): Secondary | ICD-10-CM | POA: Diagnosis not present

## 2023-03-09 ENCOUNTER — Telehealth: Payer: Self-pay | Admitting: *Deleted

## 2023-03-09 NOTE — Telephone Encounter (Signed)
   Name: [name] Sosa  DOB: 05/18/1960  MRN: [id number]  Primary Cardiologist: Garwin Brothers, MD   Preoperative team, please contact this patient and set up a phone call appointment for further preoperative risk assessment. Please obtain consent and complete medication review. Thank you for your help.  I confirm that guidance regarding antiplatelet and oral anticoagulation therapy has been completed and, if necessary, noted below.  None requested.  I also confirmed the patient resides in the state of [place]. As per [facility] Medical Board telemedicine laws, the patient must reside in the state in which the provider is licensed.   Ronney Asters, NP 03/09/2023, 10:53 AM [facility]

## 2023-03-09 NOTE — Telephone Encounter (Signed)
Pt has been scheduled tele preop appt 04/11/23. Med rec and consent are done.     Patient Consent for Virtual Visit        [name] Sosa has provided verbal consent on 03/09/2023 for a virtual visit (video or telephone).   CONSENT FOR VIRTUAL VISIT FOR:  [name] Sosa  By participating in this virtual visit I agree to the following:  I hereby voluntarily request, consent and authorize [facility] and its employed or contracted physicians, physician assistants, nurse practitioners or other licensed health care professionals (the Practitioner), to provide me with telemedicine health care services (the "Services") as deemed necessary by the treating Practitioner. I acknowledge and consent to receive the Services by the Practitioner via telemedicine. I understand that the telemedicine visit will involve communicating with the Practitioner through live audiovisual communication technology and the disclosure of certain medical information by electronic transmission. I acknowledge that I have been given the opportunity to request an in-person assessment or other available alternative prior to the telemedicine visit and am voluntarily participating in the telemedicine visit.  I understand that I have the right to withhold or withdraw my consent to the use of telemedicine in the course of my care at any time, without affecting my right to future care or treatment, and that the Practitioner or I may terminate the telemedicine visit at any time. I understand that I have the right to inspect all information obtained and/or recorded in the course of the telemedicine visit and may receive copies of available information for a reasonable fee.  I understand that some of the potential risks of receiving the Services via telemedicine include:  Delay or interruption in medical evaluation due to technological equipment failure or disruption; Information transmitted may not be sufficient (e.g. poor  resolution of images) to allow for appropriate medical decision making by the Practitioner; and/or  In rare instances, security protocols could fail, causing a breach of personal health information.  Furthermore, I acknowledge that it is my responsibility to provide information about my medical history, conditions and care that is complete and accurate to the best of my ability. I acknowledge that Practitioner's advice, recommendations, and/or decision may be based on factors not within their control, such as incomplete or inaccurate data provided by me or distortions of diagnostic images or specimens that may result from electronic transmissions. I understand that the practice of medicine is not an exact science and that Practitioner makes no warranties or guarantees regarding treatment outcomes. I acknowledge that a copy of this consent can be made available to me via my patient portal [facility] MyChart), or I can request a printed copy by calling the office of [facility].    I understand that my insurance will be billed for this visit.   I have read or had this consent read to me. I understand the contents of this consent, which adequately explains the benefits and risks of the Services being provided via telemedicine.  I have been provided ample opportunity to ask questions regarding this consent and the Services and have had my questions answered to my satisfaction. I give my informed consent for the services to be provided through the use of telemedicine in my medical care

## 2023-03-09 NOTE — Telephone Encounter (Signed)
   Pre-operative Risk Assessment    Patient Name: [name] Sosa  DOB: 14-Mar-1961 MRN: [id number]  DATE OF LAST VISIT: 11/19/22 DR. [facility] DATE OF NEXT VISIT: NONE    Request for Surgical Clearance    Procedure:   COLONOSCOPY  Date of Surgery:  Clearance 05/03/23                                 Surgeon:  DR. Levora Angel Surgeon's Group or Practice Name:  [facility] Phone number:  [phone] Fax number:  [phone]   Type of Clearance Requested:   - Medical ; NO MEDICATIONS LISTED AS NEEDING TO BE HELD   Type of Anesthesia:   PROPOFOL   Additional requests/questions:    Elpidio Anis   03/09/2023, 10:47 AM

## 2023-03-09 NOTE — Telephone Encounter (Signed)
Pt has been scheduled tele preop appt 04/11/23. Med rec and consent are done.

## 2023-03-10 ENCOUNTER — Other Ambulatory Visit: Payer: Self-pay | Admitting: Gastroenterology

## 2023-03-29 DIAGNOSIS — M48062 Spinal stenosis, lumbar region with neurogenic claudication: Secondary | ICD-10-CM | POA: Diagnosis not present

## 2023-04-11 ENCOUNTER — Encounter: Payer: Self-pay | Admitting: Emergency Medicine

## 2023-04-11 ENCOUNTER — Ambulatory Visit: Payer: BC Managed Care – PPO | Attending: Cardiovascular Disease | Admitting: Emergency Medicine

## 2023-04-11 DIAGNOSIS — Z01810 Encounter for preprocedural cardiovascular examination: Secondary | ICD-10-CM | POA: Diagnosis not present

## 2023-04-11 NOTE — Progress Notes (Signed)
Virtual Visit via Telephone Note   Because of [name] Sosa's co-morbid illnesses, he is at least at moderate risk for complications without adequate follow up.  This format is felt to be most appropriate for this patient at this time.  The patient did not have access to video technology/had technical difficulties with video requiring transitioning to audio format only (telephone).  All issues noted in this document were discussed and addressed.  No physical exam could be performed with this format.  Please refer to the patient's chart for his consent to telehealth for [facility].  Evaluation Performed:  Preoperative cardiovascular risk assessment _____________   Date:  04/11/2023   Patient ID:  [name] Sosa, DOB 1961-01-18, MRN [id number] Patient Location:  Home Provider location:   Office  Primary Care Provider:  No primary care provider on file. Primary Cardiologist:  Garwin Brothers, MD  Chief Complaint / Patient Profile   63 y.o. y/o male with a h/o nonischemic cardiomyopathy, T2DM, CKD stage 3, HLD, systolic HF, OSA, obesity who is pending colonoscopy on 05/03/2023 with Dr. Levora Angel and presents today for telephonic preoperative cardiovascular risk assessment.  History of Present Illness    [name] Sosa is a 63 y.o. male who presents via audio/video conferencing for a telehealth visit today.  Pt was last seen in cardiology clinic on 11/19/2022 by Dr. Tomie China.  At that time [name] Sosa was doing well.  The patient is now pending procedure as outlined above. Since his last visit, he denies chest pain, shortness of breath, lower extremity edema, fatigue, palpitations, melena, hematuria, hemoptysis, diaphoresis, weakness, presyncope, syncope, orthopnea, and PND.  Past Medical History    Past Medical History:  Diagnosis Date   Adenomatous polyp of colon 05/14/2020   Anemia    Anxiety    HX OF ANXIETY ATTACKS--CAN'T SLEEP ON BACK-FEELS LIKE HE CAN'T  BREATHE   Apnea, sleep    SEVERE OSA PER STUDY 12/01/12   Arthritis    Bariatric surgery status 05/14/2020   Body mass index (BMI) 45.0-49.9, adult (HCC) 05/14/2020   Cancer (HCC)    HX OF NON HODGKIN'S LYMPHOMA -IN REMISSION   Cardiomyopathy (HCC) 04/27/2018   Cardiomyopathy, nonischemic (HCC)    DX 2003 CARDIAC CATH, ECHO 2006 SHOWED EF IMPROVED TO 45-50%--PER CARDIOLOGY OFFICE NOTES DR. Mendel Ryder FROM 01/20/10   CHF (congestive heart failure) (HCC) 2003   Chronic kidney disease    HX OF CHRONIC KIDNEY DISEASE STAGE III - EVALUATED AT [facility] IN THE PAST AND FELT TO BE RELATED TO NSAID'S AND CHEMO.  PT  STATES HE HAS NOT SEEN KIDNEY SPECIALIST AT [facility] IN OVER A YEAR.   Chronic rhinitis 05/14/2020   Chronic systolic heart failure (HCC)    CARDIOLOGIST IS DR. Mendel Ryder   CKD (chronic kidney disease), stage III (HCC) 12/08/2012   Claustrophobia    Complication of anesthesia    had muscle weakness and body aches the day after the surgery   Diabetes (HCC) 04/27/2012   Diabetic renal disease (HCC) 05/14/2020   Diffuse large cell lymphoma in remission (HCC) 04/27/2012   Gastro-esophageal reflux disease without esophagitis 05/14/2020   GERD (gastroesophageal reflux disease)    NOT TAKING ANY MEDS FOR REFLUX   History of adenomatous polyp of colon 05/14/2020   History of Lap Roux-en-Y gastric bypass 03/05/13 03/23/2013   History of lobectomy of thyroid 11/06/2021   Hydrocele 05/14/2020   Hyperlipidemia    Hypertension    IDDM (insulin dependent  diabetes mellitus) 12/08/2012   Long term (current) use of insulin (HCC) 05/14/2020   Lumbar spondylosis 04/02/2021   Morbid obesity (HCC)    Multinodular goiter (nontoxic) 10/16/2021   Multiple thyroid nodules    HX OF NODULES=PT STATES BIOPSY-TOLD ALL OKAY   Non-Hodgkin lymphoma (HCC)    DX ABOUT 2007-TX'D WITH CHEMO-IN REMISSION   Nonischemic cardiomyopathy (HCC) 11/25/2017   Obesity 05/14/2020   Obstructive sleep apnea 12/08/2012   Primary  localized osteoarthritis of pelvic region and thigh 04/02/2021   Primary localized osteoarthritis of right hip 12/29/2021   Primary osteoarthritis of right hip 12/29/2021   Pure hypercholesterolemia 05/14/2020   Sciatica, right side 05/14/2020   Shortness of breath    WALKING UPSTAIRS   Skin sensation disturbance 05/14/2020   Subclinical hypothyroidism 05/14/2020   Substernal thyroid goiter 10/08/2021   Testicular hypofunction 05/14/2020   Thyroid nodule 05/14/2020   Tracheal deviation 10/08/2021   Past Surgical History:  Procedure Laterality Date   BIOPSY  09/25/2019   Procedure: BIOPSY;  Surgeon: Kathi Der, MD;  Location: [facility];  Service: Gastroenterology;;   COLONOSCOPY WITH PROPOFOL N/A 01/09/2013   Procedure: COLONOSCOPY WITH PROPOFOL;  Surgeon: Charolett Bumpers, MD;  Location: [facility];  Service: Endoscopy;  Laterality: N/A;   COLONOSCOPY WITH PROPOFOL N/A 09/25/2019   Procedure: COLONOSCOPY WITH PROPOFOL;  Surgeon: Kathi Der, MD;  Location: [facility];  Service: Gastroenterology;  Laterality: N/A;   GASTRIC ROUX-EN-Y N/A 03/05/2013   Procedure: LAPAROSCOPIC ROUX-EN-Y GASTRIC BYPASS WITH UPPER ENDOSCOPY;  Surgeon: [name], MD;  Location: [facility];  Service: General;  Laterality: N/A;   HYDROCELE EXCISION  02/09/2011   Procedure: HYDROCELECTOMY ADULT;  Surgeon: Antony Haste, MD;  Location: [facility];  Service: Urology;  Laterality: Right;   POLYPECTOMY  09/25/2019   Procedure: POLYPECTOMY;  Surgeon: Kathi Der, MD;  Location: [facility];  Service: Gastroenterology;;   THYROID LOBECTOMY N/A 10/16/2021   Procedure: LEFT THYROID LOBECTOMY;  Surgeon: Darnell Level, MD;  Location: [facility];  Service: General;  Laterality: N/A;   TOTAL HIP ARTHROPLASTY Right 12/29/2021   Procedure: RIGHT TOTAL HIP ARTHROPLASTY ANTERIOR APPROACH;  Surgeon: Marcene Corning, MD;  Location: [facility];  Service: Orthopedics;  Laterality: Right;   WISDOM TOOTH EXTRACTION       Allergies  Allergies  Allergen Reactions   Aciphex [Rabeprazole Sodium]     Doesn't work    Acyclovir And Related     unkn   Nsaids     AVOIDS NSAID'S DUE TO HX OF CHRONIC KIDNEY DISEASE    Home Medications    Prior to Admission medications   Medication Sig Start Date End Date Taking? Authorizing Provider  acetaminophen (TYLENOL) 650 MG CR tablet Take 1,300 mg by mouth every 8 (eight) hours as needed for pain.    [provider]  ALPRAZolam Prudy Feeler) 0.5 MG tablet Take 0.5 mg by mouth 2 (two) times daily as needed for anxiety.    [provider]  aspirin EC 81 MG tablet Take 1 tablet (81 mg total) by mouth 2 (two) times daily after a meal. For 2 weeks then back to once a day for dvt prevention. Patient taking differently: Take 81 mg by mouth daily. 12/30/21   Elodia Florence, PA-C  CALCIUM CITRATE PO Take 2 tablets by mouth 2 (two) times daily.    [provider]  carvedilol (COREG) 25 MG tablet TAKE 1 TABLET BY MOUTH TWICE A DAY 01/31/23   Revankar, Aundra Dubin, MD  FARXIGA 10 MG TABS tablet Take 10 mg by mouth daily. 07/10/21   [provider]  HUMALOG KWIKPEN 200 UNIT/ML KwikPen Inject 20-25 Units into the skin 3 (three) times daily with meals. 20 units sq 11/19/19   [provider]  MOUNJARO 2.5 MG/0.5ML Pen Inject 2.5 mg into the skin once a week. 11/11/22   [provider]  Multiple Vitamins-Minerals (BARIATRIC MULTIVITAMINS/IRON PO) Take 1 capsule by mouth daily.    [provider]  oxyCODONE (OXY IR/ROXICODONE) 5 MG immediate release tablet Take 1 tablet (5 mg total) by mouth every 6 (six) hours as needed for moderate pain or severe pain (post op pain). Patient not taking: Reported on 03/09/2023 12/30/21   Elodia Florence, PA-C  sacubitril-valsartan (ENTRESTO) 49-51 MG Take 1 tablet by mouth 2 (two) times daily. 12/10/22   Revankar, Aundra Dubin, MD  SOLIQUA 100-33 UNT-MCG/ML SOPN Inject 30 Units into the skin at bedtime. 03/09/21    [provider]  zolpidem (AMBIEN) 10 MG tablet Take 10 mg by mouth at bedtime. 01/26/13   [provider]    Physical Exam    Vital Signs:  [name] Sosa does not have vital signs available for review today.  Given telephonic nature of communication, physical exam is limited. AAOx3. NAD. Normal affect.  Speech and respirations are unlabored.  Accessory Clinical Findings    None  Assessment & Plan    1.  Preoperative Cardiovascular Risk Assessment: According to the Revised Cardiac Risk Index (RCRI), his Perioperative Risk of Major Cardiac Event is (%): 11. His Functional Capacity in METs is: 7.99 according to the Duke Activity Status Index (DASI).  Therefore, based on ACC/AHA guidelines, patient would be at acceptable risk for the planned procedure without further cardiovascular testing.  The patient was advised that if he develops new symptoms prior to surgery to contact our office to arrange for a follow-up visit, and he verbalized understanding.  Aspirin not managed by cardiology. Recommendations for holding Aspirin prior to surgery should come from managing provider (PCP).   A copy of this note will be routed to requesting surgeon.  Time:   Today, I have spent 7 minutes with the patient with telehealth technology discussing medical history, symptoms, and management plan.     Denyce Robert, NP  04/11/2023, 7:43 AM

## 2023-04-16 DIAGNOSIS — G4733 Obstructive sleep apnea (adult) (pediatric): Secondary | ICD-10-CM | POA: Diagnosis not present

## 2023-04-26 ENCOUNTER — Encounter (HOSPITAL_COMMUNITY): Payer: Self-pay | Admitting: Gastroenterology

## 2023-05-02 DIAGNOSIS — G4733 Obstructive sleep apnea (adult) (pediatric): Secondary | ICD-10-CM | POA: Diagnosis not present

## 2023-05-03 ENCOUNTER — Ambulatory Visit (HOSPITAL_COMMUNITY): Payer: Self-pay | Admitting: Registered Nurse

## 2023-05-03 ENCOUNTER — Encounter (HOSPITAL_COMMUNITY): Payer: Self-pay | Admitting: Gastroenterology

## 2023-05-03 ENCOUNTER — Other Ambulatory Visit: Payer: Self-pay

## 2023-05-03 ENCOUNTER — Ambulatory Visit (HOSPITAL_COMMUNITY)
Admission: RE | Admit: 2023-05-03 | Discharge: 2023-05-03 | Disposition: A | Discharge status: Home / Self Care | Payer: BC Managed Care – PPO | Attending: Gastroenterology | Admitting: Gastroenterology

## 2023-05-03 ENCOUNTER — Encounter (HOSPITAL_COMMUNITY)
Admission: RE | Disposition: A | Discharge status: Home / Self Care | Payer: Self-pay | Source: Home / Self Care | Attending: Gastroenterology

## 2023-05-03 DIAGNOSIS — Z6841 Body Mass Index (BMI) 40.0 and over, adult: Secondary | ICD-10-CM | POA: Insufficient documentation

## 2023-05-03 DIAGNOSIS — Z860101 Personal history of adenomatous and serrated colon polyps: Secondary | ICD-10-CM | POA: Diagnosis not present

## 2023-05-03 DIAGNOSIS — K648 Other hemorrhoids: Secondary | ICD-10-CM | POA: Insufficient documentation

## 2023-05-03 DIAGNOSIS — K6389 Other specified diseases of intestine: Secondary | ICD-10-CM | POA: Diagnosis not present

## 2023-05-03 DIAGNOSIS — E1122 Type 2 diabetes mellitus with diabetic chronic kidney disease: Secondary | ICD-10-CM | POA: Diagnosis not present

## 2023-05-03 DIAGNOSIS — N183 Chronic kidney disease, stage 3 unspecified: Secondary | ICD-10-CM | POA: Diagnosis not present

## 2023-05-03 DIAGNOSIS — I130 Hypertensive heart and chronic kidney disease with heart failure and stage 1 through stage 4 chronic kidney disease, or unspecified chronic kidney disease: Secondary | ICD-10-CM | POA: Insufficient documentation

## 2023-05-03 DIAGNOSIS — Z9884 Bariatric surgery status: Secondary | ICD-10-CM | POA: Insufficient documentation

## 2023-05-03 DIAGNOSIS — Z794 Long term (current) use of insulin: Secondary | ICD-10-CM | POA: Insufficient documentation

## 2023-05-03 DIAGNOSIS — Z7985 Long-term (current) use of injectable non-insulin antidiabetic drugs: Secondary | ICD-10-CM | POA: Diagnosis not present

## 2023-05-03 DIAGNOSIS — G4733 Obstructive sleep apnea (adult) (pediatric): Secondary | ICD-10-CM | POA: Insufficient documentation

## 2023-05-03 DIAGNOSIS — D120 Benign neoplasm of cecum: Secondary | ICD-10-CM | POA: Diagnosis not present

## 2023-05-03 DIAGNOSIS — I5022 Chronic systolic (congestive) heart failure: Secondary | ICD-10-CM | POA: Insufficient documentation

## 2023-05-03 DIAGNOSIS — Z1211 Encounter for screening for malignant neoplasm of colon: Secondary | ICD-10-CM | POA: Diagnosis not present

## 2023-05-03 DIAGNOSIS — K219 Gastro-esophageal reflux disease without esophagitis: Secondary | ICD-10-CM | POA: Diagnosis not present

## 2023-05-03 DIAGNOSIS — D124 Benign neoplasm of descending colon: Secondary | ICD-10-CM | POA: Diagnosis not present

## 2023-05-03 DIAGNOSIS — K635 Polyp of colon: Secondary | ICD-10-CM | POA: Diagnosis not present

## 2023-05-03 DIAGNOSIS — D175 Benign lipomatous neoplasm of intra-abdominal organs: Secondary | ICD-10-CM | POA: Insufficient documentation

## 2023-05-03 DIAGNOSIS — Z09 Encounter for follow-up examination after completed treatment for conditions other than malignant neoplasm: Secondary | ICD-10-CM | POA: Diagnosis not present

## 2023-05-03 HISTORY — PX: COLONOSCOPY WITH PROPOFOL: SHX5780

## 2023-05-03 HISTORY — PX: BIOPSY: SHX5522

## 2023-05-03 HISTORY — PX: POLYPECTOMY: SHX5525

## 2023-05-03 LAB — GLUCOSE, CAPILLARY: Glucose-Capillary: 129 mg/dL — ABNORMAL HIGH (ref 70–99)

## 2023-05-03 SURGERY — COLONOSCOPY WITH PROPOFOL
Anesthesia: Monitor Anesthesia Care

## 2023-05-03 MED ORDER — PROPOFOL 10 MG/ML IV BOLUS
INTRAVENOUS | Status: DC | PRN
  Administered 2023-05-03: 30 mg via INTRAVENOUS
  Administered 2023-05-03: 100 ug/kg/min via INTRAVENOUS

## 2023-05-03 MED ORDER — PHENYLEPHRINE HCL (PRESSORS) 10 MG/ML IV SOLN
INTRAVENOUS | Status: DC | PRN
  Administered 2023-05-03: 40 ug via INTRAVENOUS
  Administered 2023-05-03: 80 ug via INTRAVENOUS

## 2023-05-03 MED ORDER — PROPOFOL 1000 MG/100ML IV EMUL
INTRAVENOUS | Status: AC
  Filled 2023-05-03: qty 100

## 2023-05-03 MED ORDER — PROPOFOL 500 MG/50ML IV EMUL
INTRAVENOUS | Status: AC
  Filled 2023-05-03: qty 50

## 2023-05-03 MED ORDER — SODIUM CHLORIDE 0.9 % IV SOLN: INTRAVENOUS | Status: DC | PRN

## 2023-05-03 MED ORDER — KETAMINE HCL 10 MG/ML IJ SOLN
INTRAMUSCULAR | Status: AC
  Filled 2023-05-03: qty 1

## 2023-05-03 MED ORDER — PHENYLEPHRINE HCL (PRESSORS) 10 MG/ML IV SOLN
INTRAVENOUS | Status: AC
  Filled 2023-05-03: qty 1

## 2023-05-03 SURGICAL SUPPLY — 20 items

## 2023-05-03 NOTE — Discharge Instructions (Signed)
or on the toilet paper. Some abdominal soreness may be present for a day or two, also.  DIET: Your first meal following the procedure should be a light meal and then it is ok to progress to your normal diet. A half-sandwich or bowl of soup is an example of a good first meal. Heavy or fried foods are harder to digest and may make you feel nauseous or bloated. Drink plenty  of fluids but you should avoid alcoholic beverages for 24 hours. If you had a esophageal dilation, please see attached instructions for diet.    ACTIVITY: Your care partner should take you home directly after the procedure. You should plan to take it easy, moving slowly for the rest of the day. You can resume normal activity the day after the procedure however YOU SHOULD NOT DRIVE, use power tools, machinery or perform tasks that involve climbing or major physical exertion for 24 hours (because of the sedation medicines used during the test).   SYMPTOMS TO REPORT IMMEDIATELY: A gastroenterologist can be reached at any hour. Please call [phone]  for any of the following symptoms:  Following lower endoscopy (colonoscopy, flexible sigmoidoscopy) Excessive amounts of blood in the stool  Significant tenderness, worsening of abdominal pains  Swelling of the abdomen that is new, acute  Fever of 100 or higher  Following upper endoscopy (EGD, EUS, ERCP, esophageal dilation) Vomiting of blood or coffee ground material  New, significant abdominal pain  New, significant chest pain or pain under the shoulder blades  Painful or persistently difficult swallowing  New shortness of breath  Black, tarry-looking or red, bloody stools  FOLLOW UP:  If any biopsies were taken you will be contacted by phone or by letter within the next 1-3 weeks. Call [phone]  if you have not heard about the biopsies in 3 weeks.  Please also call with any specific questions about appointments or follow up tests. YOU HAD AN ENDOSCOPIC PROCEDURE TODAY: Refer to the procedure report and other information in the discharge instructions given to you for any specific questions about what was found during the examination. If this information does not answer your questions, please call [facility] office at [phone] to clarify.

## 2023-05-03 NOTE — Anesthesia Postprocedure Evaluation (Signed)
Anesthesia Post Note  Patient: [name] Sosa  Procedure(s) Performed: COLONOSCOPY WITH PROPOFOL BIOPSY POLYPECTOMY     Patient location during evaluation: PACU Anesthesia Type: MAC Level of consciousness: awake and alert Pain management: pain level controlled Vital Signs Assessment: post-procedure vital signs reviewed and stable Respiratory status: spontaneous breathing, nonlabored ventilation, respiratory function stable and patient connected to nasal cannula oxygen Cardiovascular status: stable and blood pressure returned to baseline Postop Assessment: no apparent nausea or vomiting Anesthetic complications: no  No notable events documented.  Last Vitals:  Vitals:   05/03/23 0910 05/03/23 0916  BP: 120/83   Pulse: 68 68  Resp: 16 (!) 26  Temp:    SpO2: 100% 100%    Last Pain:  Vitals:   05/03/23 0916  TempSrc:   PainSc: 0-No pain                 Kennieth Rad

## 2023-05-03 NOTE — Anesthesia Procedure Notes (Signed)
Procedure Name: MAC Date/Time: 05/03/2023 8:20 AM  Performed by: Micki Riley, CRNAPre-anesthesia Checklist: Patient identified, Emergency Drugs available, Suction available, Patient being monitored and Timeout performed Patient Re-evaluated:Patient Re-evaluated prior to induction Oxygen Delivery Method: Nasal cannula Preoxygenation: Pre-oxygenation with 100% oxygen Induction Type: IV induction Dental Injury: Teeth and Oropharynx as per pre-operative assessment  Comments: Pt placed on Opti -flow

## 2023-05-03 NOTE — Transfer of Care (Signed)
Immediate Anesthesia Transfer of Care Note  Patient: [name] Sosa  Procedure(s) Performed: COLONOSCOPY WITH PROPOFOL BIOPSY POLYPECTOMY  Patient Location: PACU and Endoscopy Unit  Anesthesia Type:MAC  Level of Consciousness: awake, alert , and oriented  Airway & Oxygen Therapy: Patient connected to face mask oxygen  Post-op Assessment: Report given to RN  Post vital signs: stable  Last Vitals:  Vitals Value Taken Time  BP 104/65 05/03/23 0844  Temp    Pulse 76 05/03/23 0845  Resp 24 05/03/23 0845  SpO2 100 % 05/03/23 0845  Vitals shown include unfiled device data.  Last Pain:  Vitals:   05/03/23 0730  TempSrc: Temporal  PainSc: 0-No pain         Complications: No notable events documented.

## 2023-05-03 NOTE — Anesthesia Preprocedure Evaluation (Signed)
Anesthesia Evaluation  Patient identified by MRN, date of birth, ID band Patient awake    Reviewed: Allergy & Precautions, NPO status , Patient's Chart, lab work & pertinent test results  Airway Mallampati: III  TM Distance: >3 FB Neck ROM: Full    Dental   Pulmonary shortness of breath, sleep apnea    Pulmonary exam normal        Cardiovascular hypertension, Pt. on medications +CHF  Normal cardiovascular exam     Neuro/Psych negative neurological ROS     GI/Hepatic Neg liver ROS,GERD  ,,  Endo/Other  diabetes, Type 2, Insulin DependentHypothyroidism  Class 3 obesity  Renal/GU CRFRenal disease     Musculoskeletal  (+) Arthritis ,    Abdominal   Peds  Hematology negative hematology ROS (+)   Anesthesia Other Findings   Reproductive/Obstetrics                             Anesthesia Physical Anesthesia Plan  ASA: 4  Anesthesia Plan: MAC   Post-op Pain Management:    Induction:   PONV Risk Score and Plan: 1 and Propofol infusion  Airway Management Planned: Natural Airway and Simple Face Mask  Additional Equipment:   Intra-op Plan:   Post-operative Plan:   Informed Consent: I have reviewed the patients History and Physical, chart, labs and discussed the procedure including the risks, benefits and alternatives for the proposed anesthesia with the patient or authorized representative who has indicated his/her understanding and acceptance.       Plan Discussed with:   Anesthesia Plan Comments:        Anesthesia Quick Evaluation

## 2023-05-03 NOTE — Op Note (Signed)
[facility] Patient Name: [name] Sosa Procedure Date: 05/03/2023 MRN: [id number] Attending MD: Kathi Der , MD, [id number] Date of Birth: 16-Jun-1960 CSN: [id number] Age: 63 Admit Type: Outpatient Procedure:                Colonoscopy Indications:              High risk colon cancer surveillance: Personal                            history of non-advanced adenoma Providers:                Kathi Der, MD, Suzy Bouchard, RN, Geoffery Lyons, Technician Referring MD:              Medicines:                Sedation Administered by an Anesthesia Professional Complications:            No immediate complications. Estimated Blood Loss:     Estimated blood loss was minimal. Procedure:                Pre-Anesthesia Assessment:                           - Prior to the procedure, a History and Physical                            was performed, and patient medications and                            allergies were reviewed. The patient's tolerance of                            previous anesthesia was also reviewed. The risks                            and benefits of the procedure and the sedation                            options and risks were discussed with the patient.                            All questions were answered, and informed consent                            was obtained. Prior Anticoagulants: The patient has                            taken no anticoagulant or antiplatelet agents                            except for aspirin. ASA Grade Assessment: IV - A  patient with severe systemic disease that is a                            constant threat to life. After reviewing the risks                            and benefits, the patient was deemed in                            satisfactory condition to undergo the procedure.                           After obtaining informed consent, the colonoscope                             was passed under direct vision. Throughout the                            procedure, the patient's blood pressure, pulse, and                            oxygen saturations were monitored continuously. The                            [id number]-HQ190L ([id number]) Olympus colonoscope was                            introduced through the anus and advanced to the the                            terminal ileum, with identification of the                            appendiceal orifice and IC valve. The colonoscopy                            was performed without difficulty. The patient                            tolerated the procedure well. The quality of the                            bowel preparation was adequate to identify polyps                            greater than 5 mm in size. The terminal ileum,                            ileocecal valve, appendiceal orifice, and rectum                            were photographed. Scope In: 8:20:05 AM Scope Out: 8:37:38 AM Scope Withdrawal Time: 0 hours 14 minutes 14 seconds  Total Procedure Duration:  0 hours 17 minutes 33 seconds  Findings:      The perianal and digital rectal examinations were normal.      The terminal ileum appeared normal.      The ileocecal valve was severely lipomatous. Biopsies were taken with a       cold forceps for histology.      Three sessile polyps were found in the descending colon and cecum. The       polyps were small in size. These polyps were removed with a cold snare.       Resection and retrieval were complete.      Internal hemorrhoids were found during retroflexion. The hemorrhoids       were small. Impression:               - The examined portion of the ileum was normal.                           - Lipomatous ileocecal valve. Biopsied.                           - Three small polyps in the descending colon and in                            the cecum, removed with a cold snare. Resected and                             retrieved.                           - Internal hemorrhoids. Moderate Sedation:      Moderate (conscious) sedation was personally administered by an       anesthesia professional. The following parameters were monitored: oxygen       saturation, heart rate, blood pressure, and response to care. Recommendation:           - Patient has a contact number available for                            emergencies. The signs and symptoms of potential                            delayed complications were discussed with the                            patient. Return to normal activities tomorrow.                            Written discharge instructions were provided to the                            patient.                           - Resume previous diet.                           - Continue present medications.                           -  Await pathology results.                           - Repeat colonoscopy date to be determined after                            pending pathology results are reviewed for                            surveillance based on pathology results.                           - Return to my office PRN. Procedure Code(s):        --- Professional ---                           [phone], Colonoscopy, flexible; with removal of                            tumor(s), polyp(s), or other lesion(s) by snare                            technique                           [id number], 59, Colonoscopy, flexible; with biopsy,                            single or multiple Diagnosis Code(s):        --- Professional ---                           Z86.010, Personal history of colonic polyps                           K64.8, Other hemorrhoids                           K63.89, Other specified diseases of intestine                           D12.4, Benign neoplasm of descending colon                           D12.0, Benign neoplasm of cecum CPT copyright 2022 American [employer].  All rights reserved. The codes documented in this report are preliminary and upon coder review may  be revised to meet current compliance requirements. Kathi Der, MD Kathi Der, MD 05/03/2023 8:51:13 AM Number of Addenda: 0

## 2023-05-03 NOTE — H&P (Signed)
Primary Care Physician:  Emilio Aspen, MD Primary Gastroenterologist:  Dr. Levora Angel  Reason for Consultation: Outpatient surveillance colonoscopy  HPI: [name] Sosa is a 63 y.o. male with past medical history mentioned below is here for outpatient surveillance colonoscopy for personal history of adenomatous polyps.  Denies any GI symptoms.  No family history of colon cancer.  Patient with history of cardiomyopathy.  EF of 25 to 30% as of September 2024.  Past Medical History:  Diagnosis Date   Adenomatous polyp of colon 05/14/2020   Anemia    Anxiety    HX OF ANXIETY ATTACKS--CAN'T SLEEP ON BACK-FEELS LIKE HE CAN'T BREATHE   Apnea, sleep    SEVERE OSA PER STUDY 12/01/12   Arthritis    Bariatric surgery status 05/14/2020   Body mass index (BMI) 45.0-49.9, adult (HCC) 05/14/2020   Cancer (HCC)    HX OF NON HODGKIN'S LYMPHOMA -IN REMISSION   Cardiomyopathy (HCC) 04/27/2018   Cardiomyopathy, nonischemic (HCC)    DX 2003 CARDIAC CATH, ECHO 2006 SHOWED EF IMPROVED TO 45-50%--PER CARDIOLOGY OFFICE NOTES DR. Mendel Ryder FROM 01/20/10   CHF (congestive heart failure) (HCC) 2003   Chronic kidney disease    HX OF CHRONIC KIDNEY DISEASE STAGE III - EVALUATED AT [facility] IN THE PAST AND FELT TO BE RELATED TO NSAID'S AND CHEMO.  PT  STATES HE HAS NOT SEEN KIDNEY SPECIALIST AT [facility] IN OVER A YEAR.   Chronic rhinitis 05/14/2020   Chronic systolic heart failure (HCC)    CARDIOLOGIST IS DR. Mendel Ryder   CKD (chronic kidney disease), stage III (HCC) 12/08/2012   Claustrophobia    Complication of anesthesia    had muscle weakness and body aches the day after the surgery   Diabetes (HCC) 04/27/2012   Diabetic renal disease (HCC) 05/14/2020   Diffuse large cell lymphoma in remission 04/27/2012   Gastro-esophageal reflux disease without esophagitis 05/14/2020   GERD (gastroesophageal reflux disease)    NOT TAKING ANY MEDS FOR REFLUX   History of adenomatous polyp of colon 05/14/2020    History of Lap Roux-en-Y gastric bypass 03/05/13 03/23/2013   History of lobectomy of thyroid 11/06/2021   Hydrocele 05/14/2020   Hyperlipidemia    Hypertension    IDDM (insulin dependent diabetes mellitus) 12/08/2012   Long term (current) use of insulin (HCC) 05/14/2020   Lumbar spondylosis 04/02/2021   Morbid obesity (HCC)    Multinodular goiter (nontoxic) 10/16/2021   Multiple thyroid nodules    HX OF NODULES=PT STATES BIOPSY-TOLD ALL OKAY   Non-Hodgkin lymphoma (HCC)    DX ABOUT 2007-TX'D WITH CHEMO-IN REMISSION   Nonischemic cardiomyopathy (HCC) 11/25/2017   Obesity 05/14/2020   Obstructive sleep apnea 12/08/2012   Primary localized osteoarthritis of pelvic region and thigh 04/02/2021   Primary localized osteoarthritis of right hip 12/29/2021   Primary osteoarthritis of right hip 12/29/2021   Pure hypercholesterolemia 05/14/2020   Sciatica, right side 05/14/2020   Shortness of breath    WALKING UPSTAIRS   Skin sensation disturbance 05/14/2020   Subclinical hypothyroidism 05/14/2020   Substernal thyroid goiter 10/08/2021   Testicular hypofunction 05/14/2020   Thyroid nodule 05/14/2020   Tracheal deviation 10/08/2021    Past Surgical History:  Procedure Laterality Date   BIOPSY  09/25/2019   Procedure: BIOPSY;  Surgeon: Kathi Der, MD;  Location: [facility];  Service: Gastroenterology;;   COLONOSCOPY WITH PROPOFOL N/A 01/09/2013   Procedure: COLONOSCOPY WITH PROPOFOL;  Surgeon: Charolett Bumpers, MD;  Location: [facility];  Service: Endoscopy;  Laterality: N/A;   COLONOSCOPY WITH PROPOFOL N/A 09/25/2019   Procedure: COLONOSCOPY WITH PROPOFOL;  Surgeon: Kathi Der, MD;  Location: [facility];  Service: Gastroenterology;  Laterality: N/A;   GASTRIC ROUX-EN-Y N/A 03/05/2013   Procedure: LAPAROSCOPIC ROUX-EN-Y GASTRIC BYPASS WITH UPPER ENDOSCOPY;  Surgeon: [name], MD;  Location: [facility];  Service: General;  Laterality: N/A;   HYDROCELE EXCISION   02/09/2011   Procedure: HYDROCELECTOMY ADULT;  Surgeon: Antony Haste, MD;  Location: [facility];  Service: Urology;  Laterality: Right;   POLYPECTOMY  09/25/2019   Procedure: POLYPECTOMY;  Surgeon: Kathi Der, MD;  Location: [facility];  Service: Gastroenterology;;   THYROID LOBECTOMY N/A 10/16/2021   Procedure: LEFT THYROID LOBECTOMY;  Surgeon: Darnell Level, MD;  Location: [facility];  Service: General;  Laterality: N/A;   TOTAL HIP ARTHROPLASTY Right 12/29/2021   Procedure: RIGHT TOTAL HIP ARTHROPLASTY ANTERIOR APPROACH;  Surgeon: Marcene Corning, MD;  Location: [facility];  Service: Orthopedics;  Laterality: Right;   WISDOM TOOTH EXTRACTION      Prior to Admission medications   Medication Sig Start Date End Date Taking? Authorizing Provider  acetaminophen (TYLENOL) 650 MG CR tablet Take 1,300 mg by mouth every 8 (eight) hours as needed for pain.   Yes [provider]  ALPRAZolam Prudy Feeler) 0.5 MG tablet Take 0.5 mg by mouth 2 (two) times daily as needed for anxiety.   Yes [provider]  aspirin EC 81 MG tablet Take 1 tablet (81 mg total) by mouth 2 (two) times daily after a meal. For 2 weeks then back to once a day for dvt prevention. Patient taking differently: Take 81 mg by mouth daily. 12/30/21  Yes Elodia Florence, PA-C  CALCIUM CITRATE PO Take 2 tablets by mouth 2 (two) times daily.   Yes [provider]  carvedilol (COREG) 25 MG tablet TAKE 1 TABLET BY MOUTH TWICE A DAY 01/31/23  Yes Revankar, Aundra Dubin, MD  FARXIGA 10 MG TABS tablet Take 10 mg by mouth daily. 07/10/21  Yes [provider]  HUMALOG KWIKPEN 200 UNIT/ML KwikPen Inject 20-25 Units into the skin 3 (three) times daily with meals. 20 units sq 11/19/19  Yes [provider]  MOUNJARO 2.5 MG/0.5ML Pen Inject 2.5 mg into the skin once a week. 11/11/22  Yes [provider]  Multiple Vitamins-Minerals (BARIATRIC MULTIVITAMINS/IRON PO) Take 1 capsule by mouth daily.   Yes  [provider]  sacubitril-valsartan (ENTRESTO) 49-51 MG Take 1 tablet by mouth 2 (two) times daily. 12/10/22  Yes Revankar, Aundra Dubin, MD  SOLIQUA 100-33 UNT-MCG/ML SOPN Inject 30 Units into the skin at bedtime. 03/09/21  Yes [provider]  zolpidem (AMBIEN) 10 MG tablet Take 10 mg by mouth at bedtime. 01/26/13  Yes [provider]  oxyCODONE (OXY IR/ROXICODONE) 5 MG immediate release tablet Take 1 tablet (5 mg total) by mouth every 6 (six) hours as needed for moderate pain or severe pain (post op pain). Patient not taking: Reported on 03/09/2023 12/30/21   Elodia Florence, PA-C    Scheduled Meds: Continuous Infusions: PRN Meds:.  Allergies as of 03/10/2023 - Review Complete 12/07/2022  Allergen Reaction Noted   Aciphex [rabeprazole sodium]  06/24/2013   Acyclovir and related  12/05/2017   Nsaids  02/27/2013    Family History  Adopted: Yes  Family history unknown: Yes    Social History   Socioeconomic History   Marital status: Single    Spouse name: Not on file   Number of  children: Not on file   Years of education: Not on file   Highest education level: Not on file  Occupational History   Not on file  Tobacco Use   Smoking status: Never   Smokeless tobacco: Never  Vaping Use   Vaping status: Never Used  Substance and Sexual Activity   Alcohol use: Yes    Comment: RARELY   Drug use: No   Sexual activity: Not Currently  Other Topics Concern   Not on file  Social History Narrative   Not on file   Social Drivers of Health   Financial Resource Strain: Not on file  Food Insecurity: No Food Insecurity (12/29/2021)   Hunger Vital Sign    Worried About Running Out of Food in the Last Year: Never true    Ran Out of Food in the Last Year: Never true  Transportation Needs: No Transportation Needs (12/29/2021)   PRAPARE - Administrator, Civil Service (Medical): No    Lack of Transportation (Non-Medical): No  Physical Activity: Not on  file  Stress: Not on file  Social Connections: Not on file  Intimate Partner Violence: Not At Risk (12/29/2021)   Humiliation, Afraid, Rape, and Kick questionnaire    Fear of Current or Ex-Partner: No    Emotionally Abused: No    Physically Abused: No    Sexually Abused: No    Review of Systems: All negative except as stated above in HPI.  Physical Exam: Vital signs: Vitals:   05/03/23 0730  BP: 133/72  Pulse: 78  Resp: 19  Temp: (!) 97.3 F (36.3 C)  SpO2: 100%     General: obese, not in acute distress Lungs:  Clear throughout to auscultation.   No wheezes, crackles, or rhonchi. No acute distress. Heart:  Regular rate and rhythm; no murmurs, clicks, rubs,  or gallops. Abdomen: Soft, nontender, nondistended, bowel sound present, no peritoneal signs Rectal:  Deferred  GI:  Lab Results: No results for input(s): "WBC", "HGB", "HCT", "PLT" in the last 72 hours. BMET No results for input(s): "NA", "K", "CL", "CO2", "GLUCOSE", "BUN", "CREATININE", "CALCIUM" in the last 72 hours. LFT No results for input(s): "PROT", "ALBUMIN", "AST", "ALT", "ALKPHOS", "BILITOT", "BILIDIR", "IBILI" in the last 72 hours. PT/INR No results for input(s): "LABPROT", "INR" in the last 72 hours.   Studies/Results: No results found.  Impression/Plan: -Personal history of adenomatous polyps -Cardiomyopathy -Morbid obesity  Recommendations -------------------------- -Proceed with colonoscopy today.  Risks (bleeding, infection, bowel perforation that could require surgery, sedation-related changes in cardiopulmonary systems), benefits (identification and possible treatment of source of symptoms, exclusion of certain causes of symptoms), and alternatives (watchful waiting, radiographic imaging studies, empiric medical treatment)  were explained to patient/family in detail and patient wishes to proceed.     LOS: 0 days   Kathi Der  MD, FACP 05/03/2023, 8:08 AM  Contact #  [phone]

## 2023-05-05 ENCOUNTER — Encounter (HOSPITAL_COMMUNITY): Payer: Self-pay | Admitting: Gastroenterology

## 2023-05-05 LAB — SURGICAL PATHOLOGY

## 2023-05-19 DIAGNOSIS — I429 Cardiomyopathy, unspecified: Secondary | ICD-10-CM | POA: Diagnosis not present

## 2023-05-19 DIAGNOSIS — N1832 Chronic kidney disease, stage 3b: Secondary | ICD-10-CM | POA: Diagnosis not present

## 2023-05-19 DIAGNOSIS — G4733 Obstructive sleep apnea (adult) (pediatric): Secondary | ICD-10-CM | POA: Diagnosis not present

## 2023-05-19 DIAGNOSIS — E1122 Type 2 diabetes mellitus with diabetic chronic kidney disease: Secondary | ICD-10-CM | POA: Diagnosis not present

## 2023-08-01 DIAGNOSIS — G4733 Obstructive sleep apnea (adult) (pediatric): Secondary | ICD-10-CM | POA: Diagnosis not present

## 2023-08-18 ENCOUNTER — Other Ambulatory Visit: Payer: Self-pay

## 2023-08-19 ENCOUNTER — Encounter: Payer: Self-pay | Admitting: Cardiology

## 2023-08-19 ENCOUNTER — Ambulatory Visit: Attending: Cardiology | Admitting: Cardiology

## 2023-08-19 VITALS — BP 124/80 | HR 81 | Ht 73.0 in | Wt 344.0 lb

## 2023-08-19 DIAGNOSIS — G4733 Obstructive sleep apnea (adult) (pediatric): Secondary | ICD-10-CM

## 2023-08-19 DIAGNOSIS — I428 Other cardiomyopathies: Secondary | ICD-10-CM | POA: Diagnosis not present

## 2023-08-19 DIAGNOSIS — I10 Essential (primary) hypertension: Secondary | ICD-10-CM

## 2023-08-19 NOTE — Progress Notes (Signed)
 Cardiology Office Note:    Date:  08/19/2023   ID:  [name] Sosa, DOB 12/13/1960, MRN [id number]  PCP:  Benedetta Bradley, MD  Cardiologist:  Nelia Balzarine, MD   Referring MD: Benedetta Bradley, *    ASSESSMENT:    1. Cardiomyopathy, nonischemic (HCC)   2. Primary hypertension   3. Nonischemic cardiomyopathy (HCC)   4. Obstructive sleep apnea   5. Morbid obesity (HCC)    PLAN:    In order of problems listed above:  Primary prevention stressed with the patient.  Importance of compliance with diet medication stressed and patient verbalized standing. Nonischemic cardiomyopathy: I discussed my findings with the patient at length.  Congestive heart failure education was given to the patient and he vocalized understanding and questions were answered to satisfaction.  He has not been careful with regular weight checks and diet and salt intake and I cautioned him about this Essential hypertension: Blood pressure is stable and diet was emphasized. Morbid obesity: Weight reduction stressed this is to do better.  Diet emphasized.  Risks of obesity explained. Cardiomyopathy: Ejection fraction assessment by echo reviewed and again electrophysiology evaluation was discussed at this time he is agreeable.  We will refer him to our electrophysiology colleagues whom he has seen in the past. Patient will be seen in follow-up appointment in 6 months or earlier if the patient has any concerns.    Medication Adjustments/Labs and Tests Ordered: Current medicines are reviewed at length with the patient today.  Concerns regarding medicines are outlined above.  Orders Placed This Encounter  Procedures   Basic Metabolic Panel (BMET)   CBC   TSH   Hepatic function panel   Lipid Profile   Ambulatory referral to Cardiac Electrophysiology   EKG 12-Lead   ECHOCARDIOGRAM COMPLETE   No orders of the defined types were placed in this encounter.    Chief Complaint  Patient presents with    Follow-up     History of Present Illness:    [name] Sosa is a 63 y.o. male.  Patient has past medical history of essential hypertension, mixed dyslipidemia and nonischemic cardiomyopathy.  He has refused defibrillator and electrophysiology evaluation.  He denies any chest pain orthopnea PND.  Past Medical History:  Diagnosis Date   Adenomatous polyp of colon 05/14/2020   Anemia    Anxiety    HX OF ANXIETY ATTACKS--CAN'T SLEEP ON BACK-FEELS LIKE HE CAN'T BREATHE   Apnea, sleep    SEVERE OSA PER STUDY 12/01/12   Arthritis    Bariatric surgery status 05/14/2020   Body mass index (BMI) 45.0-49.9, adult (HCC) 05/14/2020   Cancer (HCC)    HX OF NON HODGKIN'S LYMPHOMA -IN REMISSION   Cardiomyopathy (HCC) 04/27/2018   Cardiomyopathy, nonischemic (HCC)    DX 2003 CARDIAC CATH, ECHO 2006 SHOWED EF IMPROVED TO 45-50%--PER CARDIOLOGY OFFICE NOTES DR. Pauletta Boroughs FROM 01/20/10   CHF (congestive heart failure) (HCC) 2003   Chronic kidney disease    HX OF CHRONIC KIDNEY DISEASE STAGE III - EVALUATED AT [facility] IN THE PAST AND FELT TO BE RELATED TO NSAID'S AND CHEMO.  PT  STATES HE HAS NOT SEEN KIDNEY SPECIALIST AT [facility] IN OVER A YEAR.   Chronic rhinitis 05/14/2020   Chronic systolic heart failure (HCC)    CARDIOLOGIST IS DR. Pauletta Boroughs   CKD (chronic kidney disease), stage III (HCC) 12/08/2012   Claustrophobia    Complication of anesthesia    had muscle weakness and body aches  the day after the surgery   Diabetes (HCC) 04/27/2012   Diabetic renal disease (HCC) 05/14/2020   Diffuse large cell lymphoma in remission 04/27/2012   Gastro-esophageal reflux disease without esophagitis 05/14/2020   GERD (gastroesophageal reflux disease)    NOT TAKING ANY MEDS FOR REFLUX   History of adenomatous polyp of colon 05/14/2020   History of Lap Roux-en-Y gastric bypass 03/05/13 03/23/2013   History of lobectomy of thyroid  11/06/2021   Hydrocele 05/14/2020   Hyperlipidemia    Hypertension    IDDM  (insulin  dependent diabetes mellitus) 12/08/2012   Long term (current) use of insulin  (HCC) 05/14/2020   Lumbar spondylosis 04/02/2021   Morbid obesity (HCC)    Multinodular goiter (nontoxic) 10/16/2021   Multiple thyroid  nodules    HX OF NODULES=PT STATES BIOPSY-TOLD ALL OKAY   Non-Hodgkin lymphoma (HCC)    DX ABOUT 2007-TX'D WITH CHEMO-IN REMISSION   Nonischemic cardiomyopathy (HCC) 11/25/2017   Obesity 05/14/2020   Obstructive sleep apnea 12/08/2012   Primary localized osteoarthritis of pelvic region and thigh 04/02/2021   Primary localized osteoarthritis of right hip 12/29/2021   Primary osteoarthritis of right hip 12/29/2021   Pure hypercholesterolemia 05/14/2020   Sciatica, right side 05/14/2020   Shortness of breath    WALKING UPSTAIRS   Skin sensation disturbance 05/14/2020   Subclinical hypothyroidism 05/14/2020   Substernal thyroid  goiter 10/08/2021   Testicular hypofunction 05/14/2020   Thyroid  nodule 05/14/2020   Tracheal deviation 10/08/2021    Past Surgical History:  Procedure Laterality Date   BIOPSY  09/25/2019   Procedure: BIOPSY;  Surgeon: Felecia Hopper, MD;  Location: [facility];  Service: Gastroenterology;;   BIOPSY  05/03/2023   Procedure: BIOPSY;  Surgeon: Felecia Hopper, MD;  Location: [facility];  Service: Gastroenterology;;   COLONOSCOPY WITH PROPOFOL  N/A 01/09/2013   Procedure: COLONOSCOPY WITH PROPOFOL ;  Surgeon: Garrett Kallman, MD;  Location: [facility];  Service: Endoscopy;  Laterality: N/A;   COLONOSCOPY WITH PROPOFOL  N/A 09/25/2019   Procedure: COLONOSCOPY WITH PROPOFOL ;  Surgeon: Felecia Hopper, MD;  Location: [facility];  Service: Gastroenterology;  Laterality: N/A;   COLONOSCOPY WITH PROPOFOL  N/A 05/03/2023   Procedure: COLONOSCOPY WITH PROPOFOL ;  Surgeon: Felecia Hopper, MD;  Location: [facility];  Service: Gastroenterology;  Laterality: N/A;   GASTRIC ROUX-EN-Y N/A 03/05/2013   Procedure: LAPAROSCOPIC ROUX-EN-Y GASTRIC  BYPASS WITH UPPER ENDOSCOPY;  Surgeon: Fran Imus, MD;  Location: [facility];  Service: General;  Laterality: N/A;   HYDROCELE EXCISION  02/09/2011   Procedure: HYDROCELECTOMY ADULT;  Surgeon: Moise Anes, MD;  Location: [facility];  Service: Urology;  Laterality: Right;   POLYPECTOMY  09/25/2019   Procedure: POLYPECTOMY;  Surgeon: Felecia Hopper, MD;  Location: [facility];  Service: Gastroenterology;;   POLYPECTOMY  05/03/2023   Procedure: POLYPECTOMY;  Surgeon: Felecia Hopper, MD;  Location: [facility];  Service: Gastroenterology;;   THYROID  LOBECTOMY N/A 10/16/2021   Procedure: LEFT THYROID  LOBECTOMY;  Surgeon: Oralee Billow, MD;  Location: [facility];  Service: General;  Laterality: N/A;   TOTAL HIP ARTHROPLASTY Right 12/29/2021   Procedure: RIGHT TOTAL HIP ARTHROPLASTY ANTERIOR APPROACH;  Surgeon: Dayne Even, MD;  Location: [facility];  Service: Orthopedics;  Laterality: Right;   WISDOM TOOTH EXTRACTION      Current Medications: Current Meds  Medication Sig   acetaminophen  (TYLENOL ) 650 MG CR tablet Take 1,300 mg by mouth every 8 (eight) hours as needed for pain.   ALPRAZolam  (XANAX ) 0.5 MG tablet Take 0.5 mg by mouth 2 (two) times  daily as needed for anxiety.   aspirin  EC 81 MG tablet Take 81 mg by mouth daily. Swallow whole.   CALCIUM CITRATE PO Take 2 tablets by mouth 2 (two) times daily.   carvedilol  (COREG ) 25 MG tablet TAKE 1 TABLET BY MOUTH TWICE A DAY   FARXIGA  10 MG TABS tablet Take 10 mg by mouth daily.   HUMALOG KWIKPEN 200 UNIT/ML KwikPen Inject 20-25 Units into the skin 3 (three) times daily with meals. 20 units sq   Multiple Vitamins-Minerals (BARIATRIC MULTIVITAMINS/IRON PO) Take 1 capsule by mouth daily.   sacubitril -valsartan  (ENTRESTO ) 49-51 MG Take 1 tablet by mouth 2 (two) times daily.   SOLIQUA 100-33 UNT-MCG/ML SOPN Inject 30 Units into the skin at bedtime.   tirzepatide (MOUNJARO) 10 MG/0.5ML Pen Inject 10 mg into the skin once a week.   zolpidem   (AMBIEN ) 10 MG tablet Take 10 mg by mouth at bedtime.     Allergies:   Aciphex [rabeprazole sodium], Acyclovir and related, and Nsaids   Social History   Socioeconomic History   Marital status: Single    Spouse name: Not on file   Number of children: Not on file   Years of education: Not on file   Highest education level: Not on file  Occupational History   Not on file  Tobacco Use   Smoking status: Never   Smokeless tobacco: Never  Vaping Use   Vaping status: Never Used  Substance and Sexual Activity   Alcohol use: Yes    Comment: RARELY   Drug use: No   Sexual activity: Not Currently  Other Topics Concern   Not on file  Social History Narrative   Not on file   Social Drivers of Health   Financial Resource Strain: Not on file  Food Insecurity: No Food Insecurity (12/29/2021)   Hunger Vital Sign    Worried About Running Out of Food in the Last Year: Never true    Ran Out of Food in the Last Year: Never true  Transportation Needs: No Transportation Needs (12/29/2021)   PRAPARE - Administrator, Civil Service (Medical): No    Lack of Transportation (Non-Medical): No  Physical Activity: Not on file  Stress: Not on file  Social Connections: Not on file     Family History: The patient's He was adopted. Family history is unknown by patient.  ROS:   Please see the history of present illness.    All other systems reviewed and are negative.  EKGs/Labs/Other Studies Reviewed:    The following studies were reviewed today: .Aaron AasEKG Interpretation Date/Time:  Friday Aug 19 2023 16:32:47 EDT Ventricular Rate:  77 PR Interval:  166 QRS Duration:  86 QT Interval:  384 QTC Calculation: 434 R Axis:   -32  Text Interpretation: Normal sinus rhythm Left axis deviation Minimal voltage criteria for LVH, may be normal variant ( R in aVL ) When compared with ECG of 19-Nov-2022 16:07, No significant change was found Confirmed by Hillis Lu [phone]) on 08/19/2023  4:35:04 PM     Recent Labs: 12/07/2022: ALT 23; BUN 21; Creatinine, Ser 2.24; Hemoglobin 14.9; Platelets 255; Potassium 5.0; Sodium 138; TSH 1.770  Recent Lipid Panel    Component Value Date/Time   CHOL 174 12/07/2022 0819   TRIG 97 12/07/2022 0819   HDL 65 12/07/2022 0819   CHOLHDL 2.7 12/07/2022 0819   CHOLHDL 3.2 08/07/2013 0815   VLDL 19 08/07/2013 0815   LDLCALC 91 12/07/2022 0819    Physical  Exam:    VS:  BP 124/80   Pulse 81   Ht 6\' 1"  (1.854 m)   Wt (!) 344 lb (156 kg)   SpO2 94%   BMI 45.39 kg/m     Wt Readings from Last 3 Encounters:  08/19/23 (!) 344 lb (156 kg)  05/03/23 (!) 330 lb (149.7 kg)  11/19/22 (!) 346 lb (156.9 kg)     GEN: Patient is in no acute distress HEENT: Normal NECK: No JVD; No carotid bruits LYMPHATICS: No lymphadenopathy CARDIAC: Hear sounds regular, 2/6 systolic murmur at the apex. RESPIRATORY:  Clear to auscultation without rales, wheezing or rhonchi  ABDOMEN: Soft, non-tender, non-distended MUSCULOSKELETAL:  No edema; No deformity  SKIN: Warm and dry NEUROLOGIC:  Alert and oriented x 3 PSYCHIATRIC:  Normal affect   Signed, Nelia Balzarine, MD  08/19/2023 4:39 PM     [facility]

## 2023-08-19 NOTE — Patient Instructions (Addendum)
 Medication Instructions:  Your physician recommends that you continue on your current medications as directed. Please refer to the Current Medication list given to you today.  *If you need a refill on your cardiac medications before your next appointment, please call your pharmacy*  Lab Work: Your physician recommends that you return for lab work in:   Labs in the next few days: BMP, CBC, TSH, LFT, Lipids  If you have labs (blood work) drawn today and your tests are completely normal, you will receive your results only by: MyChart Message (if you have MyChart) OR A paper copy in the mail If you have any lab test that is abnormal or we need to change your treatment, we will call you to review the results.  Testing/Procedures: Your physician has requested that you have an echocardiogram. Echocardiography is a painless test that uses sound waves to create images of your heart. It provides your doctor with information about the size and shape of your heart and how well your heart's chambers and valves are working. This procedure takes approximately one hour. There are no restrictions for this procedure. Please do NOT wear cologne, perfume, aftershave, or lotions (deodorant is allowed). Please arrive 15 minutes prior to your appointment time.  Please note: We ask at that you not bring children with you during ultrasound (echo/ vascular) testing. Due to room size and safety concerns, children are not allowed in the ultrasound rooms during exams. Our front office staff cannot provide observation of children in our lobby area while testing is being conducted. An adult accompanying a patient to their appointment will only be allowed in the ultrasound room at the discretion of the ultrasound technician under special circumstances. We apologize for any inconvenience.   Follow-Up: At [facility], you and your health needs are our priority.  As part of our continuing mission to provide you with  exceptional heart care, our providers are all part of one team.  This team includes your primary Cardiologist (physician) and Advanced Practice Providers or APPs (Physician Assistants and Nurse Practitioners) who all work together to provide you with the care you need, when you need it.  Your next appointment:   9 month(s)  Provider:   Hillis Lu, MD    We recommend signing up for the patient portal called "MyChart".  Sign up information is provided on this After Visit Summary.  MyChart is used to connect with patients for Virtual Visits (Telemedicine).  Patients are able to view lab/test results, encounter notes, upcoming appointments, etc.  Non-urgent messages can be sent to your provider as well.   To learn more about what you can do with MyChart, go to [email or web address].   Other Instructions None

## 2023-08-26 DIAGNOSIS — I428 Other cardiomyopathies: Secondary | ICD-10-CM | POA: Diagnosis not present

## 2023-08-26 DIAGNOSIS — I10 Essential (primary) hypertension: Secondary | ICD-10-CM | POA: Diagnosis not present

## 2023-08-26 DIAGNOSIS — G4733 Obstructive sleep apnea (adult) (pediatric): Secondary | ICD-10-CM | POA: Diagnosis not present

## 2023-08-27 LAB — BASIC METABOLIC PANEL WITH GFR
BUN/Creatinine Ratio: 9 — ABNORMAL LOW (ref 10–24)
BUN: 20 mg/dL (ref 8–27)
CO2: 20 mmol/L (ref 20–29)
Calcium: 9.1 mg/dL (ref 8.6–10.2)
Chloride: 104 mmol/L (ref 96–106)
Creatinine, Ser: 2.32 mg/dL — ABNORMAL HIGH (ref 0.76–1.27)
Glucose: 106 mg/dL — ABNORMAL HIGH (ref 70–99)
Potassium: 5 mmol/L (ref 3.5–5.2)
Sodium: 138 mmol/L (ref 134–144)
eGFR: 31 mL/min/{1.73_m2} — ABNORMAL LOW (ref >59)

## 2023-08-27 LAB — HEPATIC FUNCTION PANEL
ALT: 19 IU/L (ref 0–44)
AST: 21 IU/L (ref 0–40)
Albumin: 4.3 g/dL (ref 3.9–4.9)
Alkaline Phosphatase: 67 IU/L (ref 44–121)
Bilirubin Total: 0.4 mg/dL (ref 0.0–1.2)
Bilirubin, Direct: 0.18 mg/dL (ref 0.00–0.40)
Total Protein: 6.8 g/dL (ref 6.0–8.5)

## 2023-08-27 LAB — CBC
Hematocrit: 43.7 % (ref 37.5–51.0)
Hemoglobin: 14.3 g/dL (ref 13.0–17.7)
MCH: 28.6 pg (ref 26.6–33.0)
MCHC: 32.7 g/dL (ref 31.5–35.7)
MCV: 87 fL (ref 79–97)
Platelets: 237 10*3/uL (ref 150–450)
RBC: 5 x10E6/uL (ref 4.14–5.80)
RDW: 13.7 % (ref 11.6–15.4)
WBC: 4.3 10*3/uL (ref 3.4–10.8)

## 2023-08-27 LAB — LIPID PANEL
Chol/HDL Ratio: 2.2 ratio (ref 0.0–5.0)
Cholesterol, Total: 163 mg/dL (ref 100–199)
HDL: 73 mg/dL (ref >39)
LDL Chol Calc (NIH): 77 mg/dL (ref 0–99)
Triglycerides: 68 mg/dL (ref 0–149)
VLDL Cholesterol Cal: 13 mg/dL (ref 5–40)

## 2023-08-27 LAB — TSH: TSH: 1.19 u[IU]/mL (ref 0.450–4.500)

## 2023-08-30 ENCOUNTER — Ambulatory Visit: Payer: Self-pay | Admitting: Cardiology

## 2023-09-12 DIAGNOSIS — N1832 Chronic kidney disease, stage 3b: Secondary | ICD-10-CM | POA: Diagnosis not present

## 2023-09-12 DIAGNOSIS — E1122 Type 2 diabetes mellitus with diabetic chronic kidney disease: Secondary | ICD-10-CM | POA: Diagnosis not present

## 2023-09-12 DIAGNOSIS — G4733 Obstructive sleep apnea (adult) (pediatric): Secondary | ICD-10-CM | POA: Diagnosis not present

## 2023-09-12 DIAGNOSIS — I429 Cardiomyopathy, unspecified: Secondary | ICD-10-CM | POA: Diagnosis not present

## 2023-09-26 ENCOUNTER — Ambulatory Visit (INDEPENDENT_AMBULATORY_CARE_PROVIDER_SITE_OTHER): Admitting: Podiatry

## 2023-09-26 ENCOUNTER — Encounter: Payer: Self-pay | Admitting: Podiatry

## 2023-09-26 DIAGNOSIS — M79675 Pain in left toe(s): Secondary | ICD-10-CM

## 2023-09-26 DIAGNOSIS — L600 Ingrowing nail: Secondary | ICD-10-CM | POA: Diagnosis not present

## 2023-09-26 DIAGNOSIS — M79674 Pain in right toe(s): Secondary | ICD-10-CM

## 2023-09-26 DIAGNOSIS — E1142 Type 2 diabetes mellitus with diabetic polyneuropathy: Secondary | ICD-10-CM

## 2023-09-26 DIAGNOSIS — B351 Tinea unguium: Secondary | ICD-10-CM | POA: Diagnosis not present

## 2023-09-26 NOTE — Patient Instructions (Signed)

## 2023-09-26 NOTE — Progress Notes (Addendum)
 Subjective:  Patient ID: [name] Sosa, male    DOB: 1960/11/06,   MRN: [id number]  Chief Complaint  Patient presents with   Ingrown Toenail    Bilateral ingrown toenails. Chronic issue. 2 pain. IDDM A1C 6.1    63 y.o. male presents for concern of bilateral ingrown toenails. He has had ingrowns removed years ago and had no issue for a while but relates they have returned and hoping to hvae them removed..concern of thickened elongated and painful nails that are difficult to trim. Requesting to have them trimmed today.  Relates burning and tingling in their feet. Patient is diabetic and last A1c was  Lab Results  Component Value Date   HGBA1C 6.7 (H) 12/07/2022   .   PCP:  Charlott Dorn LABOR, MD    . Denies any other pedal complaints. Denies n/v/f/c.   Past Medical History:  Diagnosis Date   Adenomatous polyp of colon 05/14/2020   Anemia    Anxiety    HX OF ANXIETY ATTACKS--CAN'T SLEEP ON BACK-FEELS LIKE HE CAN'T BREATHE   Apnea, sleep    SEVERE OSA PER STUDY 12/01/12   Arthritis    Bariatric surgery status 05/14/2020   Body mass index (BMI) 45.0-49.9, adult (HCC) 05/14/2020   Cancer (HCC)    HX OF NON HODGKIN'S LYMPHOMA -IN REMISSION   Cardiomyopathy (HCC) 04/27/2018   Cardiomyopathy, nonischemic (HCC)    DX 2003 CARDIAC CATH, ECHO 2006 SHOWED EF IMPROVED TO 45-50%--PER CARDIOLOGY OFFICE NOTES DR. HILARIO SHARPS FROM 01/20/10   CHF (congestive heart failure) (HCC) 2003   Chronic kidney disease    HX OF CHRONIC KIDNEY DISEASE STAGE III - EVALUATED AT [facility] IN THE PAST AND FELT TO BE RELATED TO NSAID'S AND CHEMO.  PT  STATES HE HAS NOT SEEN KIDNEY SPECIALIST AT [facility] IN OVER A YEAR.   Chronic rhinitis 05/14/2020   Chronic systolic heart failure (HCC)    CARDIOLOGIST IS DR. HILARIO SHARPS   CKD (chronic kidney disease), stage III (HCC) 12/08/2012   Claustrophobia    Complication of anesthesia    had muscle weakness and body aches the day after the surgery   Diabetes (HCC)  04/27/2012   Diabetic renal disease (HCC) 05/14/2020   Diffuse large cell lymphoma in remission 04/27/2012   Gastro-esophageal reflux disease without esophagitis 05/14/2020   GERD (gastroesophageal reflux disease)    NOT TAKING ANY MEDS FOR REFLUX   History of adenomatous polyp of colon 05/14/2020   History of Lap Roux-en-Y gastric bypass 03/05/13 03/23/2013   History of lobectomy of thyroid  11/06/2021   Hydrocele 05/14/2020   Hyperlipidemia    Hypertension    IDDM (insulin  dependent diabetes mellitus) 12/08/2012   Long term (current) use of insulin  (HCC) 05/14/2020   Lumbar spondylosis 04/02/2021   Morbid obesity (HCC)    Multinodular goiter (nontoxic) 10/16/2021   Multiple thyroid  nodules    HX OF NODULES=PT STATES BIOPSY-TOLD ALL OKAY   Non-Hodgkin lymphoma (HCC)    DX ABOUT 2007-TX'D WITH CHEMO-IN REMISSION   Nonischemic cardiomyopathy (HCC) 11/25/2017   Obesity 05/14/2020   Obstructive sleep apnea 12/08/2012   Primary localized osteoarthritis of pelvic region and thigh 04/02/2021   Primary localized osteoarthritis of right hip 12/29/2021   Primary osteoarthritis of right hip 12/29/2021   Pure hypercholesterolemia 05/14/2020   Sciatica, right side 05/14/2020   Shortness of breath    WALKING UPSTAIRS   Skin sensation disturbance 05/14/2020   Subclinical hypothyroidism 05/14/2020   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 CFT <3 seconds. Normal hair growth on digits. No edema.  Skin. No lacerations or abrasions bilateral feet. Bilateral medial hallux with incurvation noted. Tender to palpation. No erythema edema or purulence noted. Remaing nails 2-5 bilateral are thickened and elongated with subungual debris Musculoskeletal: MMT 5/5 bilateral lower extremities in DF, PF, Inversion and Eversion. Deceased ROM in DF  of ankle joint.  Neurological: Sensation intact to light touch. Protective sensation intact.   Assessment:   1. Ingrown right greater toenail   2. Ingrown left greater toenail      Plan:  Patient was evaluated and treated and all questions answered. Discussed ingrown toenails etiology and treatment options including procedure for removal vs conservative care.  Patient requesting removal of ingrown nail today. Procedure below.  Discussed procedure and post procedure care and patient expressed understanding.  Will follow-up in 2 weeks for nail check or sooner if any problems arise.   -Discussed and educated patient on diabetic foot care, especially with  regards to the vascular, neurological and musculoskeletal systems.  -Stressed the importance of good glycemic control and the detriment of not  controlling glucose levels in relation to the foot. -Discussed supportive shoes at all times and checking feet regularly.  -Mechanically debrided all nails 1-5 bilateral using sterile nail nipper and filed with dremel without incident  -Answered all patient questions -Patient to return  in 3 months for at risk foot care -Patient advised to call the office if any problems or questions arise in the meantime.    Procedure:  Procedure: partial Nail Avulsion of bilaterally hallux medial nail border.  Surgeon: Asberry Failing, DPM  Pre-op Dx: Ingrown toenail without infection Post-op: Same  Place of Surgery: Office exam room.  Indications for surgery: Painful and ingrown toenail.    The patient is requesting removal of nail with  chemical matrixectomy. Risks and complications were discussed with the patient for which they understand and written consent was obtained. Under sterile conditions a total of 3 mL of  1% lidocaine  plain was infiltrated in a hallux block fashion. Once anesthetized, the skin was prepped in sterile fashion. A tourniquet was then applied. Next the bialtearl aspect of hallux  nail border was then sharply excised making sure to remove the entire offending nail border.  Next phenol was then applied under standard conditions to permanently destroy the matrix and copiously irrigated. Silvadene was applied. A dry sterile dressing was applied. After application of the dressing the tourniquet was removed and there is found to be an immediate capillary refill time to the digit. The patient tolerated the procedure well without any complications. Post procedure instructions were discussed the patient for which he verbally understood. Follow-up in two weeks for nail check or sooner if any problems are to arise. Discussed signs/symptoms of infection and directed to call the office immediately should any occur or go directly to the emergency room. In the meantime, encouraged to call the office with any questions, concerns, changes symptoms.    Asberry Failing, DPM

## 2023-10-04 NOTE — Progress Notes (Unsigned)
  Electrophysiology Office Note:   Date:  10/04/2023  ID:  [name] Sosa, DOB 1960-04-25, MRN [id number]  Primary Cardiologist: Jennifer JONELLE Crape, MD Primary Heart Failure: None Electrophysiologist: Ayvin Lipinski Gladis Norton, MD  {Click to update primary MD,subspecialty MD or APP then REFRESH:1}    History of Present Illness:   [name] Sosa is a 63 y.o. male with h/o chronic systolic heart failure due to nonischemic cardiomyopathy, hypertension, sleep apnea, obesity seen today for  for Electrophysiology evaluation of heart failure at the request of Rajan Revankar.    Today, denies symptoms of palpitations, chest pain, dyspnea, orthopnea, PND, lower extremity edema, claudication, dizziness, presyncope, syncope, bleeding, or neurologic sequela. The patient is tolerating medications without difficulties. ***   Review of systems complete and found to be negative unless listed in HPI.   EP Information / Studies Reviewed:    {EKGtoday:28818}      Risk Assessment/Calculations:           Physical Exam:   VS:  There were no vitals taken for this visit.   Wt Readings from Last 3 Encounters:  08/19/23 (!) 344 lb (156 kg)  05/03/23 (!) 330 lb (149.7 kg)  11/19/22 (!) 346 lb (156.9 kg)     GEN: Well nourished, well developed in no acute distress NECK: No JVD; No carotid bruits CARDIAC: {EPRHYTHM:28826}, no murmurs, rubs, gallops RESPIRATORY:  Clear to auscultation without rales, wheezing or rhonchi  ABDOMEN: Soft, non-tender, non-distended EXTREMITIES:  No edema; No deformity   ASSESSMENT AND PLAN:    1.  Chronic systolic heart failure: Due to nonischemic cardiomyopathy.  On optimal medical therapy per primary cardiology.  His ejection fraction remains low.  He would benefit from ICD implant for primary prevention.***  2.  Hypertension:***  3.  Obstructive sleep apnea: CPAP compliance encouraged  4.  Morbid obesity: Lifestyle modification encouraged  Follow up with  {EPMDS:28135::EP Team} {EPFOLLOW LE:71826}  Signed, Jonquil Stubbe Gladis Norton, MD

## 2023-10-05 ENCOUNTER — Ambulatory Visit: Attending: Cardiology | Admitting: Cardiology

## 2023-10-05 ENCOUNTER — Encounter: Payer: Self-pay | Admitting: Cardiology

## 2023-10-05 VITALS — BP 115/78 | HR 85 | Ht 73.0 in | Wt 337.0 lb

## 2023-10-05 DIAGNOSIS — I10 Essential (primary) hypertension: Secondary | ICD-10-CM | POA: Diagnosis not present

## 2023-10-05 DIAGNOSIS — I5022 Chronic systolic (congestive) heart failure: Secondary | ICD-10-CM | POA: Diagnosis not present

## 2023-10-05 DIAGNOSIS — G4733 Obstructive sleep apnea (adult) (pediatric): Secondary | ICD-10-CM | POA: Diagnosis not present

## 2023-10-05 NOTE — Patient Instructions (Signed)
 Medication Instructions:  Your physician recommends that you continue on your current medications as directed. Please refer to the Current Medication list given to you today.  *If you need a refill on your cardiac medications before your next appointment, please call your pharmacy*  Lab Work: None ordered  If you have any lab test that is abnormal or we need to change your treatment, we will call you to review the results.  Testing/Procedures: Your physician has recommended that you have a defibrillator inserted. An implantable cardioverter defibrillator (ICD) is a small device that is placed in your chest or, in rare cases, your abdomen. This device uses electrical pulses or shocks to help control life-threatening, irregular heartbeats that could lead the heart to suddenly stop beating (sudden cardiac arrest). Leads are attached to the ICD that goes into your heart. This is done in the hospital and usually requires an overnight stay.  Please call the office and let us  know your decision.   Follow-Up At [facility], you and your health needs are our priority.  As part of our continuing mission to provide you with exceptional heart care, our providers are all part of one team.  This team includes your primary Cardiologist (physician) and Advanced Practice Providers or APPs (Physician Assistants and Nurse Practitioners) who all work together to provide you with the care you need, when you need it.  Your next appointment:   to be determined  Provider:   Soyla Norton, MD      Thank you for choosing [facility]!!   Maeola Domino, RN [phone]   Other Instructions  Cardioverter Defibrillator Implantation An implantable cardioverter defibrillator (ICD) is a device that detects abnormal heart rhythms, also called arrhythmias. When the ICD senses a heart rhythm that's not normal, it sends an electrical signal to get the heartbeat back into a normal rhythm. In the  implantation surgery, the ICD is placed under the skin in your chest or abdomen. An ICD has a battery and a small computer called a pulse generator. It also has wires, called leads, that go into the heart. The ICD detects and corrects two types of dangerous heart rhythms: Ventricular tachycardia. This is a very fast heart rhythm in the lower chambers of the heart. These chambers are called the ventricles. Ventricular fibrillation. This is when the ventricles contract in an uncoordinated way. Your health care provider may suggest an ICD if: You had an abnormal heart rhythm that started in the ventricles. Your heart has damage from a disease or heart condition. Your heart muscle is weak. You had a cardiac arrest before. You have: A congenital heart defect. This is a heart problem you were born with. You have other health problems that can affect your heart's electrical system. Tell a health care provider about: Any allergies you have. All medicines you're taking. These include vitamins, herbs, eye drops, creams, and over-the-counter medicines. Any problems you or family members have had with anesthesia. Any bleeding problems you have. Any surgeries you have had. Any medical conditions you have. Whether you're pregnant or may be pregnant. What are the risks? Your provider will talk with you about risks. These may include: Infection. Bleeding. Allergic reactions to medicines. Blood clots. Swelling or bruising. Damage to nearby structures or organs. These might be nerves, lungs, blood vessels, or the heart where parts of the ICD are placed. What happens before the procedure? When to stop eating and drinking Follow instructions from your provider about what you may eat  and drink. These may include: 8 hours before your procedure Stop eating most foods. Do not eat meat, fried foods, or fatty foods. Eat only light foods, such as toast or crackers. All liquids are okay except energy drinks  and alcohol. 6 hours before your procedure Stop eating. Drink only clear liquids, such as water , clear fruit juice, black coffee, plain tea, and sports drinks. Do not drink energy drinks or alcohol. 2 hours before your procedure Stop drinking all liquids. You may be allowed to take medicines with small sips of water . If you don't follow your provider's instructions, your procedure may be delayed or canceled. Medicines Ask your provider about: Changing or stopping your regular medicines. These include any diabetes medicines or blood thinners you take. Taking medicines such as aspirin  and ibuprofen. These medicines can thin your blood. Do not take them unless your provider tells you to. Taking over-the-counter medicines, vitamins, herbs, and supplements. Tests You may have an exam or testing. These may include: Blood tests. Electrocardiogram (ECG). This test records the electrical signals in your heart. Imaging tests, such as a chest X-ray. Echocardiogram. This uses sound waves to make pictures of your heart. An event monitor or Holter monitor to wear at home. These track your heart rhythm. General instructions Do not use any products that contain nicotine or tobacco for at least 4 weeks before the procedure. These products include cigarettes, chewing tobacco, and vaping devices, such as e-cigarettes. If you need help quitting, ask your provider. Ask your provider: How your procedure site will be marked. What steps will be taken to help prevent infection. These may include: Removing hair at the surgery site. Washing skin with a soap that kills germs. Taking antibiotics. If you'll be going home right after the procedure, plan to have a responsible adult: Take you home from the hospital or clinic. You won't be allowed to drive. Care for you for the time you are told. What happens during the procedure?  Monitors will be put on your body. They will be used to check your heart rate, blood  pressure, and oxygen level. A pair of sticky pads, called defibrillator pads, may be placed on your back and chest. These pads can pace your heart as needed during the procedure. An IV will be inserted into one of your veins. You may be given: A sedative. This helps you relax. Anesthesia. This keeps you from feeling pain. It will make you fall asleep for surgery. A small incision will be made to create a deep pocket under the skin of your chest or abdomen. Leads will be guided through a blood vessel into your heart and attached to your heart muscles. An X-ray machine called a fluoroscope will be used to help guide the leads. Depending on the ICD, the leads may go into one ventricle, or they may go into both ventricles and into an upper chamber of the heart. The other end of the leads will be attached to the ICD's pulse generator. The pulse generator will be placed into the pocket under your skin. The ICD will be tested, and your provider will program the ICD for the condition being treated. The incision will be closed with stitches, skin glue, tape strips, or staples. A bandage will be placed over the incision. The procedure may vary among providers and hospitals. What happens after the procedure? Your blood pressure, heart rate, breathing rate, and blood oxygen level will be monitored until you leave the hospital or clinic. A chest X-ray will  be done to check the ICD. Do not raise your arm higher than your shoulder for as long as told. This is usually at least 6 weeks. You may be given an ID card that shows you have an ICD. You'll be given a remote home monitoring device to use with your ICD. It allows your device to communicate with your provider. Do not drive until your provider says it's safe. This information is not intended to replace advice given to you by your health care provider. Make sure you discuss any questions you have with your health care provider. Document Revised: 05/27/2022  Document Reviewed: 05/27/2022 Elsevier Patient Education  2024 [employer].

## 2023-10-06 ENCOUNTER — Ambulatory Visit (HOSPITAL_COMMUNITY)
Admission: RE | Admit: 2023-10-06 | Discharge: 2023-10-06 | Disposition: A | Discharge status: Home / Self Care | Source: Ambulatory Visit | Attending: Cardiology | Admitting: Cardiology

## 2023-10-06 DIAGNOSIS — I10 Essential (primary) hypertension: Secondary | ICD-10-CM

## 2023-10-06 DIAGNOSIS — G4733 Obstructive sleep apnea (adult) (pediatric): Secondary | ICD-10-CM | POA: Diagnosis not present

## 2023-10-06 DIAGNOSIS — I428 Other cardiomyopathies: Secondary | ICD-10-CM

## 2023-10-06 LAB — ECHOCARDIOGRAM COMPLETE
Area-P 1/2: 8.52 cm2
Est EF: <20
S' Lateral: 6.18 cm

## 2023-10-06 MED ORDER — PERFLUTREN LIPID MICROSPHERE
1.0000 mL | INTRAVENOUS | Status: AC | PRN
  Administered 2023-10-06: 3 mL via INTRAVENOUS

## 2023-10-17 ENCOUNTER — Ambulatory Visit: Admitting: Podiatry

## 2023-10-17 DIAGNOSIS — G4733 Obstructive sleep apnea (adult) (pediatric): Secondary | ICD-10-CM | POA: Diagnosis not present

## 2023-10-21 DIAGNOSIS — I428 Other cardiomyopathies: Secondary | ICD-10-CM

## 2023-10-25 ENCOUNTER — Encounter: Payer: Self-pay | Admitting: Cardiology

## 2023-10-25 DIAGNOSIS — I428 Other cardiomyopathies: Secondary | ICD-10-CM

## 2023-10-25 MED ORDER — SACUBITRIL-VALSARTAN 97-103 MG PO TABS: 1.0000 | ORAL_TABLET | Freq: Two times a day (BID) | ORAL | 3 refills | Status: AC

## 2023-10-26 ENCOUNTER — Other Ambulatory Visit: Payer: Self-pay | Admitting: Cardiology

## 2023-10-28 NOTE — Telephone Encounter (Signed)
 Pt had LM on my phone requesting a call back to schedule his ICD Implant. I returned his call and had to [facility] for him to call me back.

## 2023-10-31 DIAGNOSIS — G4733 Obstructive sleep apnea (adult) (pediatric): Secondary | ICD-10-CM | POA: Diagnosis not present

## 2023-11-11 DIAGNOSIS — I428 Other cardiomyopathies: Secondary | ICD-10-CM | POA: Diagnosis not present

## 2023-11-11 NOTE — Telephone Encounter (Signed)
 Pt is scheduled for ICD Implant with Dr. Inocencio on 10/10 at 3:00 pm.  He will get updated labs done at [facility] in [place] between 9/12-9/30.  I went over details of medications he will need to hold for procedure and ask him to pay close attention to those on his Instruction letter. He understood these instructions.   Instruction letter has been sent via MyChart per pt's request.

## 2023-11-11 NOTE — Addendum Note (Signed)
 Addended by: Janit Cutter on: 11/11/2023 11:27 AM   Modules accepted: Orders

## 2023-11-12 LAB — BASIC METABOLIC PANEL WITH GFR
BUN/Creatinine Ratio: 7 — ABNORMAL LOW (ref 10–24)
BUN: 17 mg/dL (ref 8–27)
CO2: 19 mmol/L — ABNORMAL LOW (ref 20–29)
Calcium: 9.2 mg/dL (ref 8.6–10.2)
Chloride: 105 mmol/L (ref 96–106)
Creatinine, Ser: 2.33 mg/dL — ABNORMAL HIGH (ref 0.76–1.27)
Glucose: 98 mg/dL (ref 70–99)
Potassium: 4.7 mmol/L (ref 3.5–5.2)
Sodium: 139 mmol/L (ref 134–144)
eGFR: 30 mL/min/1.73 — ABNORMAL LOW (ref >59)

## 2023-11-16 ENCOUNTER — Ambulatory Visit: Payer: Self-pay | Admitting: Cardiology

## 2023-11-18 DIAGNOSIS — R29818 Other symptoms and signs involving the nervous system: Secondary | ICD-10-CM | POA: Diagnosis not present

## 2023-11-18 DIAGNOSIS — M47817 Spondylosis without myelopathy or radiculopathy, lumbosacral region: Secondary | ICD-10-CM | POA: Diagnosis not present

## 2023-11-18 DIAGNOSIS — M47816 Spondylosis without myelopathy or radiculopathy, lumbar region: Secondary | ICD-10-CM | POA: Diagnosis not present

## 2023-11-18 DIAGNOSIS — G8929 Other chronic pain: Secondary | ICD-10-CM | POA: Diagnosis not present

## 2023-11-18 DIAGNOSIS — M4807 Spinal stenosis, lumbosacral region: Secondary | ICD-10-CM | POA: Diagnosis not present

## 2023-11-18 DIAGNOSIS — M48062 Spinal stenosis, lumbar region with neurogenic claudication: Secondary | ICD-10-CM | POA: Diagnosis not present

## 2023-11-18 DIAGNOSIS — M5416 Radiculopathy, lumbar region: Secondary | ICD-10-CM | POA: Diagnosis not present

## 2023-11-18 DIAGNOSIS — M5442 Lumbago with sciatica, left side: Secondary | ICD-10-CM | POA: Diagnosis not present

## 2023-11-18 DIAGNOSIS — M5441 Lumbago with sciatica, right side: Secondary | ICD-10-CM | POA: Diagnosis not present

## 2023-11-18 DIAGNOSIS — M545 Low back pain, unspecified: Secondary | ICD-10-CM | POA: Diagnosis not present

## 2023-11-28 ENCOUNTER — Other Ambulatory Visit: Payer: Self-pay | Admitting: Orthopedic Surgery

## 2023-11-28 DIAGNOSIS — M4807 Spinal stenosis, lumbosacral region: Secondary | ICD-10-CM

## 2023-12-01 DIAGNOSIS — R2689 Other abnormalities of gait and mobility: Secondary | ICD-10-CM | POA: Diagnosis not present

## 2023-12-01 DIAGNOSIS — M6281 Muscle weakness (generalized): Secondary | ICD-10-CM | POA: Diagnosis not present

## 2023-12-01 DIAGNOSIS — M79605 Pain in left leg: Secondary | ICD-10-CM | POA: Diagnosis not present

## 2023-12-02 ENCOUNTER — Telehealth (HOSPITAL_COMMUNITY): Payer: Self-pay

## 2023-12-02 NOTE — Telephone Encounter (Signed)
 Spoke with patient to complete pre-procedure call.     Health status review:  Any new medical conditions, recent signs of acute illness or been started on antibiotics? No Any recent hospitalizations or surgeries? No Any new medications started since pre-op visit? No  Follow all medication instructions prior to procedure or the procedure may be rescheduled:    HOLD: Tirzepatide (Mounjaro) for 1 week prior to the procedure. Last dose on Friday, September 26. HOLD: Dapagliflozin  (Farxiga ) for 3 days prior to the procedure. Last dose on Monday, October 06.   Humalog (Insulin ): No bedtime dose the day before your procedure. The day of your procedure if your CBG is greater than 220 mg/dL, you may take 1/2 of your usual dose.  Soliqua (Insulin ): HOLD the night time dose on the day before your procedure.  HOLD Aspirin  the morning of your procedure. On the morning of your procedure, take all other morning medications not discussed with a small sip of water . The night before your procedure and the morning of your procedure, wash thoroughly with the CHG surgical soap from the neck down, paying special attention to the area where your procedure will be performed.  You may have a LIGHT breakfast prior to 7:00am on the morning of your procedure. Nothing to eat or drink after 7:00am.  Pre-procedure testing scheduled: lab work by September 19.  Confirmed patient is scheduled for  Implantable cardioverter defibrilator (ICD) on Friday, October 10 with Dr. Soyla Norton. Instructed patient to arrive at the Main Entrance A at [facility]: [address] [place], [place] [address] and check in at Admitting at 1:00 PM.  Advised of plan to go home the same day and will only stay overnight if medically necessary. You MUST have a responsible adult to drive you home and MUST be with you the first 24 hours after you arrive home or your procedure could be cancelled.  Informed patient a nurse will call a day  before the procedure to confirm arrival time and ensure instructions are followed.  Patient verbalized understanding to information provided and is agreeable to proceed with procedure.   Advised patient to contact RN Navigator at [phone], to inform of any new medications started after call or concerns prior to procedure.

## 2023-12-03 ENCOUNTER — Inpatient Hospital Stay: Admission: RE | Admit: 2023-12-03 | Source: Ambulatory Visit

## 2023-12-05 DIAGNOSIS — M79605 Pain in left leg: Secondary | ICD-10-CM | POA: Diagnosis not present

## 2023-12-05 DIAGNOSIS — M6281 Muscle weakness (generalized): Secondary | ICD-10-CM | POA: Diagnosis not present

## 2023-12-05 DIAGNOSIS — R2689 Other abnormalities of gait and mobility: Secondary | ICD-10-CM | POA: Diagnosis not present

## 2023-12-10 ENCOUNTER — Other Ambulatory Visit

## 2023-12-12 DIAGNOSIS — M79605 Pain in left leg: Secondary | ICD-10-CM | POA: Diagnosis not present

## 2023-12-12 DIAGNOSIS — R2689 Other abnormalities of gait and mobility: Secondary | ICD-10-CM | POA: Diagnosis not present

## 2023-12-12 DIAGNOSIS — M6281 Muscle weakness (generalized): Secondary | ICD-10-CM | POA: Diagnosis not present

## 2023-12-13 ENCOUNTER — Ambulatory Visit
Admission: RE | Admit: 2023-12-13 | Discharge: 2023-12-13 | Disposition: A | Discharge status: Home / Self Care | Source: Ambulatory Visit | Attending: Orthopedic Surgery | Admitting: Orthopedic Surgery

## 2023-12-13 DIAGNOSIS — M4807 Spinal stenosis, lumbosacral region: Secondary | ICD-10-CM

## 2023-12-16 DIAGNOSIS — I428 Other cardiomyopathies: Secondary | ICD-10-CM | POA: Diagnosis not present

## 2023-12-17 LAB — BASIC METABOLIC PANEL WITH GFR
BUN/Creatinine Ratio: 8 — ABNORMAL LOW (ref 10–24)
BUN: 18 mg/dL (ref 8–27)
CO2: 20 mmol/L (ref 20–29)
Calcium: 9 mg/dL (ref 8.6–10.2)
Chloride: 103 mmol/L (ref 96–106)
Creatinine, Ser: 2.13 mg/dL — ABNORMAL HIGH (ref 0.76–1.27)
Glucose: 123 mg/dL — ABNORMAL HIGH (ref 70–99)
Potassium: 5.2 mmol/L (ref 3.5–5.2)
Sodium: 139 mmol/L (ref 134–144)
eGFR: 34 mL/min/1.73 — ABNORMAL LOW (ref >59)

## 2023-12-17 LAB — CBC
Hematocrit: 42.7 % (ref 37.5–51.0)
Hemoglobin: 13.6 g/dL (ref 13.0–17.7)
MCH: 28.4 pg (ref 26.6–33.0)
MCHC: 31.9 g/dL (ref 31.5–35.7)
MCV: 89 fL (ref 79–97)
Platelets: 211 x10E3/uL (ref 150–450)
RBC: 4.79 x10E6/uL (ref 4.14–5.80)
RDW: 13.1 % (ref 11.6–15.4)
WBC: 5.4 x10E3/uL (ref 3.4–10.8)

## 2023-12-20 ENCOUNTER — Encounter: Payer: Self-pay | Admitting: Cardiology

## 2023-12-22 DIAGNOSIS — M79605 Pain in left leg: Secondary | ICD-10-CM | POA: Diagnosis not present

## 2023-12-22 DIAGNOSIS — R2689 Other abnormalities of gait and mobility: Secondary | ICD-10-CM | POA: Diagnosis not present

## 2023-12-22 DIAGNOSIS — M6281 Muscle weakness (generalized): Secondary | ICD-10-CM | POA: Diagnosis not present

## 2023-12-29 NOTE — Pre-Procedure Instructions (Signed)
Attempted to call patient  regarding procedure instructions.  Left voicemail on the following items: Arrival time 1200 Nothing to eat or drink after midnight No meds AM of procedure Responsible person to drive you home and stay with you for 24 hrs Wash with special soap night before and morning of procedure

## 2023-12-30 ENCOUNTER — Ambulatory Visit (HOSPITAL_COMMUNITY)
Admission: RE | Admit: 2023-12-30 | Discharge: 2023-12-30 | Disposition: A | Discharge status: Home / Self Care | Attending: Cardiology | Admitting: Cardiology

## 2023-12-30 ENCOUNTER — Ambulatory Visit (HOSPITAL_COMMUNITY)

## 2023-12-30 ENCOUNTER — Ambulatory Visit (HOSPITAL_COMMUNITY)
Admission: RE | Disposition: A | Discharge status: Home / Self Care | Payer: Self-pay | Source: Home / Self Care | Attending: Cardiology

## 2023-12-30 ENCOUNTER — Other Ambulatory Visit: Payer: Self-pay

## 2023-12-30 DIAGNOSIS — I5022 Chronic systolic (congestive) heart failure: Secondary | ICD-10-CM | POA: Diagnosis not present

## 2023-12-30 DIAGNOSIS — I429 Cardiomyopathy, unspecified: Secondary | ICD-10-CM

## 2023-12-30 DIAGNOSIS — I255 Ischemic cardiomyopathy: Secondary | ICD-10-CM | POA: Insufficient documentation

## 2023-12-30 DIAGNOSIS — I428 Other cardiomyopathies: Secondary | ICD-10-CM | POA: Insufficient documentation

## 2023-12-30 DIAGNOSIS — I110 Hypertensive heart disease with heart failure: Secondary | ICD-10-CM | POA: Diagnosis not present

## 2023-12-30 LAB — GLUCOSE, CAPILLARY: Glucose-Capillary: 121 mg/dL — ABNORMAL HIGH (ref 70–99)

## 2023-12-30 SURGERY — ICD IMPLANT

## 2023-12-30 MED ORDER — LIDOCAINE HCL (PF) 1 % IJ SOLN
INTRAMUSCULAR | Status: AC
  Filled 2023-12-30: qty 60

## 2023-12-30 MED ORDER — FENTANYL CITRATE (PF) 100 MCG/2ML IJ SOLN
INTRAMUSCULAR | Status: AC
  Filled 2023-12-30: qty 2

## 2023-12-30 MED ORDER — SODIUM CHLORIDE 0.9 % IV SOLN: INTRAVENOUS | Status: DC

## 2023-12-30 MED ORDER — HEPARIN (PORCINE) IN NACL 1000-0.9 UT/500ML-% IV SOLN
INTRAVENOUS | Status: DC | PRN
  Administered 2023-12-30: 500 mL

## 2023-12-30 MED ORDER — ACETAMINOPHEN 325 MG PO TABS: 325.0000 mg | ORAL_TABLET | ORAL | Status: DC | PRN

## 2023-12-30 MED ORDER — ONDANSETRON HCL 4 MG/2ML IJ SOLN: 4.0000 mg | Freq: Four times a day (QID) | INTRAMUSCULAR | Status: DC | PRN

## 2023-12-30 MED ORDER — MIDAZOLAM HCL 5 MG/5ML IJ SOLN
INTRAMUSCULAR | Status: DC | PRN
  Administered 2023-12-30: 1 mg via INTRAVENOUS
  Administered 2023-12-30: 2 mg via INTRAVENOUS

## 2023-12-30 MED ORDER — LIDOCAINE HCL (PF) 1 % IJ SOLN
INTRAMUSCULAR | Status: AC
  Filled 2023-12-30: qty 30

## 2023-12-30 MED ORDER — TRAMADOL HCL 50 MG PO TABS
50.0000 mg | ORAL_TABLET | Freq: Once | ORAL | Status: AC
  Administered 2023-12-30: 50 mg via ORAL
  Filled 2023-12-30: qty 1

## 2023-12-30 MED ORDER — POVIDONE-IODINE 10 % EX SWAB
2.0000 | Freq: Once | CUTANEOUS | Status: AC
  Administered 2023-12-30: 2 via TOPICAL

## 2023-12-30 MED ORDER — FENTANYL CITRATE (PF) 100 MCG/2ML IJ SOLN
INTRAMUSCULAR | Status: DC | PRN
  Administered 2023-12-30 (×2): 25 ug via INTRAVENOUS

## 2023-12-30 MED ORDER — LIDOCAINE HCL (PF) 1 % IJ SOLN
INTRAMUSCULAR | Status: DC | PRN
  Administered 2023-12-30: 60 mL
  Administered 2023-12-30: 30 mL

## 2023-12-30 MED ORDER — SODIUM CHLORIDE 0.9 % IV SOLN: 80.0000 mg | INTRAVENOUS | Status: AC

## 2023-12-30 MED ORDER — MIDAZOLAM HCL 2 MG/2ML IJ SOLN
INTRAMUSCULAR | Status: AC
  Filled 2023-12-30: qty 2

## 2023-12-30 MED ORDER — CHLORHEXIDINE GLUCONATE 4 % EX SOLN: 4.0000 | Freq: Once | CUTANEOUS | Status: DC

## 2023-12-30 MED ORDER — SODIUM CHLORIDE 0.9 % IV SOLN
INTRAVENOUS | Status: AC
  Administered 2023-12-30: 80 mg
  Filled 2023-12-30: qty 2

## 2023-12-30 MED ORDER — CEFAZOLIN SODIUM-DEXTROSE 3-4 GM/150ML-% IV SOLN
3.0000 g | INTRAVENOUS | Status: AC
  Administered 2023-12-30: 3 g via INTRAVENOUS
  Filled 2023-12-30: qty 150

## 2023-12-30 SURGICAL SUPPLY — 8 items
CABLE SURGICAL S-101-97-12 (CABLE) ×1 IMPLANT
ICD COBALT XT VR DVPA2D4 (ICD Generator) IMPLANT
LEAD SPRINT QUAT SEC 6935M-62 (Lead) IMPLANT
PAD DEFIB RADIO PHYSIO CONN (PAD) ×1 IMPLANT
SHEATH 7FR PRELUDE SNAP 13 (SHEATH) IMPLANT
SHEATH 9FR PRELUDE SNAP 13 (SHEATH) IMPLANT
SHEATH PROBE COVER 6X72 (BAG) IMPLANT
TRAY PACEMAKER INSERTION (PACKS) ×1 IMPLANT

## 2023-12-30 NOTE — Discharge Instructions (Signed)
 After Your ICD (Implantable Cardiac Defibrillator)   You have a Medtronic ICD  If you have a Medtronic or Biotronik device, plug in your home monitor once you get home, and no manual interaction is required.   If you have an [employer] or [employer] device, plug your home monitor once you get home, sit near the device, and press the large activation button. Sit nearby until the process is complete, usually notated by lights on the monitor.   If you were set up for monitoring using an app on your phone, make sure the app remains open in the background and the Bluetooth remains on.  ACTIVITY Do not lift your arm above shoulder height for 1 week after your procedure. After 7 days, you may progress as below.  You should remove your sling 24 hours after your procedure, unless otherwise instructed by your provider.     Friday January 06, 2024  Saturday January 07, 2024 Sunday January 08, 2024 Monday January 09, 2024   Do not lift, push, pull, or carry anything over 10 pounds with the affected arm until 6 weeks (Friday February 10, 2024 ) after your procedure.   You may drive AFTER your wound check, unless you have been told otherwise by your provider.   Ask your healthcare provider when you can go back to work   INCISION/Dressing If you are on a blood thinner such as Coumadin, Xarelto, Eliquis, Plavix, or Pradaxa please confirm with your provider when this should be resumed.   If large square, outer bandage is left in place, this can be removed after 24 hours from your procedure. Do not remove steri-strips or glue as below.   Monitor your defibrillator site for redness, swelling, and drainage. Call the device clinic at [phone] if you experience these symptoms or fever/chills.  If your incision is sealed with Steri-strips or staples, you may shower 7 days after your procedure or when told by your provider. Do not remove the steri-strips or let the shower hit directly on your site.  You may wash around your site with soap and water .    If you were discharged in a sling, please do not wear this during the day more than 48 hours after your surgery unless otherwise instructed. This may increase the risk of stiffness and soreness in your shoulder.   Avoid lotions, ointments, or perfumes over your incision until it is well-healed.  You may use a hot tub or a pool AFTER your wound check appointment if the incision is completely closed.  Your ICD is designed to protect you from life threatening heart rhythms. Because of this, you may receive a shock.   1 shock with no symptoms:  Call the office during business hours. 1 shock with symptoms (chest pain, chest pressure, dizziness, lightheadedness, shortness of breath, overall feeling unwell):  Call 911. If you experience 2 or more shocks in 24 hours:  Call 911. If you receive a shock, you should not drive for 6 months per the [place] DMV IF you receive appropriate therapy from your ICD.   ICD Alerts:  Some alerts are vibratory and others beep. These are NOT emergencies. Please call our office to let us  know. If this occurs at night or on weekends, it can wait until the next business day. Send a remote transmission.  If your device is capable of reading fluid status (for heart failure), you will be offered monthly monitoring to review this with you.   DEVICE MANAGEMENT Remote monitoring  is used to monitor your ICD from home. This monitoring is scheduled every 91 days by our office. It allows us  to keep an eye on the functioning of your device to ensure it is working properly. You will routinely see your Electrophysiologist annually (more often if necessary). This will appear as a REMOTE check on your MyChart schedule. These are automatic and there is nothing for you to manually do unless otherwise instructed.  You should receive your ID card for your new device in 4-8 weeks. Keep this card with you at all times once received. Consider  wearing a medical alert bracelet or necklace.  Your ICD  may be MRI compatible. This will be discussed at your next office visit/wound check.  You should avoid contact with strong electric or magnetic fields.   Do not use amateur (ham) radio equipment or electric (arc) welding torches. MP3 player headphones with magnets should not be used. Some devices are safe to use if held at least 12 inches (30 cm) from your defibrillator. These include power tools, lawn mowers, and speakers. If you are unsure if something is safe to use, ask your health care provider.  When using your cell phone, hold it to the ear that is on the opposite side from the defibrillator. Do not leave your cell phone in a pocket over the defibrillator.  You may safely use electric blankets, heating pads, computers, and microwave ovens.  Call the office right away if: You have chest pain. You feel more than one shock. You feel more short of breath than you have felt before. You feel more light-headed than you have felt before. Your incision starts to open up.  This information is not intended to replace advice given to you by your health care provider. Make sure you discuss any questions you have with your health care provider.

## 2023-12-30 NOTE — H&P (Signed)
  Electrophysiology Office Note:   Date:  12/30/2023  ID:  [name] Sosa, DOB October 02, 1960, MRN [id number]  Primary Cardiologist: Jennifer JONELLE Crape, MD Primary Heart Failure: None Electrophysiologist: Lyonel Morejon Gladis Norton, MD      History of Present Illness:   [name] Sosa is a 62 y.o. male with h/o chronic systolic heart failure due to nonischemic cardiomyopathy, hypertension, sleep apnea, obesity seen today for  for Electrophysiology evaluation of heart failure at the request of Rajan Revankar.    Today, denies symptoms of palpitations, chest pain, dyspnea, orthopnea, PND, lower extremity edema, claudication, dizziness, presyncope, syncope, bleeding, or neurologic sequela. The patient is tolerating medications without difficulties. Plan ICD implant today.   EP Information / Studies Reviewed:    EKG is not ordered today. EKG from 08/19/2023 reviewed which showed sinus rhythm, LVH      Risk Assessment/Calculations:           Physical Exam:   VS:  BP 118/75   Pulse 92   Temp 97.9 F (36.6 C) (Oral)   Resp 20   Ht 6' 1 (1.854 m)   Wt (!) 147.4 kg   SpO2 99%   BMI 42.88 kg/m    Wt Readings from Last 3 Encounters:  12/30/23 (!) 147.4 kg  10/05/23 (!) 152.9 kg  08/19/23 (!) 156 kg    GEN: Well nourished, well developed in no acute distress NECK: No JVD; No carotid bruits CARDIAC: Regular rate and rhythm, no murmurs, rubs, gallops RESPIRATORY:  Clear to auscultation without rales, wheezing or rhonchi  ABDOMEN: Soft, non-tender, non-distended EXTREMITIES:  No edema; No deformity    ASSESSMENT AND PLAN:    1.  Chronic systolic heart failure: [name] Sosa has presented today for surgery, with the diagnosis of CHF.  The various methods of treatment have been discussed with the patient and family. After consideration of risks, benefits and other options for treatment, the patient has consented to  Procedure(s): ICD implant as a surgical intervention .  Risks include but  not limited to bleeding, infection, pneumothorax, perforation, tamponade, vascular damage, renal failure, MI, stroke, death, and lead dislodgement . The patient's history has been reviewed, patient examined, no change in status, stable for surgery.  I have reviewed the patient's chart and labs.  Questions were answered to the patient's satisfaction.    Akeira Lahm Norton, MD 12/30/2023 1:44 PM

## 2023-12-30 NOTE — Progress Notes (Signed)
 Discharge instructions reviewed with patient and wife at bedside. Denies questions or concern. Seen by the provider, device rep and X ray. No s/s of complications at incision site. Pt was able to void in the bathroom without difficulty. Tolerated PO intake no complaints of n/v. Pt escorted from unit via wheel chair to personal vehicle.

## 2024-01-02 ENCOUNTER — Telehealth: Payer: Self-pay | Admitting: Cardiology

## 2024-01-02 ENCOUNTER — Encounter (HOSPITAL_COMMUNITY): Payer: Self-pay | Admitting: Cardiology

## 2024-01-02 MED FILL — Midazolam HCl Inj PF 2 MG/2ML (Base Equivalent): INTRAMUSCULAR | Qty: 3 | Status: AC

## 2024-01-02 NOTE — Telephone Encounter (Signed)
 Spoke with Pt.  Per Pt he is having a NCS on his back and legs.  Advised per study found on NIH website, it does not appear that the study would cause his ICD to give therapy, but if there was any concern a magnet placed over his device would prevent therapy.  Pt indicates understanding and thanked nurse for call back.  Referenced:  Clinical Neurophysiology Volume 123, Issue 1, January 2012, Pages 211-213

## 2024-01-02 NOTE — Telephone Encounter (Signed)
  1. Has your device fired? no  2. Is you device beeping? no  3. Are you experiencing draining or swelling at device site? no  4. Are you calling to see if we received your device transmission? no  5. Have you passed out? No  Patient called because he is having a nerve conduction test and he wants to know if it will affect his ICD.  Please advise.    Please route to Device Clinic Pool

## 2024-01-11 ENCOUNTER — Ambulatory Visit: Attending: Cardiology | Admitting: *Deleted

## 2024-01-11 DIAGNOSIS — I5022 Chronic systolic (congestive) heart failure: Secondary | ICD-10-CM | POA: Diagnosis not present

## 2024-01-11 LAB — CUP PACEART INCLINIC DEVICE CHECK
Date Time Interrogation Session: 20251022132653
Implantable Lead Connection Status: 753985
Implantable Lead Implant Date: 20251010
Implantable Lead Location: 753860
Implantable Pulse Generator Implant Date: 20251010

## 2024-01-11 NOTE — Patient Instructions (Signed)

## 2024-01-11 NOTE — Progress Notes (Signed)
 Normal single chamber ICD wound check. Wound well healed. Presenting rhythm: VS. Routine testing performed. Thresholds, sensing, and impedance consistent with implant measurements with 3.5V safety margin/auto capture until 3 month visit. No treated arrhythmias. Reviewed arm restrictions to continue for 6 weeks total post op. Reviewed shock plan.  Pt enrolled in remote follow-up.

## 2024-01-23 DIAGNOSIS — M79605 Pain in left leg: Secondary | ICD-10-CM | POA: Diagnosis not present

## 2024-01-23 DIAGNOSIS — M6281 Muscle weakness (generalized): Secondary | ICD-10-CM | POA: Diagnosis not present

## 2024-01-23 DIAGNOSIS — R2689 Other abnormalities of gait and mobility: Secondary | ICD-10-CM | POA: Diagnosis not present

## 2024-01-31 DIAGNOSIS — E119 Type 2 diabetes mellitus without complications: Secondary | ICD-10-CM | POA: Diagnosis not present

## 2024-02-02 DIAGNOSIS — R2689 Other abnormalities of gait and mobility: Secondary | ICD-10-CM | POA: Diagnosis not present

## 2024-02-02 DIAGNOSIS — M79605 Pain in left leg: Secondary | ICD-10-CM | POA: Diagnosis not present

## 2024-02-02 DIAGNOSIS — M6281 Muscle weakness (generalized): Secondary | ICD-10-CM | POA: Diagnosis not present

## 2024-02-07 DIAGNOSIS — L03115 Cellulitis of right lower limb: Secondary | ICD-10-CM | POA: Diagnosis not present

## 2024-02-10 ENCOUNTER — Ambulatory Visit (INDEPENDENT_AMBULATORY_CARE_PROVIDER_SITE_OTHER)

## 2024-02-10 DIAGNOSIS — I5022 Chronic systolic (congestive) heart failure: Secondary | ICD-10-CM | POA: Diagnosis not present

## 2024-02-10 DIAGNOSIS — M545 Low back pain, unspecified: Secondary | ICD-10-CM | POA: Diagnosis not present

## 2024-02-10 DIAGNOSIS — G8929 Other chronic pain: Secondary | ICD-10-CM | POA: Diagnosis not present

## 2024-02-10 DIAGNOSIS — M4807 Spinal stenosis, lumbosacral region: Secondary | ICD-10-CM | POA: Diagnosis not present

## 2024-02-10 DIAGNOSIS — M48062 Spinal stenosis, lumbar region with neurogenic claudication: Secondary | ICD-10-CM | POA: Diagnosis not present

## 2024-02-10 DIAGNOSIS — M5416 Radiculopathy, lumbar region: Secondary | ICD-10-CM | POA: Diagnosis not present

## 2024-02-10 DIAGNOSIS — G629 Polyneuropathy, unspecified: Secondary | ICD-10-CM | POA: Diagnosis not present

## 2024-02-10 DIAGNOSIS — R29818 Other symptoms and signs involving the nervous system: Secondary | ICD-10-CM | POA: Diagnosis not present

## 2024-02-10 LAB — CUP PACEART REMOTE DEVICE CHECK
Battery Remaining Longevity: 169 mo
Battery Voltage: 3.16 V
Brady Statistic RA Percent Paced: INVALID
Brady Statistic RV Percent Paced: 0 %
Date Time Interrogation Session: 20251120182007
HighPow Impedance: 57 Ohm
Implantable Lead Connection Status: 753985
Implantable Lead Implant Date: 20251010
Implantable Lead Location: 753860
Implantable Pulse Generator Implant Date: 20251010
Lead Channel Impedance Value: 247 Ohm
Lead Channel Impedance Value: 323 Ohm
Lead Channel Pacing Threshold Amplitude: 0.875 V
Lead Channel Pacing Threshold Pulse Width: 0.4 ms
Lead Channel Sensing Intrinsic Amplitude: 9.3 mV
Lead Channel Setting Pacing Amplitude: 3.5 V
Lead Channel Setting Pacing Pulse Width: 0.4 ms
Lead Channel Setting Sensing Sensitivity: 0.3 mV
Zone Setting Status: 755011

## 2024-02-13 NOTE — Progress Notes (Signed)
 Remote ICD Transmission

## 2024-03-08 DIAGNOSIS — E1122 Type 2 diabetes mellitus with diabetic chronic kidney disease: Secondary | ICD-10-CM | POA: Diagnosis not present

## 2024-03-08 DIAGNOSIS — N1832 Chronic kidney disease, stage 3b: Secondary | ICD-10-CM | POA: Diagnosis not present

## 2024-03-08 DIAGNOSIS — G4733 Obstructive sleep apnea (adult) (pediatric): Secondary | ICD-10-CM | POA: Diagnosis not present

## 2024-03-08 DIAGNOSIS — I429 Cardiomyopathy, unspecified: Secondary | ICD-10-CM | POA: Diagnosis not present

## 2024-03-09 ENCOUNTER — Other Ambulatory Visit: Payer: Self-pay | Admitting: Cardiology

## 2024-03-30 ENCOUNTER — Ambulatory Visit: Attending: Physician Assistant | Admitting: Physician Assistant

## 2024-03-30 VITALS — BP 96/60 | HR 84 | Ht 73.0 in | Wt 330.0 lb

## 2024-03-30 DIAGNOSIS — Z95810 Presence of automatic (implantable) cardiac defibrillator: Secondary | ICD-10-CM

## 2024-03-30 DIAGNOSIS — I428 Other cardiomyopathies: Secondary | ICD-10-CM

## 2024-03-30 DIAGNOSIS — I5022 Chronic systolic (congestive) heart failure: Secondary | ICD-10-CM | POA: Diagnosis not present

## 2024-03-30 LAB — CUP PACEART INCLINIC DEVICE CHECK
Date Time Interrogation Session: 20260109143927
Implantable Lead Connection Status: 753985
Implantable Lead Implant Date: 20251010
Implantable Lead Location: 753860
Implantable Pulse Generator Implant Date: 20251010

## 2024-03-30 NOTE — Patient Instructions (Addendum)
 Medication Instructions:   Your physician recommends that you continue on your current medications as directed. Please refer to the Current Medication list given to you today.   *If you need a refill on your cardiac medications before your next appointment, please call your pharmacy*   Lab Work: NONE ORDERED  TODAY    If you have labs (blood work) drawn today and your tests are completely normal, you will receive your results only by: MyChart Message (if you have MyChart) OR A paper copy in the mail If you have any lab test that is abnormal or we need to change your treatment, we will call you to review the results.    Testing/Procedures:  NONE ORDERED  TODAY     Follow-Up: At [facility], you and your health needs are our priority.  As part of our continuing mission to provide you with exceptional heart care, our providers are all part of one team.  This team includes your primary Cardiologist (physician) and Advanced Practice Providers or APPs (Physician Assistants and Nurse Practitioners) who all work together to provide you with the care you need, when you need it.  Your next appointment:   Dr. Kerin  1 to 2 months  [place]   and   11 month(s)  Provider:  Soyla Norton, MD or Charlies Arthur, PA-C     We recommend signing up for the patient portal called MyChart.  Sign up information is provided on this After Visit Summary.  MyChart is used to connect with patients for Virtual Visits (Telemedicine).  Patients are able to view lab/test results, encounter notes, upcoming appointments, etc.  Non-urgent messages can be sent to your provider as well.   To learn more about what you can do with MyChart, go to [email or web address].   Other Instructions

## 2024-03-30 NOTE — Progress Notes (Signed)
" °  Cardiology Office Note:  .   Date:  03/30/2024  ID:  [name] Sosa, DOB 07/31/1960, MRN [id number] PCP: Charlott Dorn LABOR, MD  [facility] Providers Cardiologist:  Jennifer JONELLE Crape, MD Electrophysiologist:  Soyla Gladis Norton, MD {  History of Present Illness: .   [name] Sosa is a 64 y.o. male w/PMHx of  HTN, OSA, DM, CKD (III), hypothyroidism, HLD NHL NICM, chronic CHF  He saw Dr. Norton 10/05/23, discussed prior recs for ICD though he was not quite on board, revisiting it at this visit, he again, was going to give it some thought/discuss with his family. 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 Denies any difficulties with his ADLs No dizzy spells, near syncope or syncope  Initial BP 87/53, denied any kind of symptoms Repeat manually by myself 90/60 right 96/62 left  Device information MDT single chamber ICD implanted 12/30/23 Primary prevention  Arrhythmia/AAD hx None to date  Studies Reviewed: SABRA    EKG not done today  DEVICE interrogation done today and reviewed by myself Battery and lead measurements are good No arrhythmias OptiVol looks good (low) Alert for wavelet amplitude to high EGM 2 adjusted to +/- 8 with same alert > +/- 16 with successful template, match scores 97-100% ( a couple 94%   10/06/23: TTE  1. Left ventricular ejection fraction, by estimation, is <20%. The left  ventricle has severely decreased function. The left ventricle demonstrates  global hypokinesis. The left ventricular internal cavity size was severely  dilated. Left ventricular  diastolic parameters are consistent with Grade II diastolic dysfunction  (pseudonormalization).   2. Right ventricular systolic  function is normal. The right ventricular  size is normal.   3. The mitral valve is normal in structure. Mild mitral valve  regurgitation.   4. The aortic valve is normal in structure. Aortic valve regurgitation is  not visualized.   5. Aortic dilatation noted. There is borderline dilatation of the aortic  root, measuring 38 mm.    Risk Assessment/Calculations:    Physical Exam:   VS:  There were no vitals taken for this visit.   Wt Readings from Last 3 Encounters:  12/30/23 (!) 325 lb (147.4 kg)  10/05/23 (!) 337 lb (152.9 kg)  08/19/23 (!) 344 lb (156 kg)    GEN: Well nourished, well developed in no acute distress NECK: No JVD; No carotid bruits CARDIAC: RRR, no murmurs, rubs, gallops RESPIRATORY:  CTA b/l without rales, wheezing or rhonchi  ABDOMEN: Soft, non-tender, non-distended EXTREMITIES: No edema; No deformity   ICD site: is stable, no thinning, fluctuation, tethering  ASSESSMENT AND PLAN: .    ICD intact function New template aquired  NICM Chronic CHF BP on the low side today He reports at home generally 110's/70's, no symptoms of hypotension Advised he stay adequately hydrated C/w Dr. Gattis >> due to see him next month  Dispo: remotes as usual, in clinic in a year, sooner if needed  Signed, Charlies Macario Arthur, PA-C   "

## 2024-04-17 ENCOUNTER — Telehealth: Payer: Self-pay | Admitting: Cardiology

## 2024-04-17 ENCOUNTER — Encounter: Payer: Self-pay | Admitting: Cardiology

## 2024-04-17 ENCOUNTER — Ambulatory Visit: Attending: Cardiology | Admitting: Cardiology

## 2024-04-17 ENCOUNTER — Other Ambulatory Visit: Payer: Self-pay

## 2024-04-17 VITALS — BP 110/76 | HR 101 | Ht 73.0 in | Wt 327.8 lb

## 2024-04-17 DIAGNOSIS — I428 Other cardiomyopathies: Secondary | ICD-10-CM

## 2024-04-17 DIAGNOSIS — I10 Essential (primary) hypertension: Secondary | ICD-10-CM

## 2024-04-17 DIAGNOSIS — G4730 Sleep apnea, unspecified: Secondary | ICD-10-CM | POA: Diagnosis not present

## 2024-04-17 DIAGNOSIS — R0602 Shortness of breath: Secondary | ICD-10-CM | POA: Diagnosis not present

## 2024-04-17 NOTE — Telephone Encounter (Signed)
 Spoke with pt who reports he has shortness of breath when he goes up stairs. Denies weight gain. Appointment made with Dr. Edwyna today.

## 2024-04-17 NOTE — Telephone Encounter (Signed)
 Pt c/o Shortness Of Breath: STAT if SOB developed within the last 24 hours or pt is noticeably SOB on the phone  1. Are you currently SOB (can you hear that pt is SOB on the phone)? No   2. How long have you been experiencing SOB? Starting Saturday 04/14/24   3. Are you SOB when sitting or when up moving around? Moving around   4. Are you currently experiencing any other symptoms? Coughing

## 2024-04-17 NOTE — Progress Notes (Signed)
 " Cardiology Office Note:    Date:  04/17/2024   ID:  [name] Sosa, DOB March 22, 1961, MRN [id number]  PCP:  Charlott Dorn LABOR, MD  Cardiologist:  Jennifer JONELLE Crape, MD   Referring MD: Charlott Dorn LABOR, MD    ASSESSMENT:    1. SOB (shortness of breath)   2. Cardiomyopathy, nonischemic (HCC)   3. Primary hypertension   4. Sleep apnea, unspecified type   5. Morbid obesity (HCC)    PLAN:    In order of problems listed above:  Primary prevention stressed with the patient.  Importance of compliance with diet medication stressed and patient verbalized standing. History of cardiomyopathy and congestive heart failure: CHF education was given to the patient.  He was advised to check his weight on a regular basis.  Salt intake issues were discussed. Post ICD insertion: Followed by electrophysiology colleagues.  Their notes were reviewed. We will do an echocardiogram before the next visit to assess and follow-up. Essential hypertension: Blood pressure is stable and diet was emphasized.  Lifestyle modification urged.   In view of multiple medications he will hypertensive today. Obesity: Weight reduction stressed diet emphasized and he promises to do better. Patient will be seen in follow-up appointment in 6 months or earlier if the patient has any concerns.    Medication Adjustments/Labs and Tests Ordered: Current medicines are reviewed at length with the patient today.  Concerns regarding medicines are outlined above.  Orders Placed This Encounter  Procedures   Basic Metabolic Panel (BMET)   EKG 12-Lead   ECHOCARDIOGRAM COMPLETE   No orders of the defined types were placed in this encounter.    No chief complaint on file.    History of Present Illness:    [name] Sosa is a 64 y.o. male.  Patient has past medical history of nonischemic cardiomyopathy, essential hypertension, mixed dyslipidemia, sleep apnea and morbid obesity.  He is post defibrillator insertion and  followed by electrophysiology.  He denies any chest pain orthopnea or PND.  At the time of my evaluation, the patient is alert awake oriented and in no distress.  Past Medical History:  Diagnosis Date   Adenomatous polyp of colon 05/14/2020   Anemia    Anxiety    HX OF ANXIETY ATTACKS--CAN'T SLEEP ON BACK-FEELS LIKE HE CAN'T BREATHE   Apnea, sleep    SEVERE OSA PER STUDY 12/01/12   Arthritis    Bariatric surgery status 05/14/2020   Body mass index (BMI) 45.0-49.9, adult (HCC) 05/14/2020   Cancer (HCC)    HX OF NON HODGKIN'S LYMPHOMA -IN REMISSION   Cardiomyopathy (HCC) 04/27/2018   Cardiomyopathy, nonischemic (HCC)    DX 2003 CARDIAC CATH, ECHO 2006 SHOWED EF IMPROVED TO 45-50%--PER CARDIOLOGY OFFICE NOTES DR. HILARIO SHARPS FROM 01/20/10   CHF (congestive heart failure) (HCC) 2003   Chronic kidney disease    HX OF CHRONIC KIDNEY DISEASE STAGE III - EVALUATED AT [facility] IN THE PAST AND FELT TO BE RELATED TO NSAID'S AND CHEMO.  PT  STATES HE HAS NOT SEEN KIDNEY SPECIALIST AT [facility] IN OVER A YEAR.   Chronic rhinitis 05/14/2020   Chronic systolic heart failure (HCC)    CARDIOLOGIST IS DR. HILARIO SHARPS   CKD (chronic kidney disease), stage III (HCC) 12/08/2012   Claustrophobia    Complication of anesthesia    had muscle weakness and body aches the day after the surgery   Diabetes (HCC) 04/27/2012   Diabetic renal disease (HCC) 05/14/2020   Diffuse large  cell lymphoma in remission (HCC) 04/27/2012   Gastro-esophageal reflux disease without esophagitis 05/14/2020   GERD (gastroesophageal reflux disease)    NOT TAKING ANY MEDS FOR REFLUX   History of adenomatous polyp of colon 05/14/2020   History of Lap Roux-en-Y gastric bypass 03/05/13 03/23/2013   History of lobectomy of thyroid  11/06/2021   Hydrocele 05/14/2020   Hyperlipidemia    Hypertension    IDDM (insulin  dependent diabetes mellitus) 12/08/2012   Long term (current) use of insulin  (HCC) 05/14/2020   Lumbar spondylosis 04/02/2021    Morbid obesity (HCC)    Multinodular goiter (nontoxic) 10/16/2021   Multiple thyroid  nodules    HX OF NODULES=PT STATES BIOPSY-TOLD ALL OKAY   Non-Hodgkin lymphoma (HCC)    DX ABOUT 2007-TX'D WITH CHEMO-IN REMISSION   Nonischemic cardiomyopathy (HCC) 11/25/2017   Obesity 05/14/2020   Obstructive sleep apnea 12/08/2012   Primary localized osteoarthritis of pelvic region and thigh 04/02/2021   Primary localized osteoarthritis of right hip 12/29/2021   Primary osteoarthritis of right hip 12/29/2021   Pure hypercholesterolemia 05/14/2020   Sciatica, right side 05/14/2020   Shortness of breath    WALKING UPSTAIRS   Skin sensation disturbance 05/14/2020   Subclinical hypothyroidism 05/14/2020   Substernal thyroid  goiter 10/08/2021   Testicular hypofunction 05/14/2020   Thyroid  nodule 05/14/2020   Tracheal deviation 10/08/2021    Past Surgical History:  Procedure Laterality Date   BIOPSY  09/25/2019   Procedure: BIOPSY;  Surgeon: Elicia Claw, MD;  Location: [facility];  Service: Gastroenterology;;   BIOPSY  05/03/2023   Procedure: BIOPSY;  Surgeon: Elicia Claw, MD;  Location: [facility];  Service: Gastroenterology;;   CARDIAC CATHETERIZATION     COLONOSCOPY WITH PROPOFOL  N/A 01/09/2013   Procedure: COLONOSCOPY WITH PROPOFOL ;  Surgeon: Gladis MARLA Louder, MD;  Location: [facility];  Service: Endoscopy;  Laterality: N/A;   COLONOSCOPY WITH PROPOFOL  N/A 09/25/2019   Procedure: COLONOSCOPY WITH PROPOFOL ;  Surgeon: Elicia Claw, MD;  Location: [facility];  Service: Gastroenterology;  Laterality: N/A;   COLONOSCOPY WITH PROPOFOL  N/A 05/03/2023   Procedure: COLONOSCOPY WITH PROPOFOL ;  Surgeon: Elicia Claw, MD;  Location: [facility];  Service: Gastroenterology;  Laterality: N/A;   GASTRIC ROUX-EN-Y N/A 03/05/2013   Procedure: LAPAROSCOPIC ROUX-EN-Y GASTRIC BYPASS WITH UPPER ENDOSCOPY;  Surgeon: Camellia CHRISTELLA Blush, MD;  Location: [facility];  Service: General;  Laterality:  N/A;   HYDROCELE EXCISION  02/09/2011   Procedure: HYDROCELECTOMY ADULT;  Surgeon: Donnice Gwenyth Mchargue, MD;  Location: [facility];  Service: Urology;  Laterality: Right;   ICD IMPLANT N/A 12/30/2023   Procedure: ICD IMPLANT;  Surgeon: Inocencio Soyla Gladis, MD;  Location[facility];  Service: Cardiovascular;  Laterality: N/A;   POLYPECTOMY  09/25/2019   Procedure: POLYPECTOMY;  Surgeon: Elicia Claw, MD;  Location: [facility];  Service: Gastroenterology;;   POLYPECTOMY  05/03/2023   Procedure: POLYPECTOMY;  Surgeon: Elicia Claw, MD;  Location: [facility];  Service: Gastroenterology;;   THYROID  LOBECTOMY N/A 10/16/2021   Procedure: LEFT THYROID  LOBECTOMY;  Surgeon: Eletha Boas, MD;  Location: [facility];  Service: General;  Laterality: N/A;   TOTAL HIP ARTHROPLASTY Right 12/29/2021   Procedure: RIGHT TOTAL HIP ARTHROPLASTY ANTERIOR APPROACH;  Surgeon: Sheril Coy, MD;  Location: [facility];  Service: Orthopedics;  Laterality: Right;   WISDOM TOOTH EXTRACTION      Current Medications: Active Medications[1]   Allergies:   Aciphex [rabeprazole sodium], Nsaids, and Rosuvastatin   Social History   Socioeconomic History   Marital status: Single  Spouse name: Not on file   Number of children: Not on file   Years of education: Not on file   Highest education level: Not on file  Occupational History   Not on file  Tobacco Use   Smoking status: Never   Smokeless tobacco: Never  Vaping Use   Vaping status: Never Used  Substance and Sexual Activity   Alcohol use: Yes    Comment: RARELY   Drug use: No   Sexual activity: Not Currently  Other Topics Concern   Not on file  Social History Narrative   Not on file   Social Drivers of Health   Tobacco Use: Low Risk (04/17/2024)   Patient History    Smoking Tobacco Use: Never    Smokeless Tobacco Use: Never    Passive Exposure: Not on file  Financial Resource Strain: Not on file  Food Insecurity: No Food  Insecurity (12/29/2021)   Hunger Vital Sign    Worried About Running Out of Food in the Last Year: Never true    Ran Out of Food in the Last Year: Never true  Transportation Needs: No Transportation Needs (12/29/2021)   PRAPARE - Administrator, Civil Service (Medical): No    Lack of Transportation (Non-Medical): No  Physical Activity: Not on file  Stress: Not on file  Social Connections: Not on file  Depression (EYV7-0): Not on file  Alcohol Screen: Not on file  Housing: Unknown (11/15/2023)   Received from [facility]   Epic    Unable to Pay for Housing in the Last Year: Not on file    Number of Times Moved in the Last Year: Not on file    At any time in the past 12 months, were you homeless or living in a shelter (including now)?: No  Utilities: Not At Risk (12/29/2021)   [facility] Utilities    Threatened with loss of utilities: No  Health Literacy: Not on file     Family History: The patient's He was adopted. Family history is unknown by patient.  ROS:   Please see the history of present illness.    All other systems reviewed and are negative.  EKGs/Labs/Other Studies Reviewed:    The following studies were reviewed today: .SABRAEKG Interpretation Date/Time:  Tuesday April 17 2024 15:12:08 EST Ventricular Rate:  101 PR Interval:  162 QRS Duration:  86 QT Interval:  344 QTC Calculation: 446 R Axis:   -36  Text Interpretation: Sinus tachycardia Possible Left atrial enlargement Left axis deviation When compared with ECG of 30-Dec-2023 17:37, Nonspecific T wave abnormality no longer evident in Inferior leads Confirmed by Edwyna Backers [phone]) on 04/17/2024 3:43:13 PM     Recent Labs: 08/26/2023: ALT 19; TSH 1.190 12/16/2023: BUN 18; Creatinine, Ser 2.13; Hemoglobin 13.6; Platelets 211; Potassium 5.2; Sodium 139  Recent Lipid Panel    Component Value Date/Time   CHOL 163 08/26/2023 0721   TRIG 68 08/26/2023 0721   HDL 73 08/26/2023 0721    CHOLHDL 2.2 08/26/2023 0721   CHOLHDL 3.2 08/07/2013 0815   VLDL 19 08/07/2013 0815   LDLCALC 77 08/26/2023 0721    Physical Exam:    VS:  BP 110/76   Pulse (!) 101   Ht 6' 1 (1.854 m)   Wt (!) 327 lb 12.8 oz (148.7 kg)   SpO2 96%   BMI 43.25 kg/m     Wt Readings from Last 3 Encounters:  04/17/24 (!) 327 lb 12.8 oz (148.7  kg)  03/30/24 (!) 330 lb (149.7 kg)  12/30/23 (!) 325 lb (147.4 kg)     GEN: Patient is in no acute distress HEENT: Normal NECK: No JVD; No carotid bruits LYMPHATICS: No lymphadenopathy CARDIAC: Hear sounds regular, 2/6 systolic murmur at the apex. RESPIRATORY:  Clear to auscultation without rales, wheezing or rhonchi  ABDOMEN: Soft, non-tender, non-distended MUSCULOSKELETAL:  No edema; No deformity  SKIN: Warm and dry NEUROLOGIC:  Alert and oriented x 3 PSYCHIATRIC:  Normal affect   Signed, Jennifer JONELLE Crape, MD  04/17/2024 3:55 PM     [facility]     [1]  Current Meds  Medication Sig   acetaminophen  (TYLENOL ) 650 MG CR tablet Take 1,300 mg by mouth every 8 (eight) hours as needed for pain.   ALPRAZolam  (XANAX ) 0.5 MG tablet Take 0.5 mg by mouth 2 (two) times daily as needed for anxiety.   aspirin  EC 81 MG tablet Take 81 mg by mouth daily. Swallow whole.   CALCIUM CITRATE PO Take 2 tablets by mouth 2 (two) times daily.   carvedilol  (COREG ) 25 MG tablet Take 1 tablet (25 mg total) by mouth 2 (two) times daily.   FARXIGA  10 MG TABS tablet Take 10 mg by mouth daily.   HUMALOG KWIKPEN 200 UNIT/ML KwikPen Inject 20-25 Units into the skin 3 (three) times daily with meals. 20 units sq   Multiple Vitamins-Minerals (BARIATRIC MULTIVITAMINS/IRON PO) Take 1 capsule by mouth daily.   sacubitril -valsartan  (ENTRESTO ) 97-103 MG Take 1 tablet by mouth 2 (two) times daily.   sildenafil (REVATIO) 20 MG tablet Take 20 mg by mouth.   SOLIQUA 100-33 UNT-MCG/ML SOPN Inject 30 Units into the skin at bedtime.   tirzepatide (MOUNJARO) 15 MG/0.5ML  Pen Inject 15 mg into the skin once a week.   zolpidem  (AMBIEN ) 10 MG tablet Take 10 mg by mouth at bedtime.   "

## 2024-04-17 NOTE — Patient Instructions (Signed)
 Medication Instructions:  Your physician recommends that you continue on your current medications as directed. Please refer to the Current Medication list given to you today.  *If you need a refill on your cardiac medications before your next appointment, please call your pharmacy*  Lab Work: Your physician recommends that you return for lab work in:   Labs today: BMP, Pro BNP  If you have labs (blood work) drawn today and your tests are completely normal, you will receive your results only by: MyChart Message (if you have MyChart) OR A paper copy in the mail If you have any lab test that is abnormal or we need to change your treatment, we will call you to review the results.  Testing/Procedures: Your physician has requested that you have an echocardiogram. Echocardiography is a painless test that uses sound waves to create images of your heart. It provides your doctor with information about the size and shape of your heart and how well your hearts chambers and valves are working. This procedure takes approximately one hour. There are no restrictions for this procedure. Please do NOT wear cologne, perfume, aftershave, or lotions (deodorant is allowed). Please arrive 15 minutes prior to your appointment time.  Please note: We ask at that you not bring children with you during ultrasound (echo/ vascular) testing. Due to room size and safety concerns, children are not allowed in the ultrasound rooms during exams. Our front office staff cannot provide observation of children in our lobby area while testing is being conducted. An adult accompanying a patient to their appointment will only be allowed in the ultrasound room at the discretion of the ultrasound technician under special circumstances. We apologize for any inconvenience.   Follow-Up: At [facility], you and your health needs are our priority.  As part of our continuing mission to provide you with exceptional heart care, our  providers are all part of one team.  This team includes your primary Cardiologist (physician) and Advanced Practice Providers or APPs (Physician Assistants and Nurse Practitioners) who all work together to provide you with the care you need, when you need it.  Your next appointment:   6 month(s)  Provider:   Jennifer Crape, MD    We recommend signing up for the patient portal called MyChart.  Sign up information is provided on this After Visit Summary.  MyChart is used to connect with patients for Virtual Visits (Telemedicine).  Patients are able to view lab/test results, encounter notes, upcoming appointments, etc.  Non-urgent messages can be sent to your provider as well.   To learn more about what you can do with MyChart, go to [email or web address].   Other Instructions None

## 2024-04-18 ENCOUNTER — Ambulatory Visit: Payer: Self-pay | Admitting: Cardiology

## 2024-04-18 ENCOUNTER — Ambulatory Visit: Admitting: Cardiology

## 2024-04-18 LAB — BASIC METABOLIC PANEL WITH GFR
BUN/Creatinine Ratio: 12 (ref 10–24)
BUN: 25 mg/dL (ref 8–27)
CO2: 21 mmol/L (ref 20–29)
Calcium: 9 mg/dL (ref 8.6–10.2)
Chloride: 104 mmol/L (ref 96–106)
Creatinine, Ser: 2.14 mg/dL — ABNORMAL HIGH (ref 0.76–1.27)
Glucose: 131 mg/dL — ABNORMAL HIGH (ref 70–99)
Potassium: 5.2 mmol/L (ref 3.5–5.2)
Sodium: 137 mmol/L (ref 134–144)
eGFR: 34 mL/min/{1.73_m2} — ABNORMAL LOW

## 2024-04-18 LAB — PRO B NATRIURETIC PEPTIDE: NT-Pro BNP: 2039 pg/mL — ABNORMAL HIGH (ref 0–376)

## 2024-04-25 ENCOUNTER — Encounter: Payer: Self-pay | Admitting: Cardiology

## 2024-04-26 ENCOUNTER — Other Ambulatory Visit: Payer: Self-pay

## 2024-04-26 MED ORDER — SACUBITRIL-VALSARTAN 49-51 MG PO TABS: 1.0000 | ORAL_TABLET | Freq: Two times a day (BID) | ORAL | Status: AC

## 2024-05-10 ENCOUNTER — Ambulatory Visit (HOSPITAL_BASED_OUTPATIENT_CLINIC_OR_DEPARTMENT_OTHER)

## 2024-05-11 ENCOUNTER — Encounter

## 2024-08-10 ENCOUNTER — Encounter

## 2024-11-09 ENCOUNTER — Encounter

## 2025-02-08 ENCOUNTER — Encounter
